# Patient Record
Sex: Male | Born: 1950 | Race: Black or African American | Hispanic: No | Smoking: Current every day smoker
Health system: Southern US, Community
[De-identification: ages and names within clinical notes are randomized; demographics above are authoritative.]

## PROBLEM LIST (undated history)

## (undated) DIAGNOSIS — F0390 Unspecified dementia without behavioral disturbance: Secondary | ICD-10-CM

## (undated) DIAGNOSIS — I10 Essential (primary) hypertension: Secondary | ICD-10-CM

## (undated) DIAGNOSIS — G40909 Epilepsy, unspecified, not intractable, without status epilepticus: Secondary | ICD-10-CM

## (undated) DIAGNOSIS — E119 Type 2 diabetes mellitus without complications: Secondary | ICD-10-CM

---

## 2009-02-11 ENCOUNTER — Emergency Department (HOSPITAL_COMMUNITY): Admission: EM | Admit: 2009-02-11 | Discharge: 2009-02-11 | Payer: Self-pay | Admitting: Family Medicine

## 2009-03-24 ENCOUNTER — Emergency Department (HOSPITAL_COMMUNITY): Admission: EM | Admit: 2009-03-24 | Discharge: 2009-03-24 | Payer: Self-pay | Admitting: Family Medicine

## 2009-08-23 ENCOUNTER — Ambulatory Visit: Payer: Self-pay | Admitting: Gastroenterology

## 2009-09-03 ENCOUNTER — Ambulatory Visit: Payer: Self-pay | Admitting: Gastroenterology

## 2013-01-13 ENCOUNTER — Ambulatory Visit: Payer: Medicare Other | Attending: Internal Medicine | Admitting: Rehabilitation

## 2014-07-23 ENCOUNTER — Encounter: Payer: Self-pay | Admitting: Gastroenterology

## 2014-08-11 ENCOUNTER — Encounter: Payer: Self-pay | Admitting: Gastroenterology

## 2014-10-22 DIAGNOSIS — M160 Bilateral primary osteoarthritis of hip: Secondary | ICD-10-CM | POA: Insufficient documentation

## 2015-05-25 ENCOUNTER — Encounter (HOSPITAL_COMMUNITY): Payer: Self-pay | Admitting: Emergency Medicine

## 2015-05-25 ENCOUNTER — Inpatient Hospital Stay (HOSPITAL_COMMUNITY)
Admission: EM | Admit: 2015-05-25 | Discharge: 2015-05-27 | DRG: 101 | Attending: Internal Medicine | Admitting: Internal Medicine

## 2015-05-25 ENCOUNTER — Observation Stay (HOSPITAL_COMMUNITY)

## 2015-05-25 ENCOUNTER — Inpatient Hospital Stay (HOSPITAL_COMMUNITY)
Admit: 2015-05-25 | Discharge: 2015-05-25 | Disposition: A | Discharge status: Home / Self Care | Attending: Internal Medicine | Admitting: Internal Medicine

## 2015-05-25 ENCOUNTER — Emergency Department (HOSPITAL_COMMUNITY)

## 2015-05-25 DIAGNOSIS — R410 Disorientation, unspecified: Secondary | ICD-10-CM | POA: Insufficient documentation

## 2015-05-25 DIAGNOSIS — I10 Essential (primary) hypertension: Secondary | ICD-10-CM | POA: Diagnosis present

## 2015-05-25 DIAGNOSIS — Z8673 Personal history of transient ischemic attack (TIA), and cerebral infarction without residual deficits: Secondary | ICD-10-CM

## 2015-05-25 DIAGNOSIS — E876 Hypokalemia: Secondary | ICD-10-CM | POA: Diagnosis present

## 2015-05-25 DIAGNOSIS — F17210 Nicotine dependence, cigarettes, uncomplicated: Secondary | ICD-10-CM | POA: Diagnosis present

## 2015-05-25 DIAGNOSIS — R569 Unspecified convulsions: Secondary | ICD-10-CM | POA: Diagnosis not present

## 2015-05-25 DIAGNOSIS — F4323 Adjustment disorder with mixed anxiety and depressed mood: Secondary | ICD-10-CM | POA: Diagnosis present

## 2015-05-25 DIAGNOSIS — K118 Other diseases of salivary glands: Secondary | ICD-10-CM | POA: Diagnosis present

## 2015-05-25 DIAGNOSIS — E119 Type 2 diabetes mellitus without complications: Secondary | ICD-10-CM | POA: Diagnosis present

## 2015-05-25 DIAGNOSIS — E538 Deficiency of other specified B group vitamins: Secondary | ICD-10-CM | POA: Diagnosis present

## 2015-05-25 DIAGNOSIS — G9340 Encephalopathy, unspecified: Secondary | ICD-10-CM | POA: Diagnosis present

## 2015-05-25 HISTORY — DX: Essential (primary) hypertension: I10

## 2015-05-25 HISTORY — DX: Type 2 diabetes mellitus without complications: E11.9

## 2015-05-25 LAB — COMPREHENSIVE METABOLIC PANEL
ALT: 11 U/L — ABNORMAL LOW (ref 17–63)
ANION GAP: 8 (ref 5–15)
AST: 16 U/L (ref 15–41)
Albumin: 4.1 g/dL (ref 3.5–5.0)
Alkaline Phosphatase: 65 U/L (ref 38–126)
BUN: 16 mg/dL (ref 6–20)
CHLORIDE: 101 mmol/L (ref 101–111)
CO2: 26 mmol/L (ref 22–32)
CREATININE: 0.95 mg/dL (ref 0.61–1.24)
Calcium: 9 mg/dL (ref 8.9–10.3)
GFR calc non Af Amer: >60 mL/min (ref >60)
Glucose, Bld: 177 mg/dL — ABNORMAL HIGH (ref 65–99)
Potassium: 4.3 mmol/L (ref 3.5–5.1)
Sodium: 135 mmol/L (ref 135–145)
Total Bilirubin: 0.6 mg/dL (ref 0.3–1.2)
Total Protein: 7.8 g/dL (ref 6.5–8.1)

## 2015-05-25 LAB — CBG MONITORING, ED: GLUCOSE-CAPILLARY: 167 mg/dL — AB (ref 65–99)

## 2015-05-25 LAB — CBC
HCT: 41.5 % (ref 39.0–52.0)
Hemoglobin: 14.1 g/dL (ref 13.0–17.0)
MCH: 32.3 pg (ref 26.0–34.0)
MCHC: 34 g/dL (ref 30.0–36.0)
MCV: 95 fL (ref 78.0–100.0)
PLATELETS: 190 10*3/uL (ref 150–400)
RBC: 4.37 MIL/uL (ref 4.22–5.81)
RDW: 11.9 % (ref 11.5–15.5)
WBC: 4.2 10*3/uL (ref 4.0–10.5)

## 2015-05-25 LAB — PHOSPHORUS: Phosphorus: 2.8 mg/dL (ref 2.5–4.6)

## 2015-05-25 LAB — GLUCOSE, CAPILLARY
GLUCOSE-CAPILLARY: 103 mg/dL — AB (ref 65–99)
Glucose-Capillary: 123 mg/dL — ABNORMAL HIGH (ref 65–99)
Glucose-Capillary: 116 mg/dL — ABNORMAL HIGH (ref 65–99)

## 2015-05-25 LAB — RAPID URINE DRUG SCREEN, HOSP PERFORMED
Amphetamines: NOT DETECTED
Barbiturates: NOT DETECTED
Benzodiazepines: NOT DETECTED
Cocaine: NOT DETECTED
Opiates: NOT DETECTED
Tetrahydrocannabinol: NOT DETECTED

## 2015-05-25 LAB — MAGNESIUM: Magnesium: 1.8 mg/dL (ref 1.7–2.4)

## 2015-05-25 LAB — MRSA PCR SCREENING: MRSA by PCR: NEGATIVE

## 2015-05-25 MED ORDER — GUAIFENESIN-DM 100-10 MG/5ML PO SYRP: 5.0000 mL | ORAL_SOLUTION | ORAL | Status: DC | PRN

## 2015-05-25 MED ORDER — ONDANSETRON HCL 4 MG/2ML IJ SOLN
4.0000 mg | Freq: Four times a day (QID) | INTRAMUSCULAR | Status: DC | PRN
Start: 2015-05-25 — End: 2015-05-27

## 2015-05-25 MED ORDER — CETYLPYRIDINIUM CHLORIDE 0.05 % MT LIQD
7.0000 mL | Freq: Two times a day (BID) | OROMUCOSAL | Status: DC
  Administered 2015-05-25 – 2015-05-27 (×5): 7 mL via OROMUCOSAL

## 2015-05-25 MED ORDER — ALBUTEROL SULFATE (2.5 MG/3ML) 0.083% IN NEBU: 2.5000 mg | INHALATION_SOLUTION | RESPIRATORY_TRACT | Status: DC | PRN

## 2015-05-25 MED ORDER — HYDRALAZINE HCL 20 MG/ML IJ SOLN
10.0000 mg | Freq: Four times a day (QID) | INTRAMUSCULAR | Status: DC | PRN
  Administered 2015-05-25 – 2015-05-26 (×2): 10 mg via INTRAVENOUS
  Filled 2015-05-25 (×2): qty 1

## 2015-05-25 MED ORDER — HEPARIN SODIUM (PORCINE) 5000 UNIT/ML IJ SOLN
5000.0000 [IU] | Freq: Three times a day (TID) | INTRAMUSCULAR | Status: DC
  Administered 2015-05-25 – 2015-05-27 (×7): 5000 [IU] via SUBCUTANEOUS
  Filled 2015-05-25 (×6): qty 1

## 2015-05-25 MED ORDER — ONDANSETRON HCL 4 MG PO TABS: 4.0000 mg | ORAL_TABLET | Freq: Four times a day (QID) | ORAL | Status: DC | PRN

## 2015-05-25 MED ORDER — SODIUM CHLORIDE 0.9 % IV SOLN
INTRAVENOUS | Status: DC
  Administered 2015-05-25: 12:00:00 via INTRAVENOUS

## 2015-05-25 MED ORDER — GADOBENATE DIMEGLUMINE 529 MG/ML IV SOLN
20.0000 mL | Freq: Once | INTRAVENOUS | Status: AC | PRN
Start: 2015-05-25 — End: 2015-05-25
  Administered 2015-05-25: 18 mL via INTRAVENOUS

## 2015-05-25 MED ORDER — INSULIN ASPART 100 UNIT/ML ~~LOC~~ SOLN
0.0000 [IU] | Freq: Three times a day (TID) | SUBCUTANEOUS | Status: DC
  Administered 2015-05-25 – 2015-05-26 (×2): 1 [IU] via SUBCUTANEOUS

## 2015-05-25 MED ORDER — AMLODIPINE BESYLATE 5 MG PO TABS
5.0000 mg | ORAL_TABLET | Freq: Every day | ORAL | Status: DC
  Administered 2015-05-25: 5 mg via ORAL
  Filled 2015-05-25: qty 1

## 2015-05-25 MED ORDER — ACETAMINOPHEN 325 MG PO TABS: 650.0000 mg | ORAL_TABLET | Freq: Four times a day (QID) | ORAL | Status: DC | PRN

## 2015-05-25 MED ORDER — ACETAMINOPHEN 650 MG RE SUPP: 650.0000 mg | Freq: Four times a day (QID) | RECTAL | Status: DC | PRN

## 2015-05-25 MED ORDER — LORAZEPAM 2 MG/ML IJ SOLN: 1.0000 mg | INTRAMUSCULAR | Status: DC | PRN

## 2015-05-25 NOTE — Care Management Note (Signed)
Case Management Note  Patient Details  Name: Sosuke Cortright MRN: [id number] Date of Birth: February 03, 1951  Subjective/Objective:                 Seizures unknown cause   Action/Plan:  [facility]   Expected Discharge Date:   (unknown)         [id number]      Expected Discharge Plan:  [facility]  In-House Referral:     Discharge planning Services  CM Consult  Post Acute Care Choice:  NA Choice offered to:  NA  DME Arranged:  N/A DME Agency:  NA  HH Arranged:  NA HH Agency:  NA  Status of Service:  In process, will continue to follow  Medicare Important Message Given:    Date Medicare IM Given:    Medicare IM give by:    Date Additional Medicare IM Given:    Additional Medicare Important Message give by:     If discussed at Long Length of Stay Meetings, dates discussed:    Additional Comments:  Golda Acre, RN 05/25/2015, 11:53 AM

## 2015-05-25 NOTE — Progress Notes (Signed)
EEG completed, results pending. 

## 2015-05-25 NOTE — Procedures (Signed)
ELECTROENCEPHALOGRAM REPORT   Patient: Philip Robinson       Room #: KZ6010 EEG No. ID: [id number] Age: 64 y.o.        Sex: male Referring Physician: Ghimire Report Date:  05/25/2015        Interpreting Physician: Thana Farr  History: [name] is an 64 y.o. male with new onset seizure and continued confusion  Medications:  Scheduled: . amLODipine  5 mg Oral Daily  . antiseptic oral rinse  7 mL Mouth Rinse BID  . heparin  5,000 Units Subcutaneous 3 times per day  . insulin aspart  0-9 Units Subcutaneous TID WC    Conditions of Recording:  This is a 16 channel EEG carried out with the patient in the awake and drowsy states.  Description:  The waking background activity consists of a low voltage, symmetrical, fairly well organized, 10 Hz alpha activity, seen from the parieto-occipital and posterior temporal regions.  Low voltage fast activity, poorly organized, is seen anteriorly and is at times superimposed on more posterior regions.  A mixture of theta and alpha rhythms are seen from the central and temporal regions. The patient drowses with slowing to irregular, low voltage theta and beta activity.   Stage II sleep is not obtained. Hyperventilation was not performed.  Intermittent photic stimulation was performed but failed to illicit any change in the tracing.    IMPRESSION: Normal electroencephalogram, awake, drowsy and with activation procedures. There are no focal lateralizing or epileptiform features.   Thana Farr, MD [facility] [phone] 05/25/2015, 3:20 PM

## 2015-05-25 NOTE — Progress Notes (Signed)
Date:  May 25, 2015 U.R. performed for needs and level of care. Will continue to follow for Case Management needs.  Vanna Shavers, RN, BSN, CCM   [phone] 

## 2015-05-25 NOTE — H&P (Signed)
PATIENT DETAILS Name: Philip Robinson Age: 64 y.o. Sex: male Date of Birth: 1951-10-14 Admit Date: 05/25/2015 PCP:No primary care provider on file. Referring Physician:Dr Molpus   CHIEF COMPLAINT:  Seizure   HPI: Philip Robinson is a 64 y.o. male with a Past Medical History of DM and HTN with no medications who presents today after being found seizing by another inmate. Guards found him in a post ictal state and foaming at the mouth. He has been in jail since 05/08/15 for no showing his court date. He stated he did not feel well this morning but was unable to describe the feeling. He denied pain. He is still confused and had difficulty remembering his own age. He was unable to state his address or his children's phone numbers. He denies having a personal or family history of seizures. He denies head trauma or any changes in medications. He is on no current medications and denies using recreational drugs.    ALLERGIES:  No Known Allergies  PAST MEDICAL HISTORY: Past Medical History  Diagnosis Date  . Hypertension   . Diabetes mellitus without complication   On no current medications  PAST SURGICAL HISTORY: History reviewed. No pertinent past surgical history.  MEDICATIONS AT HOME: Prior to Admission medications   Not on File    FAMILY HISTORY:  Denies FH of seizures. Denies family hx of CAD  SOCIAL HISTORY:  reports that he has been smoking.  He has never used smokeless tobacco. He reports that he does not drink alcohol or use illicit drugs. Lives at: [facility] since 05/08/15 Mobility: [place]  REVIEW OF SYSTEMS: limited due to confusion  HEENT:    No headaches; + dizziness  GI:  Nausea and vomiting while in ED. Last BM 05/23/15.  GU:  Urinated on self in ED  Psych: Thinks he drove himself to the hospital in his own car. Memory distortion.    PHYSICAL EXAM: Blood pressure 225/154, pulse 89, temperature 98.6 F (37 C), temperature source Oral, resp. rate  20, height 5\' 11"  (1.803 m), weight 93.5 kg (206 lb 2.1 oz), SpO2 100 %.  General appearance :Awake, alert, not in any distress. Speech Clear. Not toxic Looking HEENT: Atraumatic. Encephalopathy. Tongue laceration. Neck: supple, no JVD. No cervical lymphadenopathy.  Chest:Good air entry bilaterally, no added sounds  CVS: S1 S2 regular, no murmurs.  Abdomen: Bowel sounds present, Non tender and not distended with no gaurding, rigidity or rebound. Extremities: B/L Lower Ext shows no edema, both legs are warm to touch Neurology:  Non focal; confused Skin:No Rash Wounds:N/A  LABS ON ADMISSION:   Recent Labs  05/25/15 0248  NA 135  K 4.3  CL 101  CO2 26  GLUCOSE 177*  BUN 16  CREATININE 0.95  CALCIUM 9.0    Recent Labs  05/25/15 0248  AST 16  ALT 11*  ALKPHOS 65  BILITOT 0.6  PROT 7.8  ALBUMIN 4.1   No results for input(s): LIPASE, AMYLASE in the last 72 hours.  Recent Labs  05/25/15 0248  WBC 4.2  HGB 14.1  HCT 41.5  MCV 95.0  PLT 190    RADIOLOGIC STUDIES ON ADMISSION: Dg Chest 1 View  05/25/2015   CLINICAL DATA:  Seizure this morning.  Pre MRI clearance.  Cough.  EXAM: CHEST  1 VIEW  COMPARISON:  10/19/2010  FINDINGS: The cardiomediastinal silhouette is unremarkable.  There is no evidence of focal airspace disease, pulmonary edema, suspicious pulmonary nodule/mass, pleural effusion,  or pneumothorax. No acute bony abnormalities are identified.  No pacemaker or metallic foreign bodies are identified.  IMPRESSION: No active disease.  No pacemaker or unexpected metallic foreign bodies identified.   Electronically Signed   By: Harmon Pier M.D.   On: 05/25/2015 08:43   Dg Abd 1 View  05/25/2015   CLINICAL DATA:  Seizure, nausea, vomiting, coughing, history hypertension, diabetes mellitus  EXAM: ABDOMEN - 1 VIEW  COMPARISON:  None  FINDINGS: Normal bowel gas pattern.  No bowel dilatation or bowel wall thickening.  Bones appear demineralized with degenerative disc disease  changes lumbar spine.  No urinary tract calcification.  IMPRESSION: Normal bowel gas pattern.   Electronically Signed   By: Ulyses Southward M.D.   On: 05/25/2015 08:43   Ct Head Wo Contrast   (if New Onset Seizure And/or Head Trauma)  05/25/2015   CLINICAL DATA:  Acute onset of seizure-like activity. Patient postictal and foaming at the mouth. Initial encounter.  EXAM: CT HEAD WITHOUT CONTRAST  TECHNIQUE: Contiguous axial images were obtained from the base of the skull through the vertex without intravenous contrast.  COMPARISON:  None.  FINDINGS: There is no evidence of acute infarction, mass lesion, or intra- or extra-axial hemorrhage on CT.  Mild cerebellar atrophy is noted. Scattered periventricular and subcortical white matter change likely reflects small vessel ischemic microangiopathy. Small chronic lacunar infarcts are seen at the basal ganglia bilaterally.  The brainstem and fourth ventricle are within normal limits. The cerebral hemispheres demonstrate grossly normal gray-white differentiation. No mass effect or midline shift is seen.  There is no evidence of fracture; visualized osseous structures are unremarkable in appearance. The orbits are within normal limits. The paranasal sinuses and mastoid air cells are well-aerated. No significant soft tissue abnormalities are seen.  IMPRESSION: 1. No acute intracranial pathology seen on CT. 2. Scattered small vessel ischemic microangiopathy. 3. Small chronic lacunar infarcts at the basal ganglia bilaterally.   Electronically Signed   By: Roanna Raider M.D.   On: 05/25/2015 03:19   Mr Laqueta Jean ZO Contrast  05/25/2015   CLINICAL DATA:  64 year old diabetic hypertensive male with witnessed seizure-like activity while in jail. Subsequent encounter.  EXAM: MRI HEAD WITHOUT AND WITH CONTRAST  TECHNIQUE: Multiplanar, multiecho pulse sequences of the brain and surrounding structures were obtained without and with intravenous contrast.  CONTRAST:  18mL MULTIHANCE  GADOBENATE DIMEGLUMINE 529 MG/ML IV SOLN  COMPARISON:  05/25/2015 CT.  No comparison MR.  FINDINGS: Exam is motion degraded.  No acute infarct.  Remote small bilateral centrum semiovale, corona radiata, thalamic and basal ganglia infarcts. Remote right cerebellar infarct.  Prominent small vessel disease type changes.  No intracranial hemorrhage.  No evidence of mesial temporal sclerosis.  Mild global atrophy without hydrocephalus.  No MR evidence of herpes encephalitis.  No intracranial mass or abnormal enhancement.  5 x 4 x 10 mm nonspecific right parotid lesion.  Cervical medullary junction, pituitary region, pineal region and orbital structures unremarkable.  IMPRESSION: Exam is motion degraded.  No acute infarct.  Remote small bilateral centrum semiovale, corona radiata, thalamic and basal ganglia infarcts. Remote right cerebellar infarct.  Prominent small vessel disease type changes.  No intracranial hemorrhage.  No evidence of mesial temporal sclerosis.  Mild global atrophy without hydrocephalus.  No intracranial mass or abnormal enhancement.  5 x 4 x 10 mm nonspecific right parotid lesion.   Electronically Signed   By: Lacy Duverney M.D.   On: 05/25/2015 10:28    I  have personally reviewed images of chest xray and the CT head.   EKG: Personally reviewed. Sinus tachycardia at 125bpm, prolonged QT interval at 85ms, lateral leads suggestive of ischemia.  ASSESSMENT AND PLAN: Present on Admission:  Seizure: First episode-MRI neg. Monitor off anti-epileptics. Await EEG and Neuro eval.  Acute encephalopathy Remains confused many hours later inspite of having seizure earlier this am. MRI brain neg-await EEG. Not sure if this is a prolonged post ictal state.   HTN Blood pressure today is 225/154. Patient is on no home medications. Start hydralazine  injection q 6 hrs prn, SBP>180 and amlodipine  PO q day.  Diabetes Continue to monitor CBGs. No insulin currently required.  Further plan  will depend as patient's clinical course evolves and further radiologic and laboratory data become available. Patient will be monitored closely.  Please note above plan was formulated after personal review and summarization of most recent inpatient/outpatient records.  Plan of care will be discussed with neurology consults.  Above noted plan was discussed with patient face to face at bedside, they were in agreement. There was no family at bedside.  CONSULTS: Neuro  DVT Prophylaxis: Heparin  Code Status: Full Code  Disposition Plan:  Discharge back to [facility] jail within 48 hours  Total time: spent 45 minutes.Greater than 50% of this time was spent in counseling, explanation of diagnosis, planning of further management, and coordination of care.  Elenore Paddy PA-S [facility] Pager [phone]  If 7PM-7AM, please contact night-coverage [email or web address] Password [facility] 05/25/2015, 11:20 AM  Attending MD note  Patient was seen, examined,treatment plan was discussed with PA-S.  I have personally reviewed the clinical findings, lab,EKG, imaging studies and management of this patient in detail.I have also reviewed the orders written for this patient which were under my direction. I agree with the documentation, as recorded   Navjot Loera is a 64 y.o. male who presented with first episode of seizure. Remains confused-not sure if this is a prolonged post ictal state. Work up in progress, MRI neg-EEG pending. Neuro consulted.   [facility]

## 2015-05-25 NOTE — ED Notes (Signed)
Patient transported to X-ray 

## 2015-05-25 NOTE — ED Provider Notes (Addendum)
CSN: [id number]     Arrival date & time 05/25/15  3159 History   First MD Initiated Contact with Patient 05/25/15 0341     Chief Complaint  Patient presents with  . Seizure      (Consider location/radiation/quality/duration/timing/severity/associated sxs/prior Treatment) HPI This is a 64 year old male without a known history of seizures. He has been an inmate in the jail since the fourth of this month. He had generalized tonic-clonic activity just prior to arrival witnessed by his cellmate. It was reported foaming at the mouth. He was noted to be postictal by EMS and continued to be confused on arrival. There is no apparent injury.  Past Medical History  Diagnosis Date  . Hypertension   . Diabetes mellitus without complication    No past surgical history on file. No family history on file. History  Substance Use Topics  . Smoking status: Current Every Day Smoker  . Smokeless tobacco: Not on file  . Alcohol Use: No    Review of Systems  Unable to perform ROS   Allergies  Review of patient's allergies indicates no known allergies.  Home Medications   Prior to Admission medications   Not on File   BP 182/114 mmHg  Pulse 86  Temp(Src) 97.8 F (36.6 C) (Oral)  Resp 18  SpO2 100%   Physical Exam  General: Well-developed, well-nourished male in no acute distress; appearance consistent with age of record HENT: normocephalic; atraumatic; left TM normal, right TM obscured by cerumen Eyes: pupils equal, round and reactive to light; extraocular muscles intact Neck: supple Heart: regular rate and rhythm Lungs: clear to auscultation bilaterally Abdomen: soft; nondistended; nontender; no masses or hepatosplenomegaly; bowel sounds present Extremities: No deformity; full range of motion; pulses normal Neurologic: Awake, alert, confused; motor function intact in all extremities and symmetric; no facial droop Skin: Warm and dry Psychiatric: Flat affect    ED Course   Procedures (including critical care time)   MDM   Nursing notes and vitals signs, including pulse oximetry, reviewed.  Summary of this visit's results, reviewed by myself:   EKG Interpretation  Date/Time:  Tuesday May 25 2015 02:37:36 EDT Ventricular Rate:  125 PR Interval:  145 QRS Duration: 85 QT Interval:  353 QTC Calculation: 509 R Axis:   46 Text Interpretation:  Sinus tachycardia Repol abnrm suggests ischemia, lateral leads Prolonged QT interval Baseline wander in lead(s) V6 No previous ECGs available Confirmed by Jye Fariss  MD, Jonny Ruiz ([id number]) on 05/25/2015 4:02:11 AM       Labs:  Results for orders placed or performed during the hospital encounter of 05/25/15 (from the past 24 hour(s))  CBG monitoring, ED     Status: Abnormal   Collection Time: 05/25/15  2:39 AM  Result Value Ref Range   Glucose-Capillary 167 (H) 65 - 99 mg/dL  CBC (if new onset seizures)     Status: None   Collection Time: 05/25/15  2:48 AM  Result Value Ref Range   WBC 4.2 4.0 - 10.5 K/uL   RBC 4.37 4.22 - 5.81 MIL/uL   Hemoglobin 14.1 13.0 - 17.0 g/dL   HCT 29.2 44.6 - 28.6 %   MCV 95.0 78.0 - 100.0 fL   MCH 32.3 26.0 - 34.0 pg   MCHC 34.0 30.0 - 36.0 g/dL   RDW 38.1 77.1 - 16.5 %   Platelets 190 150 - 400 K/uL  Comprehensive metabolic panel     Status: Abnormal   Collection Time: 05/25/15  2:48 AM  Result  Value Ref Range   Sodium 135 135 - 145 mmol/L   Potassium 4.3 3.5 - 5.1 mmol/L   Chloride 101 101 - 111 mmol/L   CO2 26 22 - 32 mmol/L   Glucose, Bld 177 (H) 65 - 99 mg/dL   BUN 16 6 - 20 mg/dL   Creatinine, Ser 0.45 0.61 - 1.24 mg/dL   Calcium 9.0 8.9 - 40.9 mg/dL   Total Protein 7.8 6.5 - 8.1 g/dL   Albumin 4.1 3.5 - 5.0 g/dL   AST 16 15 - 41 U/L   ALT 11 (L) 17 - 63 U/L   Alkaline Phosphatase 65 38 - 126 U/L   Total Bilirubin 0.6 0.3 - 1.2 mg/dL   GFR calc non Af Amer >60 >60 mL/min   GFR calc Af Amer >60 >60 mL/min   Anion gap 8 5 - 15    Imaging Studies: Ct Head Wo  Contrast   (if New Onset Seizure And/or Head Trauma)  05/25/2015   CLINICAL DATA:  Acute onset of seizure-like activity. Patient postictal and foaming at the mouth. Initial encounter.  EXAM: CT HEAD WITHOUT CONTRAST  TECHNIQUE: Contiguous axial images were obtained from the base of the skull through the vertex without intravenous contrast.  COMPARISON:  None.  FINDINGS: There is no evidence of acute infarction, mass lesion, or intra- or extra-axial hemorrhage on CT.  Mild cerebellar atrophy is noted. Scattered periventricular and subcortical white matter change likely reflects small vessel ischemic microangiopathy. Small chronic lacunar infarcts are seen at the basal ganglia bilaterally.  The brainstem and fourth ventricle are within normal limits. The cerebral hemispheres demonstrate grossly normal gray-white differentiation. No mass effect or midline shift is seen.  There is no evidence of fracture; visualized osseous structures are unremarkable in appearance. The orbits are within normal limits. The paranasal sinuses and mastoid air cells are well-aerated. No significant soft tissue abnormalities are seen.  IMPRESSION: 1. No acute intracranial pathology seen on CT. 2. Scattered small vessel ischemic microangiopathy. 3. Small chronic lacunar infarcts at the basal ganglia bilaterally.   Electronically Signed   By: Roanna Raider M.D.   On: 05/25/2015 03:19    3:41 AM Patient more alert, able to answer questions. Answers "I can't really remember offhand" to just about anything asked. He is oriented to person and place. He is unable to recall the names of any medications he has been on in the past. He cannot name the day, month, year or POTUS.   5:47 AM Patient sleeping but readily aroused. Still unable to answer questions coherently. Will have him admitted for further evaluation.   6:48 AM Patient still unable to questions coherently. No focal findings on exam. MRI ordered. Hospitalist admit.  Paula Libra, MD 05/25/15 8119  Paula Libra, MD 05/25/15 [phone]

## 2015-05-25 NOTE — ED Notes (Signed)
EDP notified that MRI unable to do scan due to patient not able to answer questions regarding past medical history/surgeries. [place] Sheriff stated that they were not allowed to call family for history due to inability to notify family that the patient is in the ED.

## 2015-05-25 NOTE — ED Notes (Signed)
Per EMS pt is coming from the jail after having seizure like activity that was witnessed by another inmate  Guards found pt postictal and foaming at the mouth  Pt has no hx of seizures  Pt has hx of hypertension and diabetes   Saline lock was placed by EMS and pt was placed on oxygen

## 2015-05-25 NOTE — ED Notes (Signed)
Patient transported to CT 

## 2015-05-25 NOTE — Consult Note (Signed)
Reason for Consult:Altered mental status, seizure Referring Physician: Ghimire  CC: Altered mental status, seizure  HPI: Philip Robinson is an 64 y.o. male admitted overnight who is unable to provide any reliable history.  All history obtained from the chart.  It appears the patient was brought into the ED after being found seizing by another inmate. Guards found him in a post ictal state and foaming at the mouth. He has been in jail since 05/08/15 for no showing his court date. Despite no further clinical seizure activity he has remained confused.  Does not recall having had a seizure and does not report any history of seizure.    Past Medical History  Diagnosis Date  . Hypertension   . Diabetes mellitus without complication     History reviewed. No pertinent past surgical history.  Family history: Patient unable to provide.    Social History:  reports that he has been smoking.  He has never used smokeless tobacco. He reports that he does not drink alcohol or use illicit drugs.  No Known Allergies  Medications:  I have reviewed the patient's current medications. Prior to Admission:  No prescriptions prior to admission   Scheduled: . amLODipine  5 mg Oral Daily  . antiseptic oral rinse  7 mL Mouth Rinse BID  . heparin  5,000 Units Subcutaneous 3 times per day  . insulin aspart  0-9 Units Subcutaneous TID WC    ROS: History obtained from the patient  General ROS: negative for - chills, fatigue, fever, night sweats, weight gain or weight loss Psychological ROS: negative for - behavioral disorder, hallucinations, memory difficulties, mood swings or suicidal ideation Ophthalmic ROS: negative for - blurry vision, double vision, eye pain or loss of vision ENT ROS: negative for - epistaxis, nasal discharge, oral lesions, sore throat, tinnitus or vertigo Allergy and Immunology ROS: negative for - hives or itchy/watery eyes Hematological and Lymphatic ROS: negative for - bleeding  problems, bruising or swollen lymph nodes Endocrine ROS: negative for - galactorrhea, hair pattern changes, polydipsia/polyuria or temperature intolerance Respiratory ROS: negative for - cough, hemoptysis, shortness of breath or wheezing Cardiovascular ROS: negative for - chest pain, dyspnea on exertion, edema or irregular heartbeat Gastrointestinal ROS: nausea/vomiting earlier today Genito-Urinary ROS: negative for - dysuria, hematuria, incontinence or urinary frequency/urgency Musculoskeletal ROS: negative for - joint swelling or muscular weakness Neurological ROS: as noted in HPI Dermatological ROS: negative for rash and skin lesion changes  Physical Examination: Blood pressure 225/154, pulse 89, temperature 98.7 F (37.1 C), temperature source Oral, resp. rate 20, height  (1.803 m), weight 93.5 kg (206 lb 2.1 oz), SpO2 100 %.  HEENT-  Normocephalic, no lesions, without obvious abnormality.  Normal external eye and conjunctiva.  Normal TM's bilaterally.  Normal auditory canals and external ears. Normal external nose, mucus membranes and septum.  Normal pharynx. Cardiovascular- S1, S2 normal, pulses palpable throughout   Lungs- chest clear, no wheezing, rales, normal symmetric air entry Abdomen- soft, non-tender; bowel sounds normal; no masses,  no organomegaly Extremities- no edema. Amputated small right toe Lymph-no adenopathy palpable Musculoskeletal-no joint tenderness, deformity or swelling Skin-warm and dry, no hyperpigmentation, vitiligo, or suspicious lesions  Neurological Examination Mental Status: Alert.  Knows he is in the hospital but can not tell me which one despite reporting that he lives right down the street.  Can not tell me his address or the year.  Speech fluent without evidence of aphasia.  Able to follow 3 step commands without difficulty. Cranial  Nerves: II: Discs flat bilaterally; Visual fields grossly normal, pupils equal, round, reactive to light and  accommodation III,IV, VI: ptosis not present, extra-ocular motions intact bilaterally V,VII: smile symmetric, facial light touch sensation normal bilaterally VIII: hearing normal bilaterally IX,X: gag reflex present XI: bilateral shoulder shrug XII: midline tongue extension Motor: Right : Upper extremity   5/5    Left:     Upper extremity   5/5  Lower extremity   5/5     Lower extremity   5/5 Tone and bulk:normal tone throughout; no atrophy noted Sensory: Pinprick and light touch intact throughout, bilaterally Deep Tendon Reflexes: 2+ and symmetric throughout Plantars: Right: downgoing   Left: downgoing Cerebellar: normal finger-to-nose and normal heel-to-shin testing bilaterally Gait: gait normal in shackles   Laboratory Studies:   Basic Metabolic Panel:  Recent Labs Lab 05/25/15 0248  NA 135  K 4.3  CL 101  CO2 26  GLUCOSE 177*  BUN 16  CREATININE 0.95  CALCIUM 9.0    Liver Function Tests:  Recent Labs Lab 05/25/15 0248  AST 16  ALT 11*  ALKPHOS 65  BILITOT 0.6  PROT 7.8  ALBUMIN 4.1   No results for input(s): LIPASE, AMYLASE in the last 168 hours. No results for input(s): AMMONIA in the last 168 hours.  CBC:  Recent Labs Lab 05/25/15 0248  WBC 4.2  HGB 14.1  HCT 41.5  MCV 95.0  PLT 190    Cardiac Enzymes: No results for input(s): CKTOTAL, CKMB, CKMBINDEX, TROPONINI in the last 168 hours.  BNP: Invalid input(s): POCBNP  CBG:  Recent Labs Lab 05/25/15 0239 05/25/15 1147  GLUCAP 167* 123*    Microbiology: Results for orders placed or performed during the hospital encounter of 05/25/15  MRSA PCR Screening     Status: None   Collection Time: 05/25/15 10:52 AM  Result Value Ref Range Status   MRSA by PCR NEGATIVE NEGATIVE Final    Comment:        The GeneXpert MRSA Assay (FDA approved for NASAL specimens only), is one component of a comprehensive MRSA colonization surveillance program. It is not intended to diagnose  MRSA infection nor to guide or monitor treatment for MRSA infections.     Coagulation Studies: No results for input(s): LABPROT, INR in the last 72 hours.  Urinalysis: No results for input(s): COLORURINE, LABSPEC, PHURINE, GLUCOSEU, HGBUR, BILIRUBINUR, KETONESUR, PROTEINUR, UROBILINOGEN, NITRITE, LEUKOCYTESUR in the last 168 hours.  Invalid input(s): APPERANCEUR  Lipid Panel:  No results found for: CHOL, TRIG, HDL, CHOLHDL, VLDL, LDLCALC  HgbA1C: No results found for: HGBA1C  Urine Drug Screen:     Component Value Date/Time   LABOPIA NONE DETECTED 05/25/2015 0710   COCAINSCRNUR NONE DETECTED 05/25/2015 0710   LABBENZ NONE DETECTED 05/25/2015 0710   AMPHETMU NONE DETECTED 05/25/2015 0710   THCU NONE DETECTED 05/25/2015 0710   LABBARB NONE DETECTED 05/25/2015 0710    Alcohol Level: No results for input(s): ETH in the last 168 hours.  Other results: EKG: sinus tachycardia at 125 bpm.  Imaging: Dg Chest 1 View  05/25/2015   CLINICAL DATA:  Seizure this morning.  Pre MRI clearance.  Cough.  EXAM: CHEST  1 VIEW  COMPARISON:  10/19/2010  FINDINGS: The cardiomediastinal silhouette is unremarkable.  There is no evidence of focal airspace disease, pulmonary edema, suspicious pulmonary nodule/mass, pleural effusion, or pneumothorax. No acute bony abnormalities are identified.  No pacemaker or metallic foreign bodies are identified.  IMPRESSION: No active disease.  No pacemaker  or unexpected metallic foreign bodies identified.   Electronically Signed   By: Harmon Pier M.D.   On: 05/25/2015 08:43   Dg Abd 1 View  05/25/2015   CLINICAL DATA:  Seizure, nausea, vomiting, coughing, history hypertension, diabetes mellitus  EXAM: ABDOMEN - 1 VIEW  COMPARISON:  None  FINDINGS: Normal bowel gas pattern.  No bowel dilatation or bowel wall thickening.  Bones appear demineralized with degenerative disc disease changes lumbar spine.  No urinary tract calcification.  IMPRESSION: Normal bowel gas pattern.    Electronically Signed   By: Ulyses Southward M.D.   On: 05/25/2015 08:43   Ct Head Wo Contrast   (if New Onset Seizure And/or Head Trauma)  05/25/2015   CLINICAL DATA:  Acute onset of seizure-like activity. Patient postictal and foaming at the mouth. Initial encounter.  EXAM: CT HEAD WITHOUT CONTRAST  TECHNIQUE: Contiguous axial images were obtained from the base of the skull through the vertex without intravenous contrast.  COMPARISON:  None.  FINDINGS: There is no evidence of acute infarction, mass lesion, or intra- or extra-axial hemorrhage on CT.  Mild cerebellar atrophy is noted. Scattered periventricular and subcortical white matter change likely reflects small vessel ischemic microangiopathy. Small chronic lacunar infarcts are seen at the basal ganglia bilaterally.  The brainstem and fourth ventricle are within normal limits. The cerebral hemispheres demonstrate grossly normal gray-white differentiation. No mass effect or midline shift is seen.  There is no evidence of fracture; visualized osseous structures are unremarkable in appearance. The orbits are within normal limits. The paranasal sinuses and mastoid air cells are well-aerated. No significant soft tissue abnormalities are seen.  IMPRESSION: 1. No acute intracranial pathology seen on CT. 2. Scattered small vessel ischemic microangiopathy. 3. Small chronic lacunar infarcts at the basal ganglia bilaterally.   Electronically Signed   By: Roanna Raider M.D.   On: 05/25/2015 03:19   Mr Laqueta Jean ZO Contrast  05/25/2015   CLINICAL DATA:  64 year old diabetic hypertensive male with witnessed seizure-like activity while in jail. Subsequent encounter.  EXAM: MRI HEAD WITHOUT AND WITH CONTRAST  TECHNIQUE: Multiplanar, multiecho pulse sequences of the brain and surrounding structures were obtained without and with intravenous contrast.  CONTRAST:  18mL MULTIHANCE GADOBENATE DIMEGLUMINE 529 MG/ML IV SOLN  COMPARISON:  05/25/2015 CT.  No comparison MR.   FINDINGS: Exam is motion degraded.  No acute infarct.  Remote small bilateral centrum semiovale, corona radiata, thalamic and basal ganglia infarcts. Remote right cerebellar infarct.  Prominent small vessel disease type changes.  No intracranial hemorrhage.  No evidence of mesial temporal sclerosis.  Mild global atrophy without hydrocephalus.  No MR evidence of herpes encephalitis.  No intracranial mass or abnormal enhancement.  5 x 4 x 10 mm nonspecific right parotid lesion.  Cervical medullary junction, pituitary region, pineal region and orbital structures unremarkable.  IMPRESSION: Exam is motion degraded.  No acute infarct.  Remote small bilateral centrum semiovale, corona radiata, thalamic and basal ganglia infarcts. Remote right cerebellar infarct.  Prominent small vessel disease type changes.  No intracranial hemorrhage.  No evidence of mesial temporal sclerosis.  Mild global atrophy without hydrocephalus.  No intracranial mass or abnormal enhancement.  5 x 4 x 10 mm nonspecific right parotid lesion.   Electronically Signed   By: Lacy Duverney M.D.   On: 05/25/2015 10:28     Assessment/Plan: 64 year old male presenting after his first seizure per his report.  Unable to obtain any significant history from the patient or any  reliable contact information.  Work up to date has included a MRI of the brain.  This has been personally reviewed and shows no acute changes.  Lab work is unremarkable and includes a negative drug screen.  Etiology remains unclear and patient remains altered.  Although this may represent a prolonged post-ictal state further work up recommended.    Recommendations: 1.  EEG 2.  Serum magnesium and phosphorus 3.  Continue seizure precautions 4.  Ativan prn  Thana Farr, MD [facility] [phone] 05/25/2015, 1:37 PM

## 2015-05-25 NOTE — ED Notes (Signed)
Returned from CT.

## 2015-05-25 NOTE — ED Notes (Addendum)
Patient transported to MRI Patient accompanied by [facility] department. Patient remains in bilateral feet handcuffs.

## 2015-05-25 NOTE — ED Notes (Signed)
[facility] deputies x 2 at the bedside. Patient has bilateral leg handcuffs on.

## 2015-05-26 DIAGNOSIS — E876 Hypokalemia: Secondary | ICD-10-CM

## 2015-05-26 DIAGNOSIS — I10 Essential (primary) hypertension: Secondary | ICD-10-CM

## 2015-05-26 DIAGNOSIS — G9340 Encephalopathy, unspecified: Secondary | ICD-10-CM

## 2015-05-26 DIAGNOSIS — R569 Unspecified convulsions: Principal | ICD-10-CM

## 2015-05-26 LAB — BASIC METABOLIC PANEL
Anion gap: 7 (ref 5–15)
BUN: 14 mg/dL (ref 6–20)
CO2: 27 mmol/L (ref 22–32)
CREATININE: 0.89 mg/dL (ref 0.61–1.24)
Calcium: 9.2 mg/dL (ref 8.9–10.3)
Chloride: 105 mmol/L (ref 101–111)
GFR calc Af Amer: >60 mL/min (ref >60)
Glucose, Bld: 113 mg/dL — ABNORMAL HIGH (ref 65–99)
Potassium: 3.4 mmol/L — ABNORMAL LOW (ref 3.5–5.1)
Sodium: 139 mmol/L (ref 135–145)

## 2015-05-26 LAB — SEDIMENTATION RATE: SED RATE: 9 mm/h (ref 0–16)

## 2015-05-26 LAB — TSH: TSH: 1.449 u[IU]/mL (ref 0.350–4.500)

## 2015-05-26 LAB — CBC
HCT: 41.2 % (ref 39.0–52.0)
Hemoglobin: 13.9 g/dL (ref 13.0–17.0)
MCH: 32.3 pg (ref 26.0–34.0)
MCHC: 33.7 g/dL (ref 30.0–36.0)
MCV: 95.6 fL (ref 78.0–100.0)
Platelets: 204 10*3/uL (ref 150–400)
RBC: 4.31 MIL/uL (ref 4.22–5.81)
RDW: 12.1 % (ref 11.5–15.5)
WBC: 6.8 10*3/uL (ref 4.0–10.5)

## 2015-05-26 LAB — GLUCOSE, CAPILLARY
GLUCOSE-CAPILLARY: 117 mg/dL — AB (ref 65–99)
Glucose-Capillary: 129 mg/dL — ABNORMAL HIGH (ref 65–99)
Glucose-Capillary: 98 mg/dL (ref 65–99)
Glucose-Capillary: 134 mg/dL — ABNORMAL HIGH (ref 65–99)

## 2015-05-26 LAB — HEMOGLOBIN A1C
Hgb A1c MFr Bld: 5.9 % — ABNORMAL HIGH (ref 4.8–5.6)
MEAN PLASMA GLUCOSE: 123 mg/dL

## 2015-05-26 LAB — VITAMIN B12: VITAMIN B 12: 192 pg/mL (ref 180–914)

## 2015-05-26 MED ORDER — POTASSIUM CHLORIDE CRYS ER 20 MEQ PO TBCR
40.0000 meq | EXTENDED_RELEASE_TABLET | Freq: Once | ORAL | Status: AC
Start: 2015-05-26 — End: 2015-05-26
  Administered 2015-05-26: 40 meq via ORAL
  Filled 2015-05-26: qty 2

## 2015-05-26 MED ORDER — HYDRALAZINE HCL 20 MG/ML IJ SOLN
10.0000 mg | INTRAMUSCULAR | Status: DC | PRN
  Administered 2015-05-27: 10 mg via INTRAVENOUS
  Filled 2015-05-26: qty 1

## 2015-05-26 MED ORDER — HYDROCHLOROTHIAZIDE 25 MG PO TABS
25.0000 mg | ORAL_TABLET | Freq: Every day | ORAL | Status: DC
  Administered 2015-05-26 – 2015-05-27 (×2): 25 mg via ORAL
  Filled 2015-05-26 (×2): qty 1

## 2015-05-26 MED ORDER — AMLODIPINE BESYLATE 10 MG PO TABS
10.0000 mg | ORAL_TABLET | Freq: Every day | ORAL | Status: DC
  Administered 2015-05-26 – 2015-05-27 (×2): 10 mg via ORAL
  Filled 2015-05-26 (×2): qty 1

## 2015-05-26 MED ORDER — AMLODIPINE BESYLATE 10 MG PO TABS: 10.0000 mg | ORAL_TABLET | Freq: Every day | ORAL | Status: DC

## 2015-05-26 MED ORDER — METOPROLOL TARTRATE 25 MG PO TABS
25.0000 mg | ORAL_TABLET | Freq: Two times a day (BID) | ORAL | Status: DC
  Administered 2015-05-26 – 2015-05-27 (×3): 25 mg via ORAL
  Filled 2015-05-26 (×3): qty 1

## 2015-05-26 NOTE — Progress Notes (Signed)
Subjective: Patient without further seizure activity.  Continues to have short term memory issues but appears better than yesterday.  Unclear if this is his baseline but there is no one to contact for further information.    Objective: Current vital signs: BP 179/123 mmHg  Pulse 90  Temp(Src) 98.5 F (36.9 C) (Oral)  Resp 12  Ht 5' 11"  (1.803 m)  Wt 93.5 kg (206 lb 2.1 oz)  BMI 28.76 kg/m2  SpO2 100% Vital signs in last 24 hours: Temp:  [98.1 F (36.7 C)-98.7 F (37.1 C)] 98.5 F (36.9 C) (06/22 0800) Pulse Rate:  [69-90] 90 (06/22 1000) Resp:  [11-24] 12 (06/22 0700) BP: (151-209)/(76-125) 179/123 mmHg (06/22 1000) SpO2:  [93 %-100 %] 100 % (06/22 1000)  Intake/Output from previous day: 06/21 0701 - 06/22 0700 In: 705 [P.O.:240; I.V.:465] Out: 1 [Urine:1900] Intake/Output this shift: Total I/O In: -  Out: 550 [Urine:550] Nutritional status: Diet heart healthy/carb modified Room service appropriate?: Yes; Fluid consistency:: Thin  Neurologic Exam: Mental Status: Alert and awake.  Does not know the year or the month but can tell me that he is in a hospital in [place].  Does not know which hospital.  Repots that he has 6 children and tells me where they live.  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  Also reports that he has multiple grandchildren and runs a [employer].  Unclear if he may be confabulatory.  Still can not tell me his address but reports it is because he has 4 addresses and 4 ex-wives.  Speech fluent without evidence of aphasia. Able to follow 3 step commands without difficulty. Cranial Nerves: II: Discs flat bilaterally; Visual fields grossly normal, pupils equal, round, reactive to light and accommodation III,IV, VI: ptosis not present, extra-ocular motions intact bilaterally V,VII: smile symmetric, facial light touch sensation normal bilaterally VIII: hearing normal  bilaterally IX,X: gag reflex present XI: bilateral shoulder shrug XII: midline tongue extension Motor: Right :Upper extremity 5/5Left: Upper extremity 5/5 Lower extremity 5/5Lower extremity 5/5 Tone and bulk:normal tone throughout; no atrophy noted Sensory: Pinprick and light touch intact throughout, bilaterally Deep Tendon Reflexes: 2+ and symmetric throughout   Lab Results: Basic Metabolic Panel:  Recent Labs Lab 05/25/15 0248 05/25/15 1450 05/26/15 0332  NA 135  --  139  K 4.3  --  3.4*  CL 101  --  105  CO2 26  --  27  GLUCOSE 177*  --  113*  BUN 16  --  14  CREATININE 0.95  --  0.89  CALCIUM 9.0  --  9.2  MG  --  1.8  --   PHOS  --  2.8  --     Liver Function Tests:  Recent Labs Lab 05/25/15 0248  AST 16  ALT 11*  ALKPHOS 65  BILITOT 0.6  PROT 7.8  ALBUMIN 4.1   No results for input(s): LIPASE, AMYLASE in the last 168 hours. No results for input(s): AMMONIA in the last 168 hours.  CBC:  Recent Labs Lab 05/25/15 0248 05/26/15 0332  WBC 4.2 6.8  HGB 14.1 13.9  HCT 41.5 41.2  MCV 95.0 95.6  PLT 190 204    Cardiac Enzymes: No results for input(s): CKTOTAL, CKMB, CKMBINDEX, TROPONINI in the last 168 hours.  Lipid Panel: No results for input(s): CHOL, TRIG, HDL, CHOLHDL, VLDL, LDLCALC in the last 168 hours.  CBG:  Recent  Labs Lab 05/25/15 0239 05/25/15 1147 05/25/15 1600 05/25/15 2139 05/26/15 0811  GLUCAP 167* 123* 103* 116* 117*    Microbiology: Results for orders placed or performed during the hospital encounter of 05/25/15  MRSA PCR Screening     Status: None   Collection Time: 05/25/15 10:52 AM  Result Value Ref Range Status   MRSA by PCR NEGATIVE NEGATIVE Final    Comment:        The GeneXpert MRSA Assay (FDA approved for NASAL specimens only), is one component of a comprehensive MRSA colonization surveillance  program. It is not intended to diagnose MRSA infection nor to guide or monitor treatment for MRSA infections.     Coagulation Studies: No results for input(s): LABPROT, INR in the last 72 hours.  Imaging: Dg Chest 1 View  05/25/2015   CLINICAL DATA:  Seizure this morning.  Pre MRI clearance.  Cough.  EXAM: CHEST  1 VIEW  COMPARISON:  10/19/2010  FINDINGS: The cardiomediastinal silhouette is unremarkable.  There is no evidence of focal airspace disease, pulmonary edema, suspicious pulmonary nodule/mass, pleural effusion, or pneumothorax. No acute bony abnormalities are identified.  No pacemaker or metallic foreign bodies are identified.  IMPRESSION: No active disease.  No pacemaker or unexpected metallic foreign bodies identified.   Electronically Signed   By: Margarette Canada M.D.   On: 05/25/2015 08:43   Dg Abd 1 View  05/25/2015   CLINICAL DATA:  Seizure, nausea, vomiting, coughing, history hypertension, diabetes mellitus  EXAM: ABDOMEN - 1 VIEW  COMPARISON:  None  FINDINGS: Normal bowel gas pattern.  No bowel dilatation or bowel wall thickening.  Bones appear demineralized with degenerative disc disease changes lumbar spine.  No urinary tract calcification.  IMPRESSION: Normal bowel gas pattern.   Electronically Signed   By: Lavonia Dana M.D.   On: 05/25/2015 08:43   Ct Head Wo Contrast   (if New Onset Seizure And/or Head Trauma)  05/25/2015   CLINICAL DATA:  Acute onset of seizure-like activity. Patient postictal and foaming at the mouth. Initial encounter.  EXAM: CT HEAD WITHOUT CONTRAST  TECHNIQUE: Contiguous axial images were obtained from the base of the skull through the vertex without intravenous contrast.  COMPARISON:  None.  FINDINGS: There is no evidence of acute infarction, mass lesion, or intra- or extra-axial hemorrhage on CT.  Mild cerebellar atrophy is noted. Scattered periventricular and subcortical white matter change likely reflects small vessel ischemic microangiopathy. Small  chronic lacunar infarcts are seen at the basal ganglia bilaterally.  The brainstem and fourth ventricle are within normal limits. The cerebral hemispheres demonstrate grossly normal gray-white differentiation. No mass effect or midline shift is seen.  There is no evidence of fracture; visualized osseous structures are unremarkable in appearance. The orbits are within normal limits. The paranasal sinuses and mastoid air cells are well-aerated. No significant soft tissue abnormalities are seen.  IMPRESSION: 1. No acute intracranial pathology seen on CT. 2. Scattered small vessel ischemic microangiopathy. 3. Small chronic lacunar infarcts at the basal ganglia bilaterally.   Electronically Signed   By: Garald Balding M.D.   On: 05/25/2015 03:19   Mr Jeri Cos NL Contrast  05/25/2015   CLINICAL DATA:  64 year old diabetic hypertensive male with witnessed seizure-like activity while in jail. Subsequent encounter.  EXAM: MRI HEAD WITHOUT AND WITH CONTRAST  TECHNIQUE: Multiplanar, multiecho pulse sequences of the brain and surrounding structures were obtained without and with intravenous contrast.  CONTRAST:  103mL MULTIHANCE GADOBENATE DIMEGLUMINE 529 MG/ML IV SOLN  COMPARISON:  05/25/2015 CT.  No comparison MR.  FINDINGS: Exam is motion degraded.  No acute infarct.  Remote small bilateral centrum semiovale, corona radiata, thalamic and basal ganglia infarcts. Remote right cerebellar infarct.  Prominent small vessel disease type changes.  No intracranial hemorrhage.  No evidence of mesial temporal sclerosis.  Mild global atrophy without hydrocephalus.  No MR evidence of herpes encephalitis.  No intracranial mass or abnormal enhancement.  5 x 4 x 10 mm nonspecific right parotid lesion.  Cervical medullary junction, pituitary region, pineal region and orbital structures unremarkable.  IMPRESSION: Exam is motion degraded.  No acute infarct.  Remote small bilateral centrum semiovale, corona radiata, thalamic and basal ganglia  infarcts. Remote right cerebellar infarct.  Prominent small vessel disease type changes.  No intracranial hemorrhage.  No evidence of mesial temporal sclerosis.  Mild global atrophy without hydrocephalus.  No intracranial mass or abnormal enhancement.  5 x 4 x 10 mm nonspecific right parotid lesion.   Electronically Signed   By: Genia Del M.D.   On: 05/25/2015 10:28    Medications:  I have reviewed the patient's current medications. Scheduled: . amLODipine  10 mg Oral Daily  . antiseptic oral rinse  7 mL Mouth Rinse BID  . heparin  5,000 Units Subcutaneous 3 times per day  . insulin aspart  0-9 Units Subcutaneous TID WC    Assessment/Plan: Patient without further seizure activity.  MRI of the brain personally reviewed and shows chronic small vessel disease but no acute changes.  No evidence of PRES.  EEG shows no epileptiform activity.  Patient's mental status appears confabulatory.  Unclear if this is his baseline.  At this time I have no one to contact about his baseline.  BP elevated.    Recommendations: 1.  BP control 2.  Consider follow up on an outpatient basis for dementia testing.   3.  TSH, B12, ESR, RPR 4.  Anticonvulsant therapy not indicated at this time.   5.  Continue seizure precautions   LOS: 1 day   Alexis Goodell, MD [facility] [phone] 05/26/2015  11:38 AM

## 2015-05-26 NOTE — Progress Notes (Signed)
RN continues to assess patients orientation status, as patient told night nurse that he is pretending to be confused so that he doesn't have to go back to jail. RN asked patient what the month was and he said he didn't know. She asked if he knew the season and he said summer. She asked if he knew who the president was. He said no. I asked does he know if it is a man or a woman. He said that he didn't have a clue. And then he stated "I have no idea what Obama is doing."

## 2015-05-26 NOTE — Progress Notes (Signed)
PROGRESS NOTE    Mousa Prout TMH:[id number] DOB: 28-Oct-1951 DOA: 05/25/2015 PCP: No primary care provider on file.  HPI/Brief narrative 64 y.o. male with h/o HTN on no medications (since being incarcerated 05/08/15) presented after being found seizing by another inmate. Guards found him in a post ictal state and foaming at the mouth. He has been in jail since 05/08/15 for no showing his court date. CT and MRI Brain: no acute findings. EEG normal. Prolonged post ictal state. Neurology consulted.  Assessment/Plan:  Present on Admission:  Seizure: - Neurology consulted. - CT and MRI brain: No acute findings. EEG: No seizures - No further seizures since hospitalization. Patient denies family history or personal prior history of seizures - As per neurology, no AEDs indicated at this time - Patient counseled extensively regarding no driving, operating heavy machinery, being at heights or in standing water- until cleared by outpatient physician. He verbalized understanding - Post ictal confusion has significantly improved. Baseline mental status not known. - UDS negative  Acute encephalopathy - Likely secondary to postictal state. Improved but may have some confusion or forgetfulness. Baseline mental status not known. - As per police personnel regarding patient in the hospital, we are not supposed to call his family for security concerns.  - TSH normal, UDS negative, B12: 192, ESR 9 - ? Related to uncontrolled hypertension-felt less likely.  Uncontrolled HTN, malignant - Patient presented with BP 225/154.  - Patient states that he has not been getting any medications since incarceration on 05/08/15. He states that he was on several medications but cannot recollect any names or the pharmacy that he filled these prescriptions form. - As stated above, police personnel advise against calling patient's spouse (separated) regarding patient hospitalization or details. - Started on amlodipine 10 MG  daily. Added metoprolol 25 MG twice a day and HCTZ 25 MG daily. Monitor and may have to make adjustments - Continue when necessary IV hydralazine  Hypokalemia - Replace and follow  ?Diabetes Patient denies history of diabetes. A1c 5.9   Right Parotid lesion - Seen on MRI brain. Outpatient follow-up as deemed necessary  Old strokes - Seen on CT and MRI brain   Code Status: Full  Family Communication: None at bedside  Disposition Plan: DC to jail when medically stable   Consultants:  Neurology  Procedures:  EEG 05/25/2015: IMPRESSION: Normal electroencephalogram, awake, drowsy and with activation procedures. There are no focal lateralizing or epileptiform features.  Antibiotics:  None   Subjective: Patient denies complaints. Denies prior personal history or family history of seizures. Has no recollection of event that brought him to the hospital. No chest pain, dyspnea, headache.   Objective: Filed Vitals:   05/26/15 1000 05/26/15 1100 05/26/15 1200 05/26/15 1242  BP: 179/123 167/96 164/118 187/103  Pulse: 90 79 99 86  Temp:   98.5 F (36.9 C)   TempSrc:   Oral   Resp:  20 22   Height:      Weight:      SpO2: 100% 100% 100%     Intake/Output Summary (Last 24 hours) at 05/26/15 1536 Last data filed at 05/26/15 1054  Gross per 24 hour  Intake    225 ml  Output   2450 ml  Net  -2225 ml   Filed Weights   05/25/15 1035  Weight: 93.5 kg (206 lb 2.1 oz)     Exam:  General exam: Moderately built and nourished pleasant male lying comfortably supine in bed. Respiratory system: Clear. No increased  work of breathing. Cardiovascular system: S1 & S2 heard, RRR. No JVD, murmurs, gallops, clicks or pedal edema. telemetry: Sinus rhythm  Gastrointestinal system: Abdomen is nondistended, soft and nontender. Normal bowel sounds heard. Central nervous system: Alert and oriented3 . No focal neurological deficits. Extremities: Symmetric 5 x 5 power.Bilateral legs  shackled to the bed.   Data Reviewed: Basic Metabolic Panel:  Recent Labs Lab 05/25/15 0248 05/25/15 1450 05/26/15 0332  NA 135  --  139  K 4.3  --  3.4*  CL 101  --  105  CO2 26  --  27  GLUCOSE 177*  --  113*  BUN 16  --  14  CREATININE 0.95  --  0.89  CALCIUM 9.0  --  9.2  MG  --  1.8  --   PHOS  --  2.8  --    Liver Function Tests:  Recent Labs Lab 05/25/15 0248  AST 16  ALT 11*  ALKPHOS 65  BILITOT 0.6  PROT 7.8  ALBUMIN 4.1   No results for input(s): LIPASE, AMYLASE in the last 168 hours. No results for input(s): AMMONIA in the last 168 hours. CBC:  Recent Labs Lab 05/25/15 0248 05/26/15 0332  WBC 4.2 6.8  HGB 14.1 13.9  HCT 41.5 41.2  MCV 95.0 95.6  PLT 190 204   Cardiac Enzymes: No results for input(s): CKTOTAL, CKMB, CKMBINDEX, TROPONINI in the last 168 hours. BNP (last 3 results) No results for input(s): PROBNP in the last 8760 hours. CBG:  Recent Labs Lab 05/25/15 1147 05/25/15 1600 05/25/15 2139 05/26/15 0811 05/26/15 1207  GLUCAP 123* 103* 116* 117* 129*    Recent Results (from the past 240 hour(s))  MRSA PCR Screening     Status: None   Collection Time: 05/25/15 10:52 AM  Result Value Ref Range Status   MRSA by PCR NEGATIVE NEGATIVE Final    Comment:        The GeneXpert MRSA Assay (FDA approved for NASAL specimens only), is one component of a comprehensive MRSA colonization surveillance program. It is not intended to diagnose MRSA infection nor to guide or monitor treatment for MRSA infections.            Studies: Dg Chest 1 View  05/25/2015   CLINICAL DATA:  Seizure this morning.  Pre MRI clearance.  Cough.  EXAM: CHEST  1 VIEW  COMPARISON:  10/19/2010  FINDINGS: The cardiomediastinal silhouette is unremarkable.  There is no evidence of focal airspace disease, pulmonary edema, suspicious pulmonary nodule/mass, pleural effusion, or pneumothorax. No acute bony abnormalities are identified.  No pacemaker or  metallic foreign bodies are identified.  IMPRESSION: No active disease.  No pacemaker or unexpected metallic foreign bodies identified.   Electronically Signed   By: Margarette Canada M.D.   On: 05/25/2015 08:43   Dg Abd 1 View  05/25/2015   CLINICAL DATA:  Seizure, nausea, vomiting, coughing, history hypertension, diabetes mellitus  EXAM: ABDOMEN - 1 VIEW  COMPARISON:  None  FINDINGS: Normal bowel gas pattern.  No bowel dilatation or bowel wall thickening.  Bones appear demineralized with degenerative disc disease changes lumbar spine.  No urinary tract calcification.  IMPRESSION: Normal bowel gas pattern.   Electronically Signed   By: Lavonia Dana M.D.   On: 05/25/2015 08:43   Ct Head Wo Contrast   (if New Onset Seizure And/or Head Trauma)  05/25/2015   CLINICAL DATA:  Acute onset of seizure-like activity. Patient postictal and foaming at the  mouth. Initial encounter.  EXAM: CT HEAD WITHOUT CONTRAST  TECHNIQUE: Contiguous axial images were obtained from the base of the skull through the vertex without intravenous contrast.  COMPARISON:  None.  FINDINGS: There is no evidence of acute infarction, mass lesion, or intra- or extra-axial hemorrhage on CT.  Mild cerebellar atrophy is noted. Scattered periventricular and subcortical white matter change likely reflects small vessel ischemic microangiopathy. Small chronic lacunar infarcts are seen at the basal ganglia bilaterally.  The brainstem and fourth ventricle are within normal limits. The cerebral hemispheres demonstrate grossly normal gray-white differentiation. No mass effect or midline shift is seen.  There is no evidence of fracture; visualized osseous structures are unremarkable in appearance. The orbits are within normal limits. The paranasal sinuses and mastoid air cells are well-aerated. No significant soft tissue abnormalities are seen.  IMPRESSION: 1. No acute intracranial pathology seen on CT. 2. Scattered small vessel ischemic microangiopathy. 3. Small  chronic lacunar infarcts at the basal ganglia bilaterally.   Electronically Signed   By: Garald Balding M.D.   On: 05/25/2015 03:19   Mr Jeri Cos DV Contrast  05/25/2015   CLINICAL DATA:  65 year old diabetic hypertensive male with witnessed seizure-like activity while in jail. Subsequent encounter.  EXAM: MRI HEAD WITHOUT AND WITH CONTRAST  TECHNIQUE: Multiplanar, multiecho pulse sequences of the brain and surrounding structures were obtained without and with intravenous contrast.  CONTRAST:  26m MULTIHANCE GADOBENATE DIMEGLUMINE 529 MG/ML IV SOLN  COMPARISON:  05/25/2015 CT.  No comparison MR.  FINDINGS: Exam is motion degraded.  No acute infarct.  Remote small bilateral centrum semiovale, corona radiata, thalamic and basal ganglia infarcts. Remote right cerebellar infarct.  Prominent small vessel disease type changes.  No intracranial hemorrhage.  No evidence of mesial temporal sclerosis.  Mild global atrophy without hydrocephalus.  No MR evidence of herpes encephalitis.  No intracranial mass or abnormal enhancement.  5 x 4 x 10 mm nonspecific right parotid lesion.  Cervical medullary junction, pituitary region, pineal region and orbital structures unremarkable.  IMPRESSION: Exam is motion degraded.  No acute infarct.  Remote small bilateral centrum semiovale, corona radiata, thalamic and basal ganglia infarcts. Remote right cerebellar infarct.  Prominent small vessel disease type changes.  No intracranial hemorrhage.  No evidence of mesial temporal sclerosis.  Mild global atrophy without hydrocephalus.  No intracranial mass or abnormal enhancement.  5 x 4 x 10 mm nonspecific right parotid lesion.   Electronically Signed   By: SGenia DelM.D.   On: 05/25/2015 10:28        Scheduled Meds: . amLODipine  10 mg Oral Daily  . antiseptic oral rinse  7 mL Mouth Rinse BID  . heparin  5,000 Units Subcutaneous 3 times per day  . hydrochlorothiazide  25 mg Oral Daily  . insulin aspart  0-9 Units  Subcutaneous TID WC  . metoprolol tartrate  25 mg Oral BID   Continuous Infusions:    Active Problems:   Seizure   Acute encephalopathy   Delirium    Time spent: 45 minutes.    HVernell Leep MD, FACP, FHM. [facility] Pager [phone] If 7PM-7AM, please contact night-coverage [email or web address] Password [email or web address]6/22/2016, 3:36 PM    LOS: 1 day

## 2015-05-27 ENCOUNTER — Other Ambulatory Visit: Payer: Self-pay

## 2015-05-27 DIAGNOSIS — F1910 Other psychoactive substance abuse, uncomplicated: Secondary | ICD-10-CM

## 2015-05-27 DIAGNOSIS — R45851 Suicidal ideations: Secondary | ICD-10-CM

## 2015-05-27 DIAGNOSIS — F329 Major depressive disorder, single episode, unspecified: Secondary | ICD-10-CM

## 2015-05-27 DIAGNOSIS — F4323 Adjustment disorder with mixed anxiety and depressed mood: Secondary | ICD-10-CM | POA: Clinically undetermined

## 2015-05-27 LAB — GLUCOSE, CAPILLARY
GLUCOSE-CAPILLARY: 103 mg/dL — AB (ref 65–99)
GLUCOSE-CAPILLARY: 112 mg/dL — AB (ref 65–99)
GLUCOSE-CAPILLARY: 113 mg/dL — AB (ref 65–99)

## 2015-05-27 LAB — BASIC METABOLIC PANEL
ANION GAP: 8 (ref 5–15)
BUN: 17 mg/dL (ref 6–20)
CALCIUM: 9.4 mg/dL (ref 8.9–10.3)
CHLORIDE: 101 mmol/L (ref 101–111)
CO2: 27 mmol/L (ref 22–32)
CREATININE: 0.9 mg/dL (ref 0.61–1.24)
GFR calc non Af Amer: >60 mL/min (ref >60)
Glucose, Bld: 149 mg/dL — ABNORMAL HIGH (ref 65–99)
Potassium: 3.5 mmol/L (ref 3.5–5.1)
SODIUM: 136 mmol/L (ref 135–145)

## 2015-05-27 LAB — HIV ANTIBODY (ROUTINE TESTING W REFLEX): HIV Screen 4th Generation wRfx: NONREACTIVE

## 2015-05-27 LAB — RPR: RPR: NONREACTIVE

## 2015-05-27 MED ORDER — VITAMIN B-12 1000 MCG PO TABS: 1000.0000 ug | ORAL_TABLET | Freq: Every day | ORAL | Status: DC

## 2015-05-27 MED ORDER — AMLODIPINE BESYLATE 10 MG PO TABS: 10.0000 mg | ORAL_TABLET | Freq: Every day | ORAL | Status: DC

## 2015-05-27 MED ORDER — HYDROCHLOROTHIAZIDE 25 MG PO TABS: 25.0000 mg | ORAL_TABLET | Freq: Every day | ORAL | Status: DC

## 2015-05-27 MED ORDER — METOPROLOL TARTRATE 25 MG PO TABS: 25.0000 mg | ORAL_TABLET | Freq: Two times a day (BID) | ORAL | Status: DC

## 2015-05-27 NOTE — Discharge Summary (Signed)
Physician Discharge Summary  Philip Robinson BMS:[id number] DOB: 12/09/1950 DOA: 05/25/2015  PCP: No primary care provider on file.  Admit date: 05/25/2015 Discharge date: 05/27/2015  Time spent: Greater than 30 minutes  Recommendations for Outpatient Follow-up:  1. M.D. at [facility] in 3 days with repeat labs (BMP). Adjust antihypertensive medications as needed. 2. PCP upon discharge from jail (patient unable to provide name but states that he see somebody in [facility]) 3. Recommend outpatient follow-up and evaluation of right parotid lesion seen on MRI brain, as deemed necessary. This was explained to patient and he verbalized understanding.  Discharge Diagnoses:  Principal Problem:   Adjustment disorder with mixed anxiety and depressed mood Active Problems:   Seizure   Acute encephalopathy   Delirium   Essential hypertension, malignant   Hypokalemia   Discharge Condition: Improved & Stable  Diet recommendation: Heart healthy diet.  Filed Weights   05/25/15 1035  Weight: 93.5 kg (206 lb 2.1 oz)    History of present illness:  64 y.o. male with h/o HTN on no medications (since being incarcerated 05/08/15) presented after being found seizing by another inmate. Guards found him in a post ictal state and foaming at the mouth. He has been in jail since 05/08/15 for no showing his court date. CT and MRI Brain: no acute findings. EEG normal. Prolonged post ictal state. Neurology consulted.  Hospital Course:   Present on Admission:  Seizure: - Neurology consulted. - CT and MRI brain: No acute findings. EEG: No seizures - No further seizures since hospitalization. Patient denies family history or personal prior history of seizures - As per neurology, no AEDs indicated at this time - Patient counseled extensively regarding no driving, operating heavy machinery, being at heights or in standing water- until cleared by outpatient physician. He verbalized understanding - Post  ictal confusion has significantly improved/resolved. Baseline mental status not known. - UDS negative - Neurology has seen today and indicates no further neurological intervention. - Of note, patient has expressed to some staff that he is acting like he cannot remember things so that he does not have to go back to jail  Acute encephalopathy - Likely secondary to postictal state. Improved but may have some confusion or forgetfulness. Baseline mental status not known. - As per police personnel regarding patient in the hospital, we are not supposed to call his family for security concerns.  - TSH normal, UDS negative, B12: 192, ESR 9 - ? Related to uncontrolled hypertension-felt less likely. - Seems to have resolved. - Patient probably not cooperating fully for adequate mental status evaluation. - Low B12. We'll start B12 supplements.  Uncontrolled HTN, malignant - Patient presented with BP 225/154.  - Patient states that he has not been getting any medications since incarceration on 05/08/15. He states that he was on several medications but cannot recollect any names or the pharmacy that he filled these prescriptions form. - As stated above, police personnel advise against calling patient's spouse (separated) regarding patient hospitalization or details. - Started on amlodipine 10 MG daily. Added metoprolol 25 MG twice a day and HCTZ 25 MG daily. Monitor and may have to make adjustments - Blood pressure control is much improved. Further follow-up as outpatient.  Hypokalemia - Replaced  ?Diabetes Patient denies history of diabetes. A1c 5.9   Right Parotid lesion - Seen on MRI brain. Outpatient follow-up as deemed necessary  Old strokes - Seen on CT and MRI brain  B 12 deficiency - Started B12 supplements.  Recommend repeating B12 levels in 1-2 months.  Psychiatry evaluation - Patient's guarding police officer stated this morning that patient had told him that he would rather die than  return to jail. Patient denied suicidal or homicidal ideations. Psychiatry was consulted. Discussed with psychiatry. No suicidal ideations or intent. Psychiatry has cleared patient for discharge.   Consultants:  Neurology  Psychiatry  Procedures:  EEG 05/25/2015: IMPRESSION: Normal electroencephalogram, awake, drowsy and with activation procedures. There are no focal lateralizing or epileptiform features.    Discharge Exam:  Complaints:  Patient seen this morning. Stated that he was feeling much better. As per nursing report, patient apparently has not been upfront during mental status exams and that seems intentional. Patient's guarding police officer informed her that patient had told him that he would rather die than return to jail. Patient however denied suicidal or homicidal ideations. He denied delusions or hallucinations.  Filed Vitals:   05/27/15 0900 05/27/15 1000 05/27/15 1200 05/27/15 1600  BP: 135/74 149/84    Pulse:      Temp:   99 F (37.2 C) 98.6 F (37 C)  TempSrc:   Oral Oral  Resp: 18 19    Height:      Weight:      SpO2:      Pulse: 58/m. Oxygen saturation 99%.   General exam: Moderately built and nourished pleasant male lying comfortably supine in bed. Respiratory system: Clear. No increased work of breathing. Cardiovascular system: S1 & S2 heard, RRR. No JVD, murmurs, gallops, clicks or pedal edema. Telemetry: Sinus rhythm. Some sinus bradycardia in the 50s at night-most likely when asleep and asymptomatic. Gastrointestinal system: Abdomen is nondistended, soft and nontender. Normal bowel sounds heard. Central nervous system: Alert and oriented3 . No focal neurological deficits. Extremities: Symmetric 5 x 5 power.Bilateral legs shackled to the bed..  Discharge Instructions      Discharge Instructions    Activity as tolerated - No restrictions    Complete by:  As directed      Call MD for:    Complete by:  As directed   Seizure like activity.      Diet - low sodium heart healthy    Complete by:  As directed      Discharge instructions    Complete by:  As directed   Patient has been repeatedly counseled that he should not drive, not operate heavy machinery, avoid heights & standing water, until cleared by outpatient physician. He verbalizes understanding.            Medication List    TAKE these medications        amLODipine 10 MG tablet  Commonly known as:  NORVASC  Take 1 tablet (10 mg total) by mouth daily.     hydrochlorothiazide 25 MG tablet  Commonly known as:  HYDRODIURIL  Take 1 tablet (25 mg total) by mouth daily.     metoprolol tartrate 25 MG tablet  Commonly known as:  LOPRESSOR  Take 1 tablet (25 mg total) by mouth 2 (two) times daily.       Follow-up Information    Follow up with M[facility]D. at [facility]. Schedule an appointment as soon as possible for a visit in 3 days.   Why:  To be seen with repeat labs (BMP). Adjust BP meds as needed.       The results of significant diagnostics from this hospitalization (including imaging, microbiology, ancillary and laboratory) are listed below for reference.    Significant Diagnostic  Studies: Dg Chest 1 View  05/25/2015   CLINICAL DATA:  Seizure this morning.  Pre MRI clearance.  Cough.  EXAM: CHEST  1 VIEW  COMPARISON:  10/19/2010  FINDINGS: The cardiomediastinal silhouette is unremarkable.  There is no evidence of focal airspace disease, pulmonary edema, suspicious pulmonary nodule/mass, pleural effusion, or pneumothorax. No acute bony abnormalities are identified.  No pacemaker or metallic foreign bodies are identified.  IMPRESSION: No active disease.  No pacemaker or unexpected metallic foreign bodies identified.   Electronically Signed   By: Margarette Canada M.D.   On: 05/25/2015 08:43   Dg Abd 1 View  05/25/2015   CLINICAL DATA:  Seizure, nausea, vomiting, coughing, history hypertension, diabetes mellitus  EXAM: ABDOMEN - 1 VIEW  COMPARISON:  None  FINDINGS: Normal bowel gas  pattern.  No bowel dilatation or bowel wall thickening.  Bones appear demineralized with degenerative disc disease changes lumbar spine.  No urinary tract calcification.  IMPRESSION: Normal bowel gas pattern.   Electronically Signed   By: Lavonia Dana M.D.   On: 05/25/2015 08:43   Ct Head Wo Contrast   (if New Onset Seizure And/or Head Trauma)  05/25/2015   CLINICAL DATA:  Acute onset of seizure-like activity. Patient postictal and foaming at the mouth. Initial encounter.  EXAM: CT HEAD WITHOUT CONTRAST  TECHNIQUE: Contiguous axial images were obtained from the base of the skull through the vertex without intravenous contrast.  COMPARISON:  None.  FINDINGS: There is no evidence of acute infarction, mass lesion, or intra- or extra-axial hemorrhage on CT.  Mild cerebellar atrophy is noted. Scattered periventricular and subcortical white matter change likely reflects small vessel ischemic microangiopathy. Small chronic lacunar infarcts are seen at the basal ganglia bilaterally.  The brainstem and fourth ventricle are within normal limits. The cerebral hemispheres demonstrate grossly normal gray-white differentiation. No mass effect or midline shift is seen.  There is no evidence of fracture; visualized osseous structures are unremarkable in appearance. The orbits are within normal limits. The paranasal sinuses and mastoid air cells are well-aerated. No significant soft tissue abnormalities are seen.  IMPRESSION: 1. No acute intracranial pathology seen on CT. 2. Scattered small vessel ischemic microangiopathy. 3. Small chronic lacunar infarcts at the basal ganglia bilaterally.   Electronically Signed   By: Garald Balding M.D.   On: 05/25/2015 03:19   Mr Jeri Cos QB Contrast  05/25/2015   CLINICAL DATA:  64 year old diabetic hypertensive male with witnessed seizure-like activity while in jail. Subsequent encounter.  EXAM: MRI HEAD WITHOUT AND WITH CONTRAST  TECHNIQUE: Multiplanar, multiecho pulse sequences of the  brain and surrounding structures were obtained without and with intravenous contrast.  CONTRAST:  40m MULTIHANCE GADOBENATE DIMEGLUMINE 529 MG/ML IV SOLN  COMPARISON:  05/25/2015 CT.  No comparison MR.  FINDINGS: Exam is motion degraded.  No acute infarct.  Remote small bilateral centrum semiovale, corona radiata, thalamic and basal ganglia infarcts. Remote right cerebellar infarct.  Prominent small vessel disease type changes.  No intracranial hemorrhage.  No evidence of mesial temporal sclerosis.  Mild global atrophy without hydrocephalus.  No MR evidence of herpes encephalitis.  No intracranial mass or abnormal enhancement.  5 x 4 x 10 mm nonspecific right parotid lesion.  Cervical medullary junction, pituitary region, pineal region and orbital structures unremarkable.  IMPRESSION: Exam is motion degraded.  No acute infarct.  Remote small bilateral centrum semiovale, corona radiata, thalamic and basal ganglia infarcts. Remote right cerebellar infarct.  Prominent small vessel disease type changes.  No intracranial hemorrhage.  No evidence of mesial temporal sclerosis.  Mild global atrophy without hydrocephalus.  No intracranial mass or abnormal enhancement.  5 x 4 x 10 mm nonspecific right parotid lesion.   Electronically Signed   By: Genia Del M.D.   On: 05/25/2015 10:28    Microbiology: Recent Results (from the past 240 hour(s))  MRSA PCR Screening     Status: None   Collection Time: 05/25/15 10:52 AM  Result Value Ref Range Status   MRSA by PCR NEGATIVE NEGATIVE Final    Comment:        The GeneXpert MRSA Assay (FDA approved for NASAL specimens only), is one component of a comprehensive MRSA colonization surveillance program. It is not intended to diagnose MRSA infection nor to guide or monitor treatment for MRSA infections.      Labs: Basic Metabolic Panel:  Recent Labs Lab 05/25/15 0248 05/25/15 1450 05/26/15 0332 05/27/15 0337  NA 135  --  139 136  K 4.3  --  3.4* 3.5   CL 101  --  105 101  CO2 26  --  27 27  GLUCOSE 177*  --  113* 149*  BUN 16  --  14 17  CREATININE 0.95  --  0.89 0.90  CALCIUM 9.0  --  9.2 9.4  MG  --  1.8  --   --   PHOS  --  2.8  --   --    Liver Function Tests:  Recent Labs Lab 05/25/15 0248  AST 16  ALT 11*  ALKPHOS 65  BILITOT 0.6  PROT 7.8  ALBUMIN 4.1   No results for input(s): LIPASE, AMYLASE in the last 168 hours. No results for input(s): AMMONIA in the last 168 hours. CBC:  Recent Labs Lab 05/25/15 0248 05/26/15 0332  WBC 4.2 6.8  HGB 14.1 13.9  HCT 41.5 41.2  MCV 95.0 95.6  PLT 190 204   Cardiac Enzymes: No results for input(s): CKTOTAL, CKMB, CKMBINDEX, TROPONINI in the last 168 hours. BNP: BNP (last 3 results) No results for input(s): BNP in the last 8760 hours.  ProBNP (last 3 results) No results for input(s): PROBNP in the last 8760 hours.  CBG:  Recent Labs Lab 05/26/15 1604 05/26/15 2203 05/27/15 0751 05/27/15 1201 05/27/15 1618  GLUCAP 98 134* 103* 112* 113*     Additional labs:  B12: 192  ESR: 9  TSH: 1.449  HbA1C: 5.9  RPR: Nonreactive  HIV antibody screen: Nonreactive  UDS: Negative  EKG: QTC 509 on admission. Repeated today and it is 422 ms.  Signed:  Vernell Leep, MD, FACP, FHM. [facility] Pager [phone]  If 7PM-7AM, please contact night-coverage [email or web address] Password [facility] 05/27/2015, 4:47 PM

## 2015-05-27 NOTE — Progress Notes (Signed)
While rounding with attending physician, [facility] made aware that patient has stated "I would rather die, than return back to jail". Patient states that he did not mean anything by it and does not have any thoughts of self harm or harm to others. Physician consulted with psych who will assess patient.

## 2015-05-27 NOTE — Progress Notes (Signed)
Philip Robinson to be D/C'd Home per MD order.  Discussed prescriptions and follow up appointments with the patient. Prescriptions given to patient, medication list explained in detail. Pt verbalized understanding.    Medication List    TAKE these medications        amLODipine 10 MG tablet  Commonly known as:  NORVASC  Take 1 tablet (10 mg total) by mouth daily.     hydrochlorothiazide 25 MG tablet  Commonly known as:  HYDRODIURIL  Take 1 tablet (25 mg total) by mouth daily.     metoprolol tartrate 25 MG tablet  Commonly known as:  LOPRESSOR  Take 1 tablet (25 mg total) by mouth 2 (two) times daily.        Filed Vitals:   05/27/15 1700  BP: 141/102  Pulse:   Temp:   Resp:     Skin clean, dry and intact without evidence of skin break down, no evidence of skin tears noted. IV catheter discontinued intact. Site without signs and symptoms of complications. Dressing and pressure applied. Pt denies pain at this time. No complaints noted.  An After Visit Summary was printed and given to the patient. Patient escorted via WC, and D/C home via private auto.  Philip Robinson 05/27/2015 5:39 PM
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
## 2015-05-27 NOTE — Progress Notes (Signed)
Subjective: No further seizures noted.  Patient has expressed to some staff that he is acting like he can not remember things so that he does not have to go back to jail.  Today reported that he would rather die than return.    Objective: Current vital signs: BP 149/84 mmHg  Pulse 58  Temp(Src) 98.7 F (37.1 C) (Oral)  Resp 19  Ht _0  (1.803 m)  Wt 93.5 kg (206 lb 2.1 oz)  BMI 28.76 kg/m2  SpO2 99% Vital signs in last 24 hours: Temp:  [98.3 F (36.8 C)-98.8 F (37.1 C)] 98.7 F (37.1 C) (06/23 0800) Pulse Rate:  [57-99] 58 (06/22 1800) Resp:  [11-25] 19 (06/23 1000) BP: (106-187)/(53-138) 149/84 mmHg (06/23 1000) SpO2:  [98 %-100 %] 99 % (06/23 0400)  Intake/Output from previous day: 06/22 0701 - 06/23 0700 In: 940 [P.O.:940] Out: 3325 [Urine:3325] Intake/Output this shift:   Nutritional status: Diet heart healthy/carb modified Room service appropriate?: Yes; Fluid consistency:: Thin  Neurologic Exam: Mental Status: Alert and awake. Follows commands.  Speech fluent. Cranial Nerves: II: Discs flat bilaterally; Visual fields grossly normal, pupils equal, round, reactive to light and accommodation III,IV, VI: ptosis not present, extra-ocular motions intact bilaterally V,VII: smile symmetric, facial light touch sensation normal bilaterally VIII: hearing normal bilaterally IX,X: gag reflex present XI: bilateral shoulder shrug XII: midline tongue extension Motor: Right :Upper extremity 5/5Left: Upper extremity 5/5 Lower extremity 5/5Lower extremity 5/5 Tone and bulk:normal tone throughout; no atrophy noted Sensory: Pinprick and light touch intact throughout, bilaterally Deep Tendon Reflexes: 2+ and symmetric throughout   Lab Results: Basic Metabolic Panel:  Recent Labs Lab 05/25/15 0248 05/25/15 1450 05/26/15 0332 05/27/15 0337  NA 135  --  139  136  K 4.3  --  3.4* 3.5  CL 101  --  105 101  CO2 26  --  27 27  GLUCOSE 177*  --  113* 149*  BUN 16  --  14 17  CREATININE 0.95  --  0.89 0.90  CALCIUM 9.0  --  9.2 9.4  MG  --  1.8  --   --   PHOS  --  2.8  --   --     Liver Function Tests:  Recent Labs Lab 05/25/15 0248  AST 16  ALT 11*  ALKPHOS 65  BILITOT 0.6  PROT 7.8  ALBUMIN 4.1   No results for input(s): LIPASE, AMYLASE in the last 168 hours. No results for input(s): AMMONIA in the last 168 hours.  CBC:  Recent Labs Lab 05/25/15 0248 05/26/15 0332  WBC 4.2 6.8  HGB 14.1 13.9  HCT 41.5 41.2  MCV 95.0 95.6  PLT 190 204    Cardiac Enzymes: No results for input(s): CKTOTAL, CKMB, CKMBINDEX, TROPONINI in the last 168 hours.  Lipid Panel: No results for input(s): CHOL, TRIG, HDL, CHOLHDL, VLDL, LDLCALC in the last 168 hours.  CBG:  Recent Labs Lab 05/26/15 0811 05/26/15 1207 05/26/15 1604 05/26/15 2203 05/27/15 0751  GLUCAP 117* 129* 98 134* 103*    Microbiology: Results for orders placed or performed during the hospital encounter of 05/25/15  MRSA PCR Screening     Status: None   Collection Time: 05/25/15 10:52 AM  Result Value Ref Range Status   MRSA by PCR NEGATIVE NEGATIVE Final    Comment:        The GeneXpert MRSA Assay (FDA approved for NASAL specimens only), is one component of a comprehensive MRSA colonization surveillance program. It  is not intended to diagnose MRSA infection nor to guide or monitor treatment for MRSA infections.     Coagulation Studies: No results for input(s): LABPROT, INR in the last 72 hours.  Imaging: No results found.  Medications:  I have reviewed the patient's current medications. Scheduled: . amLODipine  10 mg Oral Daily  . antiseptic oral rinse  7 mL Mouth Rinse BID  . heparin  5,000 Units Subcutaneous 3 times per day  . hydrochlorothiazide  25 mg Oral Daily  . insulin aspart  0-9 Units Subcutaneous TID WC  . metoprolol tartrate  25 mg  Oral BID    Assessment/Plan: No further seizures noted.  MRI and EEG unremarkable.  BP controlled.  TSH, ESR, B12 and RPR are normal.    Recommendations: 1.  No further neurologic intervention is recommended at this time.  If further questions arise, please call or page at that time.  Thank you for allowing neurology to participate in the care of this patient.  Patient may follow up with PCP after release from custody.    Alexis Goodell, MD [facility] [phone] 05/27/2015  11:15 AM    LOS: 2 days   Alexis Goodell, MD [facility] [phone] 05/27/2015  11:11 AM

## 2015-05-27 NOTE — Clinical Social Work Psych Assess (Signed)
Clinical Social Work Librarian, academic  Clinical Social Worker:  [place], [place] Date/Time:  05/27/2015, 3:04 PM Referred By:  Physician Date Referred:  05/27/15 Reason for Referral:  Behavioral Health Issues   Presenting Symptoms/Problems  Presenting Symptoms/Problems(in person's/family's own words):  Psych consulted due to patient making statements about wanting to die rather than return to jail.   Abuse/Neglect/Trauma History  Abuse/Neglect/Trauma History:  Denies History Abuse/Neglect/Trauma History Comments (indicate dates):  N/A   Psychiatric History  Psychiatric History:  Denies History Psychiatric Medication:   N/A   Current Mental Health Hospitalizations/Previous Mental Health History:  Patient denies any previous MH concerns.   Current Provider:  None Place and Date:   N/A  Current Medications:   Scheduled Meds: . amLODipine  10 mg Oral Daily  . antiseptic oral rinse  7 mL Mouth Rinse BID  . heparin  5,000 Units Subcutaneous 3 times per day  . hydrochlorothiazide  25 mg Oral Daily  . insulin aspart  0-9 Units Subcutaneous TID WC  . metoprolol tartrate  25 mg Oral BID   Continuous Infusions:  PRN Meds:.acetaminophen **OR** acetaminophen, albuterol, guaiFENesin-dextromethorphan, hydrALAZINE, LORazepam, ondansetron **OR** ondansetron (ZOFRAN) IV     Previous Inpatient Admission/Date/Reason:  None reported   Emotional Health/Current Symptoms  Suicide/Self Harm: None Reported Suicide Attempt in Past (date/description):  Patient reports he has made comments in the past about wanting to die when he was upset but would never harm himself because of his 7 children. Patient denies any previous suicide attempts.  Other Harmful Behavior (ex. homicidal ideation) (describe):  None reported   Psychotic/Dissociative Symptoms  Psychotic/Dissociative Symptoms: None Reported Other Psychotic/Dissociative Symptoms:  N/A   Attention/Behavioral  Symptoms  Attention/Behavioral Symptoms: Inattentive Other Attention/Behavioral Symptoms:  Patient has to be redirected throughout   Cognitive Impairment  Cognitive Impairment:  Within Normal Limits Other Cognitive Impairment:  N/A   Mood and Adjustment  Mood and Adjustment:  Guarded   Stress, Anxiety, Trauma, Any Recent Loss/Stressor  Stress, Anxiety, Trauma, Any Recent Loss/Stressor: None Reported Anxiety (frequency):  N/A  Phobia (specify):  N/A  Compulsive Behavior (specify):  N/A  Obsessive Behavior (specify):  N/A  Other Stress, Anxiety, Trauma, Any Recent Loss/Stressor:  N/A   Substance Abuse/Use  Substance Abuse/Use: None SBIRT Completed (please refer for detailed history): No Self-reported Substance Use (last use and frequency):  Patient denies any alcohol or drug use.  Urinary Drug Screen Completed: Yes Alcohol Level:  N/A   Environment/Housing/Living Arrangement  Environmental/Housing/Living Arrangement:  (From [place]) Who is in the Home:  N/A  Emergency Contact:  None reported   Financial  Financial: Medicare   Patient's Strengths and Goals  Patient's Strengths and Goals (patient's own words):  Patient reports good relationship with children.   Clinical Social Worker's Interpretive Summary  Clinical Social Workers Interpretive Summary:    CSW and psych MD rounded together to evaluate patient. Patient laying in bed with officers in room. Patient was in jail and had a seizure so was brought to the hospital. Patient denies any previous seizures but does not remember specific details of seizure prior to hospitalization. Patient lives alone in [place] but has 7 children ranging from ages 53-8 years old. Patient reports he had a parole violation but does not remember why he was on parole.  Patient denies and previous MH or SA history. Patient denies any depression. Psych MD questioned patient about making statements about no longer wanting to  live. Patient reports that he sometimes  makes statements that he does not mean. Patient contracts for safety and denies any SI or HI. Patient reports that he would never harm himself because he wants to live to be with his children.  Psych MD and CSW spoke with officer in hallway who reports patient was not on suicide precautions at jail but is unaware of any of patient's history.  Patient is not a danger to himself and denies any SI or HI.  CSW is signing off but available if further needs arise.   Disposition  Disposition: Psych Clinical Social Worker [address]    [place], [place] [phone]

## 2016-03-27 DIAGNOSIS — Z8673 Personal history of transient ischemic attack (TIA), and cerebral infarction without residual deficits: Secondary | ICD-10-CM | POA: Insufficient documentation

## 2016-03-27 DIAGNOSIS — E785 Hyperlipidemia, unspecified: Secondary | ICD-10-CM | POA: Insufficient documentation

## 2016-03-27 DIAGNOSIS — Z720 Tobacco use: Secondary | ICD-10-CM | POA: Insufficient documentation

## 2016-03-27 DIAGNOSIS — F419 Anxiety disorder, unspecified: Secondary | ICD-10-CM | POA: Insufficient documentation

## 2017-12-07 DIAGNOSIS — I7103 Dissection of thoracoabdominal aorta: Secondary | ICD-10-CM | POA: Insufficient documentation

## 2018-02-12 DIAGNOSIS — R350 Frequency of micturition: Secondary | ICD-10-CM | POA: Insufficient documentation

## 2018-02-12 DIAGNOSIS — N3941 Urge incontinence: Secondary | ICD-10-CM | POA: Insufficient documentation

## 2019-03-07 ENCOUNTER — Other Ambulatory Visit: Payer: Self-pay

## 2019-03-07 ENCOUNTER — Inpatient Hospital Stay (HOSPITAL_COMMUNITY)
Admission: EM | Admit: 2019-03-07 | Discharge: 2019-03-09 | DRG: 070 | Disposition: A | Discharge status: Home / Self Care | Payer: Medicare Other | Attending: Internal Medicine | Admitting: Internal Medicine

## 2019-03-07 ENCOUNTER — Emergency Department (HOSPITAL_COMMUNITY): Payer: Medicare Other

## 2019-03-07 DIAGNOSIS — I10 Essential (primary) hypertension: Secondary | ICD-10-CM | POA: Diagnosis present

## 2019-03-07 DIAGNOSIS — F0150 Vascular dementia without behavioral disturbance: Secondary | ICD-10-CM | POA: Diagnosis present

## 2019-03-07 DIAGNOSIS — Z7982 Long term (current) use of aspirin: Secondary | ICD-10-CM

## 2019-03-07 DIAGNOSIS — J181 Lobar pneumonia, unspecified organism: Secondary | ICD-10-CM

## 2019-03-07 DIAGNOSIS — Z720 Tobacco use: Secondary | ICD-10-CM

## 2019-03-07 DIAGNOSIS — G9341 Metabolic encephalopathy: Secondary | ICD-10-CM | POA: Diagnosis present

## 2019-03-07 DIAGNOSIS — G9340 Encephalopathy, unspecified: Secondary | ICD-10-CM | POA: Diagnosis present

## 2019-03-07 DIAGNOSIS — N179 Acute kidney failure, unspecified: Secondary | ICD-10-CM | POA: Diagnosis present

## 2019-03-07 DIAGNOSIS — I712 Thoracic aortic aneurysm, without rupture: Secondary | ICD-10-CM | POA: Diagnosis present

## 2019-03-07 DIAGNOSIS — I160 Hypertensive urgency: Secondary | ICD-10-CM

## 2019-03-07 DIAGNOSIS — R32 Unspecified urinary incontinence: Secondary | ICD-10-CM | POA: Diagnosis present

## 2019-03-07 DIAGNOSIS — I7121 Aneurysm of the ascending aorta, without rupture: Secondary | ICD-10-CM

## 2019-03-07 DIAGNOSIS — Z79899 Other long term (current) drug therapy: Secondary | ICD-10-CM

## 2019-03-07 DIAGNOSIS — J9811 Atelectasis: Secondary | ICD-10-CM | POA: Diagnosis present

## 2019-03-07 DIAGNOSIS — R9401 Abnormal electroencephalogram [EEG]: Secondary | ICD-10-CM | POA: Diagnosis present

## 2019-03-07 DIAGNOSIS — Z8673 Personal history of transient ischemic attack (TIA), and cerebral infarction without residual deficits: Secondary | ICD-10-CM

## 2019-03-07 DIAGNOSIS — R4781 Slurred speech: Secondary | ICD-10-CM | POA: Diagnosis not present

## 2019-03-07 DIAGNOSIS — F17210 Nicotine dependence, cigarettes, uncomplicated: Secondary | ICD-10-CM | POA: Diagnosis not present

## 2019-03-07 DIAGNOSIS — J189 Pneumonia, unspecified organism: Secondary | ICD-10-CM

## 2019-03-07 DIAGNOSIS — I7100 Dissection of unspecified site of aorta: Secondary | ICD-10-CM

## 2019-03-07 DIAGNOSIS — E119 Type 2 diabetes mellitus without complications: Secondary | ICD-10-CM | POA: Diagnosis present

## 2019-03-07 DIAGNOSIS — I723 Aneurysm of iliac artery: Secondary | ICD-10-CM | POA: Diagnosis present

## 2019-03-07 DIAGNOSIS — R569 Unspecified convulsions: Secondary | ICD-10-CM

## 2019-03-07 DIAGNOSIS — I639 Cerebral infarction, unspecified: Secondary | ICD-10-CM

## 2019-03-07 DIAGNOSIS — R262 Difficulty in walking, not elsewhere classified: Secondary | ICD-10-CM

## 2019-03-07 DIAGNOSIS — G40909 Epilepsy, unspecified, not intractable, without status epilepticus: Secondary | ICD-10-CM | POA: Diagnosis present

## 2019-03-07 LAB — URINALYSIS, COMPLETE (UACMP) WITH MICROSCOPIC
Bacteria, UA: NONE SEEN
Bilirubin Urine: NEGATIVE
Glucose, UA: NEGATIVE mg/dL
Ketones, ur: 5 mg/dL — AB
Leukocytes,Ua: NEGATIVE
Nitrite: NEGATIVE
Protein, ur: 100 mg/dL — AB
Specific Gravity, Urine: 1.021 (ref 1.005–1.030)
pH: 5 (ref 5.0–8.0)

## 2019-03-07 LAB — CBC WITH DIFFERENTIAL/PLATELET
Abs Immature Granulocytes: 0.04 10*3/uL (ref 0.00–0.07)
Basophils Absolute: 0 10*3/uL (ref 0.0–0.1)
Basophils Relative: 0 %
Eosinophils Absolute: 0 10*3/uL (ref 0.0–0.5)
Eosinophils Relative: 0 %
HCT: 50.3 % (ref 39.0–52.0)
Hemoglobin: 16.4 g/dL (ref 13.0–17.0)
Immature Granulocytes: 0 %
Lymphocytes Relative: 13 %
Lymphs Abs: 1.3 10*3/uL (ref 0.7–4.0)
MCH: 31.5 pg (ref 26.0–34.0)
MCHC: 32.6 g/dL (ref 30.0–36.0)
MCV: 96.5 fL (ref 80.0–100.0)
Monocytes Absolute: 1.1 10*3/uL — ABNORMAL HIGH (ref 0.1–1.0)
Monocytes Relative: 11 %
Neutro Abs: 7.5 10*3/uL (ref 1.7–7.7)
Neutrophils Relative %: 76 %
Platelets: 232 10*3/uL (ref 150–400)
RBC: 5.21 MIL/uL (ref 4.22–5.81)
RDW: 12.8 % (ref 11.5–15.5)
WBC: 9.9 10*3/uL (ref 4.0–10.5)
nRBC: 0 % (ref 0.0–0.2)

## 2019-03-07 LAB — COMPREHENSIVE METABOLIC PANEL
ALT: 21 U/L (ref 0–44)
AST: 50 U/L — ABNORMAL HIGH (ref 15–41)
Albumin: 4.1 g/dL (ref 3.5–5.0)
Alkaline Phosphatase: 81 U/L (ref 38–126)
Anion gap: 9 (ref 5–15)
BUN: 23 mg/dL (ref 8–23)
CO2: 25 mmol/L (ref 22–32)
Calcium: 9.2 mg/dL (ref 8.9–10.3)
Chloride: 102 mmol/L (ref 98–111)
Creatinine, Ser: 1.31 mg/dL — ABNORMAL HIGH (ref 0.61–1.24)
GFR calc Af Amer: >60 mL/min (ref >60)
GFR calc non Af Amer: 55 mL/min — ABNORMAL LOW (ref >60)
Glucose, Bld: 118 mg/dL — ABNORMAL HIGH (ref 70–99)
Potassium: 4.2 mmol/L (ref 3.5–5.1)
Sodium: 136 mmol/L (ref 135–145)
Total Bilirubin: 1.1 mg/dL (ref 0.3–1.2)
Total Protein: 8.3 g/dL — ABNORMAL HIGH (ref 6.5–8.1)

## 2019-03-07 LAB — BLOOD GAS, VENOUS
Acid-Base Excess: 0.8 mmol/L (ref 0.0–2.0)
Bicarbonate: 26.7 mmol/L (ref 20.0–28.0)
O2 Saturation: 40.7 %
Patient temperature: 98.6
pCO2, Ven: 48.3 mmHg (ref 44.0–60.0)
pH, Ven: 7.362 (ref 7.250–7.430)

## 2019-03-07 LAB — INFLUENZA PANEL BY PCR (TYPE A & B)
Influenza A By PCR: NEGATIVE
Influenza B By PCR: NEGATIVE

## 2019-03-07 LAB — AMMONIA: Ammonia: 13 umol/L (ref 9–35)

## 2019-03-07 LAB — ETHANOL: Alcohol, Ethyl (B): <10 mg/dL (ref <10)

## 2019-03-07 LAB — CBG MONITORING, ED: Glucose-Capillary: 117 mg/dL — ABNORMAL HIGH (ref 70–99)

## 2019-03-07 LAB — PHENYTOIN LEVEL, TOTAL
Phenytoin Lvl: <2.5 ug/mL — ABNORMAL LOW (ref 10.0–20.0)
Phenytoin Lvl: <2.5 ug/mL — ABNORMAL LOW (ref 10.0–20.0)

## 2019-03-07 MED ORDER — SODIUM CHLORIDE 0.9 % IV SOLN
INTRAVENOUS | Status: DC | PRN
  Administered 2019-03-07: 500 mL via INTRAVENOUS

## 2019-03-07 MED ORDER — SODIUM CHLORIDE 0.9 % IV BOLUS
1000.0000 mL | Freq: Once | INTRAVENOUS | Status: AC
  Administered 2019-03-07: 1000 mL via INTRAVENOUS

## 2019-03-07 MED ORDER — ASPIRIN 81 MG PO CHEW
81.0000 mg | CHEWABLE_TABLET | Freq: Every day | ORAL | Status: DC
  Administered 2019-03-08 – 2019-03-09 (×2): 81 mg via ORAL
  Filled 2019-03-07 (×2): qty 1

## 2019-03-07 MED ORDER — SODIUM CHLORIDE 0.9 % IV SOLN
500.0000 mg | Freq: Once | INTRAVENOUS | Status: AC
  Administered 2019-03-07: 500 mg via INTRAVENOUS
  Filled 2019-03-07: qty 500

## 2019-03-07 MED ORDER — HYDRALAZINE HCL 25 MG PO TABS
25.0000 mg | ORAL_TABLET | Freq: Three times a day (TID) | ORAL | Status: DC
  Administered 2019-03-08 (×2): 25 mg via ORAL
  Filled 2019-03-07 (×2): qty 1

## 2019-03-07 MED ORDER — AMLODIPINE BESYLATE 10 MG PO TABS
10.0000 mg | ORAL_TABLET | Freq: Every day | ORAL | Status: DC
  Administered 2019-03-08 – 2019-03-09 (×2): 10 mg via ORAL
  Filled 2019-03-07 (×2): qty 1

## 2019-03-07 MED ORDER — SODIUM CHLORIDE 0.9 % IV SOLN
10.0000 mg/kg | Freq: Once | INTRAVENOUS | Status: AC
  Administered 2019-03-07: 21:00:00 940 mg via INTRAVENOUS
  Filled 2019-03-07: qty 18.8

## 2019-03-07 MED ORDER — PHENYTOIN SODIUM EXTENDED 100 MG PO CAPS
300.0000 mg | ORAL_CAPSULE | Freq: Every day | ORAL | Status: DC
  Administered 2019-03-08: 300 mg via ORAL
  Filled 2019-03-07 (×2): qty 3

## 2019-03-07 MED ORDER — ENOXAPARIN SODIUM 40 MG/0.4ML ~~LOC~~ SOLN
40.0000 mg | SUBCUTANEOUS | Status: DC
  Administered 2019-03-08: 40 mg via SUBCUTANEOUS
  Filled 2019-03-07: qty 0.4

## 2019-03-07 MED ORDER — LABETALOL HCL 5 MG/ML IV SOLN
20.0000 mg | Freq: Once | INTRAVENOUS | Status: AC
  Administered 2019-03-07: 20 mg via INTRAVENOUS
  Filled 2019-03-07: qty 4

## 2019-03-07 MED ORDER — LABETALOL HCL 100 MG PO TABS
50.0000 mg | ORAL_TABLET | Freq: Two times a day (BID) | ORAL | Status: DC
  Administered 2019-03-08 – 2019-03-09 (×4): 50 mg via ORAL
  Filled 2019-03-07 (×5): qty 1

## 2019-03-07 MED ORDER — SODIUM CHLORIDE 0.9 % IV SOLN
1.0000 g | Freq: Once | INTRAVENOUS | Status: AC
  Administered 2019-03-07: 1 g via INTRAVENOUS
  Filled 2019-03-07: qty 10

## 2019-03-07 NOTE — ED Notes (Signed)
ED TO INPATIENT HANDOFF REPORT  ED Nurse Name and Phone #: [facility]   S Name/Age/Gender Philip Robinson 68 y.o. male Room/Bed: WA16/WA16  Code Status   Code Status: Prior  Home/SNF/Other Home Patient oriented to: self Is this baseline? No   Triage Complete: Triage complete  Chief Complaint AMS; possible UTI  Triage Note Pt BIBA from home c/o generalized weakness, family reports confusion.  Pt c/o polyuria.  Family denies recent travel, fever, cough.     EMS found him in floor- family reports he tried to get up from bed and slid to floor. Family denies LOC, denies he hit his head.   Family reports pt has dementia at baseline.     Allergies No Known Allergies  Level of Care/Admitting Diagnosis ED Disposition    ED Disposition Condition Comment   Admit  Hospital Area: [facility] [[id number]]  Level of Care: Telemetry Medical [104]  Diagnosis: Acute encephalopathy [161096]  Admitting Physician: Erenest Blank  Attending Physician: Kathrynn Running [[id number]]  Estimated length of stay: past midnight tomorrow  Certification:: I certify this patient will need inpatient services for at least 2 midnights  Bed request comments: covid r/o  low risk  PT Class (Do Not Modify): Inpatient [101]  PT Acc Code (Do Not Modify): Private [1]       B Medical/Surgery History Past Medical History:  Diagnosis Date  . Diabetes mellitus without complication   . Hypertension    No past surgical history on file.   A IV Location/Drains/Wounds Patient Lines/Drains/Airways Status   Active Line/Drains/Airways    Name:   Placement date:   Placement time:   Site:   Days:   Peripheral IV 03/07/19 Left Forearm   03/07/19    -    Forearm   less than 1          Intake/Output Last 24 hours  Intake/Output Summary (Last 24 hours) at 03/07/2019 2122 Last data filed at 03/07/2019 2122 Gross per 24 hour  Intake 1672.24 ml  Output 500 ml  Net 1172.24 ml     Labs/Imaging Results for orders placed or performed during the hospital encounter of 03/07/19 (from the past 48 hour(s))  CBG monitoring, ED     Status: Abnormal   Collection Time: 03/07/19  2:54 PM  Result Value Ref Range   Glucose-Capillary 117 (H) 70 - 99 mg/dL  Blood gas, venous     Status: None   Collection Time: 03/07/19  2:58 PM  Result Value Ref Range   pH, Ven 7.362 7.250 - 7.430   pCO2, Ven 48.3 44.0 - 60.0 mmHg   pO2, Ven BELOW REPORTABLE RANGE 32.0 - 45.0 mmHg    Comment: CRITICAL RESULT CALLED TO, READ BACK BY AND VERIFIED WITH: DR.GOLDSTON AT 1514 BY T.BURGESS,RRT,RCP ON 03/07/2019    Bicarbonate 26.7 20.0 - 28.0 mmol/L   Acid-Base Excess 0.8 0.0 - 2.0 mmol/L   O2 Saturation 40.7 %   Patient temperature 98.6    Collection site VEIN    Drawn by COLLECTED BY NURSE    Sample type VENOUS     Comment: Performed at [facility], [address], [place], [place] [address]  Ammonia     Status: None   Collection Time: 03/07/19  4:12 PM  Result Value Ref Range   Ammonia 13 9 - 35 umol/L    Comment: Performed at [facility], [address], [place], [place] [address]  Comprehensive  metabolic panel     Status: Abnormal   Collection Time: 03/07/19  4:12 PM  Result Value Ref Range   Sodium 136 135 - 145 mmol/L   Potassium 4.2 3.5 - 5.1 mmol/L   Chloride 102 98 - 111 mmol/L   CO2 25 22 - 32 mmol/L   Glucose, Bld 118 (H) 70 - 99 mg/dL   BUN 23 8 - 23 mg/dL   Creatinine, Ser 8.88 (H) 0.61 - 1.24 mg/dL   Calcium 9.2 8.9 - 91.6 mg/dL   Total Protein 8.3 (H) 6.5 - 8.1 g/dL   Albumin 4.1 3.5 - 5.0 g/dL   AST 50 (H) 15 - 41 U/L   ALT 21 0 - 44 U/L   Alkaline Phosphatase 81 38 - 126 U/L   Total Bilirubin 1.1 0.3 - 1.2 mg/dL   GFR calc non Af Amer 55 (L) >60 mL/min   GFR calc Af Amer >60 >60 mL/min   Anion gap 9 5 - 15    Comment: Performed at [facility], [address] [place], [place] [address]  CBC with  Differential/Platelet     Status: Abnormal   Collection Time: 03/07/19  4:12 PM  Result Value Ref Range   WBC 9.9 4.0 - 10.5 K/uL   RBC 5.21 4.22 - 5.81 MIL/uL   Hemoglobin 16.4 13.0 - 17.0 g/dL   HCT 88.8 28.0 - 03.4 %   MCV 96.5 80.0 - 100.0 fL   MCH 31.5 26.0 - 34.0 pg   MCHC 32.6 30.0 - 36.0 g/dL   RDW 91.7 91.5 - 05.6 %   Platelets 232 150 - 400 K/uL   nRBC 0.0 0.0 - 0.2 %   Neutrophils Relative % 76 %   Neutro Abs 7.5 1.7 - 7.7 K/uL   Lymphocytes Relative 13 %   Lymphs Abs 1.3 0.7 - 4.0 K/uL   Monocytes Relative 11 %   Monocytes Absolute 1.1 (H) 0.1 - 1.0 K/uL   Eosinophils Relative 0 %   Eosinophils Absolute 0.0 0.0 - 0.5 K/uL   Basophils Relative 0 %   Basophils Absolute 0.0 0.0 - 0.1 K/uL   Immature Granulocytes 0 %   Abs Immature Granulocytes 0.04 0.00 - 0.07 K/uL    Comment: Performed at [facility], [address], [place], [place] [address]  Phenytoin level, total     Status: Abnormal   Collection Time: 03/07/19  4:12 PM  Result Value Ref Range   Phenytoin Lvl <2.5 (L) 10.0 - 20.0 ug/mL    Comment: Performed at [facility], [address] [place], [place] [address]  Urinalysis, Complete w Microscopic     Status: Abnormal   Collection Time: 03/07/19  5:06 PM  Result Value Ref Range   Color, Urine YELLOW YELLOW   APPearance HAZY (A) CLEAR   Specific Gravity, Urine 1.021 1.005 - 1.030   pH 5.0 5.0 - 8.0   Glucose, UA NEGATIVE NEGATIVE mg/dL   Hgb urine dipstick SMALL (A) NEGATIVE   Bilirubin Urine NEGATIVE NEGATIVE   Ketones, ur 5 (A) NEGATIVE mg/dL   Protein, ur 374 (A) NEGATIVE mg/dL   Nitrite NEGATIVE NEGATIVE   Leukocytes,Ua NEGATIVE NEGATIVE   RBC / HPF 0-5 0 - 5 RBC/hpf   WBC, UA 0-5 0 - 5 WBC/hpf   Bacteria, UA NONE SEEN NONE SEEN   Squamous Epithelial / LPF 0-5 0 - 5   Mucus PRESENT     Comment: Performed at [facility], [address] [place], [place] [address]  Influenza panel by PCR  (type A & B)     Status: None   Collection Time: 03/07/19  7:24 PM  Result Value Ref Range   Influenza A By PCR NEGATIVE NEGATIVE   Influenza B By PCR NEGATIVE NEGATIVE    Comment: (NOTE) The Xpert Xpress Flu assay is intended as an aid in the diagnosis of  influenza and should not be used as a sole basis for treatment.  This  assay is FDA approved for nasopharyngeal swab specimens only. Nasal  washings and aspirates are unacceptable for Xpert Xpress Flu testing. Performed at [facility], [address] [place], [place] [address]   Phenytoin level, total     Status: Abnormal   Collection Time: 03/07/19  8:00 PM  Result Value Ref Range   Phenytoin Lvl <2.5 (L) 10.0 - 20.0 ug/mL    Comment: Performed at [facility], [address] [place], [place] [address]  Ethanol     Status: None   Collection Time: 03/07/19  8:20 PM  Result Value Ref Range   Alcohol, Ethyl (B) <10 <10 mg/dL    Comment: (NOTE) Lowest detectable limit for serum alcohol is 10 mg/dL. For medical purposes only. Performed at [facility], [address] [place], [place] [address]    Dg Chest 2 View  Result Date: 03/07/2019 CLINICAL DATA:  Altered mental status EXAM: CHEST - 2 VIEW COMPARISON:  12/05/2017 FINDINGS: Cardiomegaly. Patchy density at the right lung base could reflect atelectasis or early infiltrate. Left lung clear. No effusions or acute bony abnormality. IMPRESSION: Cardiomegaly. Right base atelectasis or early infiltrate. Electronically Signed   By: Charlett NoseKevin  Dover M.D.   On: 03/07/2019 14:47   Ct Head Wo Contrast  Result Date: 03/07/2019 CLINICAL DATA:  68 year old male with altered mental status. Generalized weakness. EXAM: CT HEAD WITHOUT CONTRAST TECHNIQUE: Contiguous axial images were obtained from the base of the skull through the vertex without intravenous contrast. COMPARISON:  Brain MRI dated 05/25/2015 and head CT dated 05/25/2015 FINDINGS:  Evaluation of this exam is limited due to motion artifact. Brain: There is mild age-related atrophy and moderate chronic microvascular ischemic changes. There is no acute intracranial hemorrhage. No mass effect or midline shift. No extra-axial fluid collection. Vascular: No hyperdense vessel or unexpected calcification. Skull: Normal. Negative for fracture or focal lesion. Sinuses/Orbits: No acute finding. Other: None IMPRESSION: 1. No acute intracranial hemorrhage. 2. Age-related atrophy and chronic microvascular ischemic changes. Electronically Signed   By: Elgie CollardArash  Radparvar M.D.   On: 03/07/2019 16:53    Pending Labs Unresulted Labs (From admission, onward)    Start     Ordered   03/07/19 1421  CBC WITH DIFFERENTIAL  ONCE - STAT,   STAT     03/07/19 1424   Signed and Held  Hemoglobin A1c  Once,   R    Question:  Specimen collection method  Answer:  Unit=Unit collect   Signed and Held   Signed and Held  Comprehensive metabolic panel  Tomorrow morning,   STAT    Question:  Specimen collection method  Answer:  Unit=Unit collect   Signed and Held   Signed and Held  Culture, Urine  Add-on,   R     Signed and Held   Signed and Held  Rapid urine drug screen (hospital performed)  Add-on,   R     Signed and Held   Signed  and Held  HCV Ab Reflex to Quant PCR  Once,   R    Question:  Specimen collection method  Answer:  Unit=Unit collect   Signed and Held   Signed and Held  Hepatitis B surface antigen  Once,   R    Question:  Specimen collection method  Answer:  Unit=Unit collect   Signed and Held   Signed and Held  Troponin I - Add-On to previous collection  Add-on,   R    Question:  Specimen collection method  Answer:  Unit=Unit collect   Signed and Held   Signed and Held  HIV antibody (Routine Testing)  Once,   R    Question:  Specimen collection method  Answer:  Unit=Unit collect   Signed and Held   Signed and Held  CBC  (enoxaparin (LOVENOX)    CrCl >/= 30 ml/min)  Once,   R    Comments:   Baseline for enoxaparin therapy IF NOT ALREADY DRAWN.  Notify MD if PLT < 100 K.   Question:  Specimen collection method  Answer:  Unit=Unit collect   Signed and Held   Signed and Held  Creatinine, serum  (enoxaparin (LOVENOX)    CrCl >/= 30 ml/min)  Once,   R    Comments:  Baseline for enoxaparin therapy IF NOT ALREADY DRAWN.   Question:  Specimen collection method  Answer:  Unit=Unit collect   Signed and Held   Signed and Held  Creatinine, serum  (enoxaparin (LOVENOX)    CrCl >/= 30 ml/min)  Weekly,   R    Comments:  while on enoxaparin therapy   Question:  Specimen collection method  Answer:  Unit=Unit collect   Signed and Held          Vitals/Pain Today's Vitals   03/07/19 2049 03/07/19 2054 03/07/19 2058 03/07/19 2106  BP: (!) 153/120     Pulse: 74 74 73 (!) 102  Resp: 19 16 19 10   Temp: 98.6 F (37 C)     TempSrc: Oral     SpO2: 100% 100% 100% 99%  Weight:      Height:        Isolation Precautions No active isolations  Medications Medications  0.9 %  sodium chloride infusion (500 mLs Intravenous New Bag/Given 03/07/19 1910)  phenytoin (DILANTIN) ER capsule 300 mg (has no administration in time range)  labetalol (NORMODYNE) tablet 50 mg (has no administration in time range)  sodium chloride 0.9 % bolus 1,000 mL (0 mLs Intravenous Stopped 03/07/19 1615)  cefTRIAXone (ROCEPHIN) 1 g in sodium chloride 0.9 % 100 mL IVPB (0 g Intravenous Stopped 03/07/19 2000)  azithromycin (ZITHROMAX) 500 mg in sodium chloride 0.9 % 250 mL IVPB (0 mg Intravenous Stopped 03/07/19 2121)  labetalol (NORMODYNE,TRANDATE) injection 20 mg (20 mg Intravenous Given 03/07/19 1906)  fosPHENYtoin (CEREBYX) 940 mg PE in sodium chloride 0.9 % 50 mL IVPB (0 mg PE/kg  94 kg Intravenous Stopped 03/07/19 2122)    Mobility walks High fall risk   Focused Assessments Neuro Assessment Handoff:  Swallow screen pass? Yes  Cardiac Rhythm: Normal sinus rhythm   Last date known well: 03/06/19   Neuro  Assessment: Exceptions to WDL Neuro Checks:      Last Documented NIHSS Modified Score:   Has TPA been given? No If patient is a Neuro Trauma and patient is going to OR before floor call report to 4N Charge nurse: [phone] or [phone]     R Recommendations: See  Admitting Provider Note  Report given to:   Additional Notes: Confusion

## 2019-03-07 NOTE — ED Provider Notes (Signed)
[facility]-EMERGENCY DEPT Provider Note   CSN: [id number] Arrival date & time: 03/07/19  1342  LEVEL 5 CAVEAT - DEMENTIA   History   Chief Complaint Chief Complaint  Patient presents with   Altered Mental Status   Weakness    HPI Philip Robinson is a 68 y.o. male.     HPI  68 year old male presents from home with altered mental state.  History is mostly taken from the daughter over the phone.  The patient is disoriented and does not know why he is here.  According to the daughter since yesterday he has been sleepier/more lethargic than normal.  Also did not seem to recognize his wife's voice.  Seems to have less energy overall.  No known fevers, cough, shortness of breath or contact with COVID-19 patient.  The patient's bed was covered with urine this morning.  At some point he got off his bed which is low to the ground and sat on the floor.  The daughter was unable to get him up and he could not get up by himself so EMS was called.  No new medicines.  Past Medical History:  Diagnosis Date   Diabetes mellitus without complication    Hypertension     Patient Active Problem List   Diagnosis Date Noted   Adjustment disorder with mixed anxiety and depressed mood 05/27/2015   Essential hypertension, malignant    Hypokalemia    Seizure (HCC) 05/25/2015   Acute encephalopathy 05/25/2015   Delirium     No past surgical history on file.      Home Medications    Prior to Admission medications   Medication Sig Start Date End Date Taking? Authorizing Provider  amLODipine (NORVASC) 10 MG tablet Take 1 tablet (10 mg total) by mouth daily. 05/27/15   Hongalgi, Maximino Greenland, MD  hydrochlorothiazide (HYDRODIURIL) 25 MG tablet Take 1 tablet (25 mg total) by mouth daily. 05/27/15   Hongalgi, Maximino Greenland, MD  metoprolol tartrate (LOPRESSOR) 25 MG tablet Take 1 tablet (25 mg total) by mouth 2 (two) times daily. 05/27/15   Hongalgi, Maximino Greenland, MD    Family History No  family history on file.  Social History Social History   Tobacco Use   Smoking status: Current Every Day Smoker   Smokeless tobacco: Never Used  Substance Use Topics   Alcohol use: No   Drug use: No     Allergies   Patient has no known allergies.   Review of Systems Review of Systems  Unable to perform ROS: Mental status change     Physical Exam Updated Vital Signs BP (!) 154/130    Pulse 79    Temp 98.9 F (37.2 C) (Rectal)    Resp (!) 21    SpO2 100%   Physical Exam Vitals signs and nursing note reviewed.  Constitutional:      Appearance: He is well-developed. He is obese.  HENT:     Head: Normocephalic and atraumatic.     Right Ear: External ear normal.     Left Ear: External ear normal.     Nose: Nose normal.     Mouth/Throat:     Mouth: Mucous membranes are dry.  Eyes:     General:        Right eye: No discharge.        Left eye: No discharge.  Neck:     Musculoskeletal: Neck supple.  Cardiovascular:     Rate and Rhythm: Normal rate and regular  rhythm.     Heart sounds: Normal heart sounds.  Pulmonary:     Effort: Pulmonary effort is normal.     Breath sounds: Normal breath sounds.  Abdominal:     Palpations: Abdomen is soft.     Tenderness: There is no abdominal tenderness.  Skin:    General: Skin is warm and dry.  Neurological:     Mental Status: He is alert. He is disoriented.     Comments: Awake, alert, disoriented to time and place. No slurred speech. No facial droop. 5/5 strength in all 4 extremities.  Psychiatric:        Mood and Affect: Mood is not anxious.      ED Treatments / Results  Labs (all labs ordered are listed, but only abnormal results are displayed) Labs Reviewed  CBG MONITORING, ED - Abnormal; Notable for the following components:      Result Value   Glucose-Capillary 117 (*)    All other components within normal limits  BLOOD GAS, VENOUS  CBC WITH DIFFERENTIAL/PLATELET  URINALYSIS, COMPLETE (UACMP) WITH  MICROSCOPIC  AMMONIA  COMPREHENSIVE METABOLIC PANEL  CBC WITH DIFFERENTIAL/PLATELET  URINALYSIS, ROUTINE W REFLEX MICROSCOPIC    EKG EKG Interpretation  Date/Time:  Friday March 07 2019 14:47:33 EDT Ventricular Rate:  76 PR Interval:    QRS Duration: 92 QT Interval:  418 QTC Calculation: 470 R Axis:   58 Text Interpretation:  Sinus rhythm Atrial premature complex Probable left atrial enlargement Left ventricular hypertrophy Abnormal T, consider ischemia, lateral leads ST/T changes similar to June 2016 Confirmed by Pricilla Loveless [phone]) on 03/07/2019 2:56:35 PM   Radiology Dg Chest 2 View  Result Date: 03/07/2019 CLINICAL DATA:  Altered mental status EXAM: CHEST - 2 VIEW COMPARISON:  12/05/2017 FINDINGS: Cardiomegaly. Patchy density at the right lung base could reflect atelectasis or early infiltrate. Left lung clear. No effusions or acute bony abnormality. IMPRESSION: Cardiomegaly. Right base atelectasis or early infiltrate. Electronically Signed   By: Charlett Nose M.D.   On: 03/07/2019 14:47    Procedures Procedures (including critical care time)  Medications Ordered in ED Medications  sodium chloride 0.9 % bolus 1,000 mL (0 mLs Intravenous Stopped 03/07/19 1615)     Initial Impression / Assessment and Plan / ED Course  I have reviewed the triage vital signs and the nursing notes.  Pertinent labs & imaging results that were available during my care of the patient were reviewed by me and considered in my medical decision making (see chart for details).        Patient overall appears stable. No focal findings on exam. CXR equivocal, probably atelectasis. Will correlate with labs given no report of cough, fever, dyspnea at home by daughter. Labs, CT, urine pending. Care transferred to Dr. Particia Nearing.  Final Clinical Impressions(s) / ED Diagnoses   Final diagnoses:  None    ED Discharge Orders    None       Pricilla Loveless, MD 03/07/19 [phone]

## 2019-03-07 NOTE — ED Notes (Signed)
Labs recollected after speaking with Harrold Donath from lab, stating initial lab draws had hemolyzed.

## 2019-03-07 NOTE — ED Notes (Signed)
Patient transported to X-ray 

## 2019-03-07 NOTE — ED Notes (Signed)
Pt unable to ambulate. Unsteady when standing.

## 2019-03-07 NOTE — ED Triage Notes (Signed)
Pt BIBA from home c/o generalized weakness, family reports confusion.  Pt c/o polyuria.  Family denies recent travel, fever, cough.     EMS found him in floor- family reports he tried to get up from bed and slid to floor. Family denies LOC, denies he hit his head.   Family reports pt has dementia at baseline.

## 2019-03-07 NOTE — ED Notes (Signed)
Bed: WA16 Expected date:  Expected time:  Means of arrival:  Comments: EMS/AMS 

## 2019-03-07 NOTE — ED Notes (Signed)
Pt is alert, cooperative at this time.  Smells of foul smelling urine. Pt able to follow commands, unable to state what year or season it is, unable to say why he is at the hospital.

## 2019-03-07 NOTE — Progress Notes (Signed)
Spoke with Wouk,MD and he said this patient would not be tested for COVID-19. No symptoms of COVID-19 at this time. Pt accepted.

## 2019-03-07 NOTE — H&P (Addendum)
History and Physical    Philip Robinson SLH:[id number] DOB: [birth year] DOA: 03/07/2019  PCP: Hermelinda Dellen, NP  Patient coming from: home   Chief Complaint: incontinence, altered mental status  HPI: Philip Robinson is a 68 y.o. male with medical history significant for vascular dementia, hypertension, t2dm, seizure disorder, aortic dissection (type b), ascending aortic aneurysm, right iliac artery aneurysm, CVA, and tobacco abuse, who presents with above.  Patient lives with daughter Philip Robinson, bulk of history is taken from her.  She describes the patient as demented at baseline but conversant, able to recognize loved-ones, relatively active.  She says that beginning yesterday the patient was more lethargic than usual, spending more time than usual in bed, and had occasional slurred speech. This morning he awoke having urinated in the bed; that is not a common occurrence.   She and the patient deny fevers, deny complaints of pain, deny cough or difficulty breathing, deny recent travel or known covid exposure. Denies urinary frequency or hematuria or dysuria. Occasional marijuana use but daughter says no other drugs, no alcohol. Does smoke cigarettes. No witnessed seizure like activity or falls. No complaints of chest or abdominal pain. No history of SI or daughter concern for intentional or accidental ingestion. No focal weakness or numbness.   Daughter says patient has been out of most medications. For the past at least several weeks, has only been taking lisinopril and aspirin daily, she is unsure of dose.   ED Course: labs, imaging, 1 L NS, ceftriaxone/azithromycin  Review of Systems: As per HPI otherwise 10 point review of systems negative.    Past Medical History:  Diagnosis Date   Diabetes mellitus without complication    Hypertension   see above  No past surgical history on file.  Tobacco abuse and MJ use  No Known Allergies  No family history on file.  Prior to  Admission medications   Medication Sig Start Date End Date Taking? Authorizing Provider  aspirin 81 MG chewable tablet Chew 81 mg by mouth daily.   Yes [provider]  lisinopril (PRINIVIL,ZESTRIL) 20 MG tablet Take 20 mg by mouth daily.   Yes [provider]    Physical Exam: Vitals:   03/07/19 1800 03/07/19 1908 03/07/19 1909 03/07/19 1921  BP: (!) 183/114 (!) 158/104  (!) 172/80  Pulse: 93 78 78 63  Resp:  20  (!) 22  Temp:      TempSrc:      SpO2: 95% 96% 96% 97%    Constitutional: No acute distress Head: Atraumatic Eyes: Conjunctiva clear ENM: Moist mucous membranes. poordentition.  Neck: Supple Respiratory: Clear to auscultation bilaterally, no wheezing/rales/rhonchi. Normal respiratory effort. No accessory muscle use. . Cardiovascular: Regular rate and rhythm. Moderate systolic murmur Abdomen: Non-tender, non-distended. No masses. No rebound or guarding. Positive bowel sounds. Musculoskeletal: No joint deformity upper and lower extremities. Normal ROM, no contractures. Normal muscle tone.  Skin: No rashes, lesions, or ulcers.  Extremities: No peripheral edema. Palpable peripheral pulses. Neurologic: Alert, moving all 4 extremities. 5/5 upper and lower strength. Cn 2-12 grossly intact. Distal sensation to light touch intact Psychiatric: Normal insight and judgement.   Labs on Admission: I have personally reviewed following labs and imaging studies  CBC: Recent Labs  Lab 03/07/19 1612  WBC 9.9  NEUTROABS 7.5  HGB 16.4  HCT 50.3  MCV 96.5  PLT 232   Basic Metabolic Panel: Recent Labs  Lab 03/07/19 1612  NA 136  K 4.2  CL 102  CO2 25  GLUCOSE 118*  BUN 23  CREATININE 1.31*  CALCIUM 9.2   GFR: CrCl cannot be calculated (Unknown ideal weight.). Liver Function Tests: Recent Labs  Lab 03/07/19 1612  AST 50*  ALT 21  ALKPHOS 81  BILITOT 1.1  PROT 8.3*  ALBUMIN 4.1   No results for input(s): LIPASE, AMYLASE in the last 168  hours. Recent Labs  Lab 03/07/19 1612  AMMONIA 13   Coagulation Profile: No results for input(s): INR, PROTIME in the last 168 hours. Cardiac Enzymes: No results for input(s): CKTOTAL, CKMB, CKMBINDEX, TROPONINI in the last 168 hours. BNP (last 3 results) No results for input(s): PROBNP in the last 8760 hours. HbA1C: No results for input(s): HGBA1C in the last 72 hours. CBG: Recent Labs  Lab 03/07/19 1454  GLUCAP 117*   Lipid Profile: No results for input(s): CHOL, HDL, LDLCALC, TRIG, CHOLHDL, LDLDIRECT in the last 72 hours. Thyroid Function Tests: No results for input(s): TSH, T4TOTAL, FREET4, T3FREE, THYROIDAB in the last 72 hours. Anemia Panel: No results for input(s): VITAMINB12, FOLATE, FERRITIN, TIBC, IRON, RETICCTPCT in the last 72 hours. Urine analysis:    Component Value Date/Time   COLORURINE YELLOW 03/07/2019 1706   APPEARANCEUR HAZY (A) 03/07/2019 1706   LABSPEC 1.021 03/07/2019 1706   PHURINE 5.0 03/07/2019 1706   GLUCOSEU NEGATIVE 03/07/2019 1706   HGBUR SMALL (A) 03/07/2019 1706   BILIRUBINUR NEGATIVE 03/07/2019 1706   KETONESUR 5 (A) 03/07/2019 1706   PROTEINUR 100 (A) 03/07/2019 1706   NITRITE NEGATIVE 03/07/2019 1706   LEUKOCYTESUR NEGATIVE 03/07/2019 1706    Radiological Exams on Admission: Dg Chest 2 View  Result Date: 03/07/2019 CLINICAL DATA:  Altered mental status EXAM: CHEST - 2 VIEW COMPARISON:  12/05/2017 FINDINGS: Cardiomegaly. Patchy density at the right lung base could reflect atelectasis or early infiltrate. Left lung clear. No effusions or acute bony abnormality. IMPRESSION: Cardiomegaly. Right base atelectasis or early infiltrate. Electronically Signed   By: Charlett Nose M.D.   On: 03/07/2019 14:47   Ct Head Wo Contrast  Result Date: 03/07/2019 CLINICAL DATA:  68 year old male with altered mental status. Generalized weakness. EXAM: CT HEAD WITHOUT CONTRAST TECHNIQUE: Contiguous axial images were obtained from the base of the skull through  the vertex without intravenous contrast. COMPARISON:  Brain MRI dated 05/25/2015 and head CT dated 05/25/2015 FINDINGS: Evaluation of this exam is limited due to motion artifact. Brain: There is mild age-related atrophy and moderate chronic microvascular ischemic changes. There is no acute intracranial hemorrhage. No mass effect or midline shift. No extra-axial fluid collection. Vascular: No hyperdense vessel or unexpected calcification. Skull: Normal. Negative for fracture or focal lesion. Sinuses/Orbits: No acute finding. Other: None IMPRESSION: 1. No acute intracranial hemorrhage. 2. Age-related atrophy and chronic microvascular ischemic changes. Electronically Signed   By: Elgie Collard M.D.   On: 03/07/2019 16:53    EKG: Independently reviewed. Lvh, lateral lead twis stable from prior  Assessment/Plan Principal Problem:   Acute encephalopathy Active Problems:   Seizure (HCC)   Essential hypertension, malignant   Aortic dissection (HCC)   Ascending aortic aneurysm (HCC)   Iliac artery aneurysm (HCC)   Vascular dementia (HCC)   Nicotine abuse   CVA (cerebral vascular accident) (HCC)   # Acute encephalopathy - in setting of not taking anti-epileptics, think likely post-ictal. Discussed w/ Dr. Amada Jupiter of neurology, who agrees. Plan to phenytoin load and resume home antiepileptic, admit to [facility] for AM EEG. Labs significant only for mild AKI at this point.  CT of head showing no acute findings and no focal findings on neuro exam. BP is significantly elevated in the setting of not taking most of his home medications. Low concern for infection - afebrile, no respiratory symptoms, urinalysis not suggestive of uti, not in pain. Daughter says not back to baseline but pt is alert and not in distress. - fosphenytoin load 10 mg/kg, resume home dose (phenytoin 300 qhs) - AM ECG - admit to [facility] - seizure precautions - f/u UDS, ethanol level - f/u urine culture - f/u troponin - telemetry  overnight  # Right lower lobe infiltrate - vs atelectasis on CXR. Asymptomatic, afebrile, lungs clear to auscultation. In setting of community spread of covid. No known covid contacts. Received one dose ceftriaxone and azithromycin in ED. - checking flu swab out of abundance of caution - given absence of symptoms does not meet criteria for covid testing at this time, but would monitor closely for development - similarly deferring continuation of abx given absence of symptoms  # HTN - here BP severely elevated. Daughter says at home just taking lisinopril daily; previously prescribed hydralazine, amlodipine, labetalol as well. - resume home amlodipine and hydralazine and labetalol; hole lisinopril given aki  # Acute kidney injury - cr 1.31 from baseline of around one, favor prerenal 2/2 decreased po. S/p 1 l ns in the ED - am CMP, holding lisinopril as above  # history aortic dissection (type b), ascending aortic aneurysm, right iliac artery aneurysm: saw [facility] ct surgery earlier this year, CT showed some increase in size of ascending aneurysm, rec was surveillance scan in 1 year, smoking cessation, bp control. Today asymptomatic, BP elevated - antihypertensives as above  # AST elevation - mild, no hx of. Possibly mild ischemia given aki as above - check hcv, hbv. Repeat cmp in am  # T2DM - a1c 6.9 05/2018. Glucose random on admission wnl - f/u a1c, am fasting glucose with cmp    DVT prophylaxis: lovenox Code Status: full, confirmed w/ daughter pam  Family Communication: daughter Art Buff  Disposition Plan: tbd  Consults called: neurology kirkpatrick  Admission status: tele, covid r/o    Silvano Bilis MD [facility] Pager [phone]  If 7PM-7AM, please contact night-coverage [email or web address] Password [facility]  03/07/2019, 7:40 PM

## 2019-03-07 NOTE — ED Provider Notes (Signed)
Pt signed out by Dr. Criss Alvine.  Pt does not have a UTI.  He does have a possible RLL CAP.  He was given rocephin/zithromax.  Pt still unable to stand up by himself.  This is not his baseline.  Pt is also hypertensive, so I will give him some labetalol.  He has not had any of his meds today.  Pt d/w Dr. Ashok Pall for admission.   Jacalyn Lefevre, MD 03/07/19 1859

## 2019-03-07 NOTE — ED Notes (Signed)
Lab called at this time to run Add on labs.

## 2019-03-08 ENCOUNTER — Inpatient Hospital Stay (HOSPITAL_COMMUNITY): Payer: Medicare Other

## 2019-03-08 LAB — COMPREHENSIVE METABOLIC PANEL
ALT: 27 U/L (ref 0–44)
AST: 67 U/L — ABNORMAL HIGH (ref 15–41)
Albumin: 3.7 g/dL (ref 3.5–5.0)
Alkaline Phosphatase: 76 U/L (ref 38–126)
Anion gap: 13 (ref 5–15)
BUN: 25 mg/dL — ABNORMAL HIGH (ref 8–23)
CO2: 21 mmol/L — ABNORMAL LOW (ref 22–32)
Calcium: 9.4 mg/dL (ref 8.9–10.3)
Chloride: 104 mmol/L (ref 98–111)
Creatinine, Ser: 1.43 mg/dL — ABNORMAL HIGH (ref 0.61–1.24)
GFR calc Af Amer: 57 mL/min — ABNORMAL LOW (ref >60)
GFR calc non Af Amer: 49 mL/min — ABNORMAL LOW (ref >60)
Glucose, Bld: 123 mg/dL — ABNORMAL HIGH (ref 70–99)
Potassium: 4.3 mmol/L (ref 3.5–5.1)
Sodium: 138 mmol/L (ref 135–145)
Total Bilirubin: 1.1 mg/dL (ref 0.3–1.2)
Total Protein: 7.7 g/dL (ref 6.5–8.1)

## 2019-03-08 LAB — CBC
HCT: 46 % (ref 39.0–52.0)
Hemoglobin: 15.5 g/dL (ref 13.0–17.0)
MCH: 31.4 pg (ref 26.0–34.0)
MCHC: 33.7 g/dL (ref 30.0–36.0)
MCV: 93.1 fL (ref 80.0–100.0)
Platelets: 220 10*3/uL (ref 150–400)
RBC: 4.94 MIL/uL (ref 4.22–5.81)
RDW: 12.9 % (ref 11.5–15.5)
WBC: 9.1 10*3/uL (ref 4.0–10.5)
nRBC: 0 % (ref 0.0–0.2)

## 2019-03-08 LAB — CREATININE, SERUM
Creatinine, Ser: 1.6 mg/dL — ABNORMAL HIGH (ref 0.61–1.24)
GFR calc Af Amer: 50 mL/min — ABNORMAL LOW (ref >60)
GFR calc non Af Amer: 43 mL/min — ABNORMAL LOW (ref >60)

## 2019-03-08 LAB — GLUCOSE, CAPILLARY
Glucose-Capillary: 156 mg/dL — ABNORMAL HIGH (ref 70–99)
Glucose-Capillary: 113 mg/dL — ABNORMAL HIGH (ref 70–99)
Glucose-Capillary: 136 mg/dL — ABNORMAL HIGH (ref 70–99)

## 2019-03-08 LAB — HEMOGLOBIN A1C
Hgb A1c MFr Bld: 6.1 % — ABNORMAL HIGH (ref 4.8–5.6)
Mean Plasma Glucose: 128.37 mg/dL

## 2019-03-08 LAB — HIV ANTIBODY (ROUTINE TESTING W REFLEX): HIV Screen 4th Generation wRfx: NONREACTIVE

## 2019-03-08 LAB — TROPONIN I
Troponin I: 0.15 ng/mL (ref <0.03)
Troponin I: 0.13 ng/mL (ref <0.03)

## 2019-03-08 MED ORDER — HYDRALAZINE HCL 20 MG/ML IJ SOLN
10.0000 mg | Freq: Four times a day (QID) | INTRAMUSCULAR | Status: DC | PRN
  Administered 2019-03-08: 23:00:00 10 mg via INTRAVENOUS
  Filled 2019-03-08: qty 1

## 2019-03-08 MED ORDER — INSULIN ASPART 100 UNIT/ML ~~LOC~~ SOLN
0.0000 [IU] | Freq: Three times a day (TID) | SUBCUTANEOUS | Status: DC
  Administered 2019-03-08: 13:00:00 2 [IU] via SUBCUTANEOUS
  Administered 2019-03-09: 1 [IU] via SUBCUTANEOUS

## 2019-03-08 MED ORDER — ZOLPIDEM TARTRATE 5 MG PO TABS
5.0000 mg | ORAL_TABLET | Freq: Once | ORAL | Status: AC
  Administered 2019-03-08: 5 mg via ORAL
  Filled 2019-03-08: qty 1

## 2019-03-08 MED ORDER — HYDRALAZINE HCL 50 MG PO TABS
50.0000 mg | ORAL_TABLET | Freq: Three times a day (TID) | ORAL | Status: DC
  Administered 2019-03-08 – 2019-03-09 (×4): 50 mg via ORAL
  Filled 2019-03-08 (×5): qty 1

## 2019-03-08 NOTE — Progress Notes (Signed)
CRITICAL VALUE ALERT  Critical Value: Troponin 0.15  Date & Time Notied:  03/08/19 0123  Provider Notified: Yes  Orders Received/Actions taken: waiting for orders

## 2019-03-08 NOTE — Procedures (Signed)
EEG Report  Clinical History:  Altered mental status in patient with baseline dementia.    Technical Summary:  A 19 channel digital EEG recording was performed using the 10-20 international system of electrode placement.  Bipolar and Referential montages were used.  The total recording time was approx 20 minutes.  Findings:  There is no posterior dominant rhythm.  Posterior background frequencies are about 7 Hz, symmetrical.  He gets drowsy, but no sleep recorded.  There is no focal slowing.  On 3 occasions, there are epileptiform discharges with maximal negativity at F8.  No electrographic seizures are present.  Impression:  This is an abnormal EEG.  There is evidence of mild generalized slowing of brain activity, which is non-specific but may due to underlying dementia, or toxic, metabolic, infectious etiologies.  There is evidence of a seizure tendency emanating from the right anterior temporal lobe putting the patient at risk of focal seizures with or without secondary generalization.  This may be due to localized stroke, abscess, tumor, or infectious etiology as some examples.   Clinical correlation is recommended.    Weston Settle, MS, MD

## 2019-03-08 NOTE — Progress Notes (Signed)
Patient arrived to the unit around 2330, he was pleasantly confused. I oriented him to the room and let him know what was going on. He reorients easily. Call bell in reach, bed locked and in lowest position, bed alarm on. He did become more confused throughout the night and pulled his condom catheter off and pulled his IV out. After getting cleaned up he has been asleep. VS have been stable and the patient has remained afebrile throughout the night.   His troponin did come back elevated at 0.15, I notified the on call MD and he ordered a add on to a recent lab draw. The MD advised me to monitor for chest pain and notify of any changes. Monitoring closely.

## 2019-03-08 NOTE — Progress Notes (Addendum)
PROGRESS NOTE                                                                                                                                                                                                             Patient Demographics:    Philip Robinson, is a 68 y.o. male, DOB - 08-Dec-1950, ZOX:[id number]  Admit date - 03/07/2019   Admitting Physician Kathrynn Running, MD  Outpatient Primary MD for the patient is Dinges, Roanna Banning, NP  LOS - 1  Chief Complaint  Patient presents with  . Altered Mental Status  . Weakness       Brief Narrative  Philip Robinson is a 68 y.o. male with medical history significant for vascular dementia, hypertension, t2dm, seizure disorder, aortic dissection (type b), ascending aortic aneurysm, right iliac artery aneurysm, CVA, and tobacco abuse, who presents with above.   Subjective:    Philip Robinson today has, No headache, No chest pain, No abdominal pain - No Nausea, No new weakness tingling or numbness, No Cough - SOB.     Assessment  & Plan :     1.  Metabolic encephalopathy with urinary incontinence.  History of seizures, patient not on seizure medications, episode suspicious for a seizure-like activity.  Case was discussed with neurology upon admission.  EEG pending, head CT unremarkable, patient placed on phenytoin, currently mentation appears close to baseline, await neuro input.  PT.  2.  RLL atelectasis.  Clinically appears atelectasis.  Monitor.  3.  History of aortic dissection.  Follows with [facility] surgery group.  CT shows some increase in size of his ascending aneurysm as compared to 1 year ago.  Counseled to quit smoking and alcohol.  Continue labetalol adjust blood pressure medications for better control thereafter follow with PCP and [facility] surgery group.  4.  Hypertension.  In poor control.  On Norvasc labetalol, hydralazine dose increased for better control.  Added PRN IV hydralazine as well.  5.  ARF versus CKD 3.   Last creatinine is from 2016 which is around 1.  Due to poorly controlled blood pressure he could have certainly progressed to CKD 3, gentle hydration and monitor.  6.  A.m. type II.  Check A1c and place on sliding scale.  7.  Mildly elevated troponin.  Downtrending, non-ACS pattern.  Chest pain-free.  Continue beta-blocker, cardiology evaluated the patient and canceled echocardiogram.    Family Communication  :  None  Code Status :  Full  Disposition Plan  :  TBD  Consults  :  Cards  Procedures  :   EEG  TTE  CT -  Non acute  DVT Prophylaxis  :  Lovenox   Lab Results  Component Value Date   PLT 220 03/08/2019    Diet :  Diet Order            Diet regular Room service appropriate? Yes; Fluid consistency: Thin  Diet effective now               Inpatient Medications Scheduled Meds: . amLODipine  10 mg Oral Daily  . aspirin  81 mg Oral Daily  . enoxaparin (LOVENOX) injection  40 mg Subcutaneous Q24H  . hydrALAZINE  50 mg Oral Q8H  . labetalol  50 mg Oral BID  . phenytoin  300 mg Oral QHS   Continuous Infusions: . sodium chloride 500 mL (03/07/19 1910)   PRN Meds:.sodium chloride, hydrALAZINE  Antibiotics  :   Anti-infectives (From admission, onward)   Start     Dose/Rate Route Frequency Ordered Stop   03/07/19 1830  cefTRIAXone (ROCEPHIN) 1 g in sodium chloride 0.9 % 100 mL IVPB     1 g 200 mL/hr over 30 Minutes Intravenous  Once 03/07/19 1829 03/07/19 2000   03/07/19 1830  azithromycin (ZITHROMAX) 500 mg in sodium chloride 0.9 % 250 mL IVPB     500 mg 250 mL/hr over 60 Minutes Intravenous  Once 03/07/19 1829 03/07/19 2121          Objective:   Vitals:   03/07/19 2136 03/07/19 2154 03/08/19 0041 03/08/19 0627  BP: (!) 164/103  (!) 144/96 (!) 180/132  Pulse: 77 72  68  Resp: 15 17  16   Temp:    98.1 F (36.7 C)  TempSrc:    Axillary  SpO2: 99% 100%  100%  Weight:      Height:        Wt Readings from Last 3 Encounters:  03/07/19 94 kg   05/25/15 93.5 kg     Intake/Output Summary (Last 24 hours) at 03/08/2019 1049 Last data filed at 03/08/2019 0938 Gross per 24 hour  Intake 1792.24 ml  Output 700 ml  Net 1092.24 ml     Physical Exam  Awake Alert, Oriented X 3, No new F.N deficits, Normal affect [place].AT,PERRAL Supple Neck,No JVD, No cervical lymphadenopathy appriciated.  Symmetrical Chest wall movement, Good air movement bilaterally, CTAB RRR,No Gallops,Rubs or new Murmurs, No Parasternal Heave +ve B.Sounds, Abd Soft, No tenderness, No organomegaly appriciated, No rebound - guarding or rigidity. No Cyanosis, Clubbing or edema, No new Rash or bruise       Data Review:    CBC Recent Labs  Lab 03/07/19 1612 03/08/19 0018  WBC 9.9 9.1  HGB 16.4 15.5  HCT 50.3 46.0  PLT 232 220  MCV 96.5 93.1  MCH 31.5 31.4  MCHC 32.6 33.7  RDW 12.8 12.9  LYMPHSABS 1.3  --   MONOABS 1.1*  --   EOSABS 0.0  --   BASOSABS 0.0  --     Chemistries  Recent Labs  Lab 03/07/19 1612 03/08/19 0018 03/08/19 0313  NA 136  --  138  K 4.2  --  4.3  CL 102  --  104  CO2 25  --  21*  GLUCOSE 118*  --  123*  BUN 23  --  25*  CREATININE 1.31* 1.60* 1.43*  CALCIUM 9.2  --  9.4  AST 50*  --  67*  ALT 21  --  27  ALKPHOS 81  --  76  BILITOT 1.1  --  1.1   ------------------------------------------------------------------------------------------------------------------ No results for input(s): CHOL, HDL, LDLCALC, TRIG, CHOLHDL, LDLDIRECT in the last 72 hours.  Lab Results  Component Value Date   HGBA1C 6.1 (H) 03/08/2019   ------------------------------------------------------------------------------------------------------------------ No results for input(s): TSH, T4TOTAL, T3FREE, THYROIDAB in the last 72 hours.  Invalid input(s): FREET3 ------------------------------------------------------------------------------------------------------------------ No results for input(s): VITAMINB12, FOLATE, FERRITIN, TIBC, IRON,  RETICCTPCT in the last 72 hours.  Coagulation profile No results for input(s): INR, PROTIME in the last 168 hours.  No results for input(s): DDIMER in the last 72 hours.  Cardiac Enzymes Recent Labs  Lab 03/08/19 0018 03/08/19 0636  TROPONINI 0.15* 0.13*   ------------------------------------------------------------------------------------------------------------------ No results found for: BNP  Micro Results No results found for this or any previous visit (from the past 240 hour(s)).  Radiology Reports Dg Chest 2 View  Result Date: 03/07/2019 CLINICAL DATA:  Altered mental status EXAM: CHEST - 2 VIEW COMPARISON:  12/05/2017 FINDINGS: Cardiomegaly. Patchy density at the right lung base could reflect atelectasis or early infiltrate. Left lung clear. No effusions or acute bony abnormality. IMPRESSION: Cardiomegaly. Right base atelectasis or early infiltrate. Electronically Signed   By: Charlett Nose M.D.   On: 03/07/2019 14:47   Ct Head Wo Contrast  Result Date: 03/07/2019 CLINICAL DATA:  68 year old male with altered mental status. Generalized weakness. EXAM: CT HEAD WITHOUT CONTRAST TECHNIQUE: Contiguous axial images were obtained from the base of the skull through the vertex without intravenous contrast. COMPARISON:  Brain MRI dated 05/25/2015 and head CT dated 05/25/2015 FINDINGS: Evaluation of this exam is limited due to motion artifact. Brain: There is mild age-related atrophy and moderate chronic microvascular ischemic changes. There is no acute intracranial hemorrhage. No mass effect or midline shift. No extra-axial fluid collection. Vascular: No hyperdense vessel or unexpected calcification. Skull: Normal. Negative for fracture or focal lesion. Sinuses/Orbits: No acute finding. Other: None IMPRESSION: 1. No acute intracranial hemorrhage. 2. Age-related atrophy and chronic microvascular ischemic changes. Electronically Signed   By: Elgie Collard M.D.   On: 03/07/2019 16:53    Time  Spent in minutes  30   Susa Raring M.D on 03/08/2019 at 10:49 AM  To page go to [email or web address] - password [facility]

## 2019-03-08 NOTE — Progress Notes (Signed)
EEG Completed; Results Pending  

## 2019-03-08 NOTE — Evaluation (Signed)
Physical Therapy Evaluation Patient Details Name: Philip Robinson MRN: [id number] DOB: 01/30/1951 Today's Date: 03/08/2019   History of Present Illness  68 yo male admitted to ED on 4/3 with AMS, from home with daughter. Pt with RLL atelectasis. Elevated troponin now downtrending, echocardiogram not to be performed. PMH includes dementia, DM, HTN, adjustment disorder, seizures, AAA with aortic dissection type B, CVA.   Clinical Impression  Pt presents with poor safety awareness, poor standing balance, difficulty performing all mobility tasks, impaired cognition with personal history of dementia, gait deviations including scissoring of gait and posterior leaning with ambulation, and poor activity tolerance at this time. Pt to benefit from acute PT to address deficits. Pt ambulated 3 ft at EOB to recliner, limited by scissoring of gait, narrow base of support unable to be corrected with verbal cuing, and posterior leaning/unsafe use of RW. Pt completely lacks safety awareness with PT this session. PT recommending SNF placement to address mobility deficits. PT to progress mobility as tolerated, and will continue to follow acutely.      Follow Up Recommendations SNF;Supervision/Assistance - 24 hour    Equipment Recommendations  Other (comment)(defer to next venue)    Recommendations for Other Services       Precautions / Restrictions Precautions Precautions: Fall Restrictions Weight Bearing Restrictions: No      Mobility  Bed Mobility Overal bed mobility: Needs Assistance Bed Mobility: Supine to Sit     Supine to sit: Mod assist;HOB elevated     General bed mobility comments: Mod assist for LE management, trunk elevation. Pt able to scoot to EOB, but requires steadying assist as pt with tendency to lean posteriorly with movement.   Transfers Overall transfer level: Needs assistance Equipment used: Rolling walker (2 wheeled) Transfers: Sit to/from Stand Sit to Stand: Mod assist;From  elevated surface         General transfer comment: Mod assist for power up, steadying. Pt with heavy posterior leaning upon standing. PT providing mod assist to maintain pt's balance, pt unaware of posterior leaning when asked and only realized when PT lessened assist. After 1 minute standing, pt able to stand within BOS.   Ambulation/Gait Ambulation/Gait assistance: Mod assist Gait Distance (Feet): 3 Feet Assistive device: Rolling walker (2 wheeled) Gait Pattern/deviations: Step-through pattern;Leaning posteriorly;Drifts right/left;Scissoring;Narrow base of support Gait velocity: decr, very unsteady    General Gait Details: Mod assist for steadying. Max verbal and tactile cuing for safety. Verbal cuing for widening BOS and avoiding scissoring, pt states "my feet aren't crossed, yours are". Pt with intermittent posterior leaning corrected with PT tactile cuing.   Stairs            Wheelchair Mobility    Modified Rankin (Stroke Patients Only)       Balance Overall balance assessment: Needs assistance Sitting-balance support: Feet supported;Bilateral upper extremity supported Sitting balance-Leahy Scale: Fair Sitting balance - Comments: Able to maintain sitting balance statically, unable to tolerate LE movements and maintain sitting balance.  Postural control: Posterior lean Standing balance support: Bilateral upper extremity supported Standing balance-Leahy Scale: Poor Standing balance comment: relies heavily on RW and PT for balance both statically and dynamically                              Pertinent Vitals/Pain Pain Assessment: No/denies pain    Home Living Family/patient expects to be discharged to:: Private residence Living Arrangements: Children(Per chart review, pt lives with daughter. Pt states he  lives in [place] with brother, but then states he lives in [place] with daughter) Available Help at Discharge: Family;Available PRN/intermittently Type of  Home: House Home Access: Level entry     Home Layout: One level Home Equipment: Other (comment)(unsure, pt unable to state)      Prior Function Level of Independence: Needs assistance   Gait / Transfers Assistance Needed: Assuming pt requires assist from daughter, Elita Quick, with whom he lives with.      Comments: Pt with history of dementia, unsure of pt's baseline status. No family member present to determine.      Hand Dominance        Extremity/Trunk Assessment   Upper Extremity Assessment Upper Extremity Assessment: Generalized weakness    Lower Extremity Assessment Lower Extremity Assessment: Generalized weakness(able to perform LAQ, active hip flexion sitting EOB. )    Cervical / Trunk Assessment Cervical / Trunk Assessment: Normal  Communication   Communication: No difficulties  Cognition Arousal/Alertness: Awake/alert Behavior During Therapy: WFL for tasks assessed/performed   Area of Impairment: Memory;Orientation;Attention;Following commands;Safety/judgement;Problem solving                 Orientation Level: Disoriented to;Place;Time;Situation Current Attention Level: Sustained Memory: Decreased short-term memory Following Commands: Follows one step commands consistently;Follows one step commands with increased time Safety/Judgement: Decreased awareness of safety;Decreased awareness of deficits   Problem Solving: Difficulty sequencing;Requires verbal cues;Requires tactile cues General Comments: Pt with a history of dementia. Pt unsure of where he is, what year it is, and gives contradictory reports on home environment.       General Comments General comments (skin integrity, edema, etc.): Pt endorses 0 falls, but is very unsteady. Unsure of reliability of pt report. BP and HR in supine 192/103 and 76 bpm respectively, RN notified. SpO2 100% on RA.     Exercises     Assessment/Plan    PT Assessment Patient needs continued PT services  PT Problem  List Decreased strength;Decreased mobility;Decreased safety awareness;Decreased activity tolerance;Decreased cognition;Decreased balance;Cardiopulmonary status limiting activity;Decreased knowledge of use of DME       PT Treatment Interventions DME instruction;Functional mobility training;Balance training;Patient/family education;Therapeutic activities;Gait training;Therapeutic exercise    PT Goals (Current goals can be found in the Care Plan section)  Acute Rehab PT Goals Patient Stated Goal: none stated  PT Goal Formulation: With patient Time For Goal Achievement: 03/22/19 Potential to Achieve Goals: Good    Frequency Min 2X/week   Barriers to discharge        Co-evaluation               AM-PAC PT "6 Clicks" Mobility  Outcome Measure Help needed turning from your back to your side while in a flat bed without using bedrails?: A Lot Help needed moving from lying on your back to sitting on the side of a flat bed without using bedrails?: A Lot Help needed moving to and from a bed to a chair (including a wheelchair)?: A Lot Help needed standing up from a chair using your arms (e.g., wheelchair or bedside chair)?: A Lot Help needed to walk in hospital room?: A Lot Help needed climbing 3-5 steps with a railing? : Total 6 Click Score: 11    End of Session Equipment Utilized During Treatment: Gait belt Activity Tolerance: Patient tolerated treatment well;Patient limited by fatigue Patient left: in chair;with chair alarm set;with call bell/phone within reach Nurse Communication: Mobility status PT Visit Diagnosis: Unsteadiness on feet (R26.81);Other abnormalities of gait and mobility (R26.89);Muscle weakness (generalized) (  M62.81)    Time: 7517-0017 PT Time Calculation (min) (ACUTE ONLY): 25 min   Charges:   PT Evaluation $PT Eval Low Complexity: 1 Low PT Treatments $Gait Training: 8-22 mins       Nicola Police, PT Acute Rehabilitation Services Pager [phone]  Office  [phone]   Zanya Lindo D Despina Hidden 03/08/2019, 12:52 PM

## 2019-03-08 NOTE — Progress Notes (Signed)
Attending cardiology review of echo order:  Due to the spread of COVID-19, departmental policy is to review orders for procedures and determine appropriateness regarding testing at this time. The following criteria are being used to limit potential exposure and spread of infection.  Is the patient being evaluated for COVID-19 infection: no  Is the patient actively having infectious respiratory symptoms or fever: treated for RLL in ER based on chest x-ray, ABx not continued due to lack of symptoms Does the patient have a known history of prior cardiovascular disease: no MI/CAD, but TAA, iliac aneurysm, prior aortic dissection type B, CVA Would the test change management of the patient: no Can the test be performed at a later time: yes  Special circumstances: Is the patient undergoing evaluation for embolic CVA: no Is the patient planned for chemotherapy: no  Based on the review above, this study is not to be performed at this time.  Chart reviewed. No chest pain. Admitted for AMS, incidentally elevated troponin, trend flat to slightly decreasing. Given lack of symptoms, echo would not change management at this time. Would consider re-evaluating need for echo as an outpatient. If he develops chest pain or clinical situation changes, please contact the echo lab or cardiology on call and we will re-evaluate.  Jodelle Red, MD, PhD [facility]  [address], [address] [place], [place] [address] [phone]

## 2019-03-09 LAB — BASIC METABOLIC PANEL
Anion gap: 10 (ref 5–15)
BUN: 26 mg/dL — ABNORMAL HIGH (ref 8–23)
CO2: 22 mmol/L (ref 22–32)
Calcium: 9.2 mg/dL (ref 8.9–10.3)
Chloride: 103 mmol/L (ref 98–111)
Creatinine, Ser: 1.27 mg/dL — ABNORMAL HIGH (ref 0.61–1.24)
GFR calc Af Amer: >60 mL/min (ref >60)
GFR calc non Af Amer: 57 mL/min — ABNORMAL LOW (ref >60)
Glucose, Bld: 135 mg/dL — ABNORMAL HIGH (ref 70–99)
Potassium: 3.7 mmol/L (ref 3.5–5.1)
Sodium: 135 mmol/L (ref 135–145)

## 2019-03-09 LAB — CBC
HCT: 43.2 % (ref 39.0–52.0)
Hemoglobin: 14.6 g/dL (ref 13.0–17.0)
MCH: 32.2 pg (ref 26.0–34.0)
MCHC: 33.8 g/dL (ref 30.0–36.0)
MCV: 95.2 fL (ref 80.0–100.0)
Platelets: 202 10*3/uL (ref 150–400)
RBC: 4.54 MIL/uL (ref 4.22–5.81)
RDW: 13.3 % (ref 11.5–15.5)
WBC: 7.7 10*3/uL (ref 4.0–10.5)
nRBC: 0 % (ref 0.0–0.2)

## 2019-03-09 LAB — HEPATITIS B SURFACE ANTIGEN: Hepatitis B Surface Ag: NEGATIVE

## 2019-03-09 LAB — GLUCOSE, CAPILLARY: Glucose-Capillary: 137 mg/dL — ABNORMAL HIGH (ref 70–99)

## 2019-03-09 MED ORDER — HYDRALAZINE HCL 50 MG PO TABS: 50.0000 mg | ORAL_TABLET | Freq: Three times a day (TID) | ORAL | 0 refills | Status: DC

## 2019-03-09 MED ORDER — LABETALOL HCL 100 MG PO TABS: 50.0000 mg | ORAL_TABLET | Freq: Two times a day (BID) | ORAL | 0 refills | Status: AC

## 2019-03-09 MED ORDER — PHENYTOIN SODIUM EXTENDED 300 MG PO CAPS: 300.0000 mg | ORAL_CAPSULE | Freq: Every day | ORAL | 0 refills | Status: DC

## 2019-03-09 NOTE — Discharge Summary (Signed)
Philip Robinson SLH:[id number] DOB: November 20, 1951 DOA: 03/07/2019  PCP: Hermelinda Dellen, NP  Admit date: 03/07/2019  Discharge date: 03/09/2019  Admitted From:  Home   Disposition:  Home   Recommendations for Outpatient Follow-up:   Follow up with PCP in 1-2 weeks  PCP Please obtain BMP/CBC, 2 view CXR in 1week,  (see Discharge instructions)   PCP Please follow up on the following pending results: Follow CT results, needs outpatient follow-up with neurology and cardiothoracic surgeon which he follows with at [facility] within 1 to 2 weeks.   Home Health: PT, RN   Equipment/Devices: None  Consultations: Neuro over phone Discharge Condition: Stable   CODE STATUS: Full   Diet Recommendation: Heart Healthy    Chief Complaint  Patient presents with   Altered Mental Status   Weakness     Brief history of present illness from the day of admission and additional interim summary    Philip Charlesis a 68 y.o.malewith medical history significant forvascular dementia, hypertension, t2dm, seizure disorder, aortic dissection (type b), ascending aortic aneurysm, right iliac artery aneurysm, CVA, and tobacco abuse, who presents with above.                                                                 Hospital Course    1.  Metabolic encephalopathy with urinary incontinence suspicious for seizure-like activity.    Patient apparently has history of seizures but was not taking any seizure medications, he had an unremarkable head CT, EEG showing suspicious possible seizure focus in the right frontal lobe, case discussed with neurologist Dr. Otelia Limes in detail, patient to be discharged on oral Dilantin on which he was loaded IV with the driving restrictions, he did qualify for SNF but refuses to be placed.  Will be discharged with  home PT RN, does not want any DME's.  States he lives with his daughter and would like to go home, will be discharged back with outpatient PCP and neurology follow.  He was provided with return instructions not to drive for 6 months.  2.  RLL atelectasis.  Clinically appears atelectasis.  Monitor.  3.  History of aortic dissection.  Follows with [facility] surgery group.  CT shows some increase in size of his ascending aneurysm as compared to 1 year ago.  Counseled to quit smoking and alcohol.  Continue labetalol adjust blood pressure medications for better control thereafter follow with PCP and [facility] surgery group.  4.  Hypertension.  In poor control.  Pressure medications adjusted, have added labetalol and hydralazine to his home regimen with outpatient follow-up with PCP for further monitoring and adjustment as needed.  5.  ARF versus CKD 3.  Last creatinine is from 2016 which is around 1.    Renal function has improved creatinine of 1.3 this could  be his new baseline, PCP will monitor post discharge.  6.  Mildly elevated troponin.  Downtrending, non-ACS pattern.  Chest pain-free.    EKG stable, continue beta-blocker, cardiology evaluated the patient and canceled echocardiogram.   No Dm2.  Lab Results  Component Value Date   HGBA1C 6.1 (H) 03/08/2019      Discharge diagnosis     Principal Problem:   Acute encephalopathy Active Problems:   Seizure (HCC)   Essential hypertension, malignant   Aortic dissection (HCC)   Ascending aortic aneurysm (HCC)   Iliac artery aneurysm (HCC)   Vascular dementia (HCC)   Nicotine abuse   CVA (cerebral vascular accident) [facility])    Discharge instructions    Discharge Instructions    Diet - low sodium heart healthy   Complete by:  As directed    Discharge instructions   Complete by:  As directed    Do not drive for 6 months, resume driving after 6 months only once you are cleared by a Neurologist.  Follow with Primary MD Dinges, Roanna Banning, NP in 7 days   Get CBC, CMP, 2 view Chest X ray -  checked  by Primary MD  in 5-7 days   Activity: As tolerated with Full fall precautions use walker/cane & assistance as needed  Disposition Home    Diet: Heart Healthy    Special Instructions: If you have smoked or chewed Tobacco  in the last 2 yrs please stop smoking, stop any regular Alcohol  and or any Recreational drug use.  On your next visit with your primary care physician please Get Medicines reviewed and adjusted.  Please request your Prim.MD to go over all Hospital Tests and Procedure/Radiological results at the follow up, please get all Hospital records sent to your Prim MD by signing hospital release before you go home.  If you experience worsening of your admission symptoms, develop shortness of breath, life threatening emergency, suicidal or homicidal thoughts you must seek medical attention immediately by calling 911 or calling your MD immediately  if symptoms less severe.  You Must read complete instructions/literature along with all the possible adverse reactions/side effects for all the Medicines you take and that have been prescribed to you. Take any new Medicines after you have completely understood and accpet all the possible adverse reactions/side effects.   Increase activity slowly   Complete by:  As directed       Discharge Medications   Allergies as of 03/09/2019   No Known Allergies     Medication List    TAKE these medications   aspirin 81 MG chewable tablet Chew 81 mg by mouth daily.   hydrALAZINE 50 MG tablet Commonly known as:  APRESOLINE Take 1 tablet (50 mg total) by mouth 3 (three) times daily.   labetalol 100 MG tablet Commonly known as:  NORMODYNE Take 0.5 tablets (50 mg total) by mouth 2 (two) times daily.   lisinopril 20 MG tablet Commonly known as:  PRINIVIL,ZESTRIL Take 20 mg by mouth daily.   phenytoin 300 MG ER capsule Commonly known as:  DILANTIN Take 1 capsule (300 mg  total) by mouth at bedtime.       Follow-up Information    Dinges, Roanna Banning, NP. Schedule an appointment as soon as possible for a visit in 1 week(s).   Why:  and with your CardioThoracic surgery at [facility] in 1 week for Aneurysm  Contact information: [address] [place] [address] [phone]  [facility]. Schedule an appointment as soon as possible for a visit in 1 week(s).   Contact information: [address]     [address] [place] [address] [phone]          Major procedures and Radiology Reports - PLEASE review detailed and final reports thoroughly  -       Dg Chest 2 View  Result Date: 03/07/2019 CLINICAL DATA:  Altered mental status EXAM: CHEST - 2 VIEW COMPARISON:  12/05/2017 FINDINGS: Cardiomegaly. Patchy density at the right lung base could reflect atelectasis or early infiltrate. Left lung clear. No effusions or acute bony abnormality. IMPRESSION: Cardiomegaly. Right base atelectasis or early infiltrate. Electronically Signed   By: Charlett Nose M.D.   On: 03/07/2019 14:47   Ct Head Wo Contrast  Result Date: 03/07/2019 CLINICAL DATA:  68 year old male with altered mental status. Generalized weakness. EXAM: CT HEAD WITHOUT CONTRAST TECHNIQUE: Contiguous axial images were obtained from the base of the skull through the vertex without intravenous contrast. COMPARISON:  Brain MRI dated 05/25/2015 and head CT dated 05/25/2015 FINDINGS: Evaluation of this exam is limited due to motion artifact. Brain: There is mild age-related atrophy and moderate chronic microvascular ischemic changes. There is no acute intracranial hemorrhage. No mass effect or midline shift. No extra-axial fluid collection. Vascular: No hyperdense vessel or unexpected calcification. Skull: Normal. Negative for fracture or focal lesion. Sinuses/Orbits: No acute finding. Other: None IMPRESSION: 1. No acute intracranial hemorrhage. 2. Age-related  atrophy and chronic microvascular ischemic changes. Electronically Signed   By: Elgie Collard M.D.   On: 03/07/2019 16:53    Micro Results     No results found for this or any previous visit (from the past 240 hour(s)).  Today   Subjective    Philip Robinson today has no headache,no chest abdominal pain,no new weakness tingling or numbness, feels much better wants to go home today.     Objective   Blood pressure (!) 146/86, pulse 85, temperature 98.2 F (36.8 C), temperature source Oral, resp. rate 16, height 5' 9.5" (1.765 m), weight 94 kg, SpO2 96 %.   Intake/Output Summary (Last 24 hours) at 03/09/2019 0927 Last data filed at 03/09/2019 0300 Gross per 24 hour  Intake --  Output 600 ml  Net -600 ml    Exam  Awake Alert, Oriented x 3, No new F.N deficits, Normal affect [place].AT,PERRAL Supple Neck,No JVD, No cervical lymphadenopathy appriciated.  Symmetrical Chest wall movement, Good air movement bilaterally, CTAB RRR,No Gallops,Rubs or new Murmurs, No Parasternal Heave +ve B.Sounds, Abd Soft, Non tender, No organomegaly appriciated, No rebound -guarding or rigidity. No Cyanosis, Clubbing or edema, No new Rash or bruise   Data Review   CBC w Diff:  Lab Results  Component Value Date   WBC 7.7 03/09/2019   HGB 14.6 03/09/2019   HCT 43.2 03/09/2019   PLT 202 03/09/2019   LYMPHOPCT 13 03/07/2019   MONOPCT 11 03/07/2019   EOSPCT 0 03/07/2019   BASOPCT 0 03/07/2019    CMP:  Lab Results  Component Value Date   NA 135 03/09/2019   K 3.7 03/09/2019   CL 103 03/09/2019   CO2 22 03/09/2019   BUN 26 (H) 03/09/2019   CREATININE 1.27 (H) 03/09/2019   PROT 7.7 03/08/2019   ALBUMIN 3.7 03/08/2019   BILITOT 1.1 03/08/2019   ALKPHOS 76 03/08/2019   AST 67 (H) 03/08/2019   ALT 27 03/08/2019  .   Total Time in preparing paper  work, Patent examinerdata evaluation and todays exam - 35 minutes  Susa RaringPrashant Abbas Beyene M.D on 03/09/2019 at 9:27 AM  [facility]   Office  [phone]

## 2019-03-09 NOTE — TOC Transition Note (Signed)
Transition of Care [facility]) - CM/SW Discharge Note   Patient Details  Name: Philip Robinson MRN: [id number] Date of Birth: 27-Jul-1951  Transition of Care [facility]) CM/SW Contact:  Lawerance Sabal, RN Phone Number: 03/09/2019, 9:52 AM   Clinical Narrative:    Spoke w patient at bedside, he states he lives at home w his wife. Discussed HH recs, he refuses HH and DME.     Final next level of care: Home/Self Care Barriers to Discharge: No Barriers Identified   Patient Goals and CMS Choice Patient states their goals for this hospitalization and ongoing recovery are:: wants to go home CMS Medicare.gov Compare Post Acute Care list provided to:: Patient Choice offered to / list presented to : Patient  Discharge Placement                       Discharge Plan and Services                    HH Arranged: Patient Refused [facility]     Social Determinants of Health (SDOH) Interventions     Readmission Risk Interventions No flowsheet data found.

## 2019-03-09 NOTE — Progress Notes (Signed)
Nsg Discharge Note  Admit Date:  03/07/2019 Discharge date: 03/09/2019   Philip Robinson to be D/C'd Home per MD order.  AVS completed.  Copy for chart, and copy for patient signed, and dated. Patient/caregiver able to verbalize understanding.  Discharge Medication: Allergies as of 03/09/2019   No Known Allergies     Medication List    TAKE these medications   aspirin 81 MG chewable tablet Chew 81 mg by mouth daily.   hydrALAZINE 50 MG tablet Commonly known as:  APRESOLINE Take 1 tablet (50 mg total) by mouth 3 (three) times daily.   labetalol 100 MG tablet Commonly known as:  NORMODYNE Take 0.5 tablets (50 mg total) by mouth 2 (two) times daily.   lisinopril 20 MG tablet Commonly known as:  PRINIVIL,ZESTRIL Take 20 mg by mouth daily.   phenytoin 300 MG ER capsule Commonly known as:  DILANTIN Take 1 capsule (300 mg total) by mouth at bedtime.       Discharge Assessment: Vitals:   03/09/19 0530 03/09/19 0631  BP: (!) 135/101 (!) 146/86  Pulse: 85   Resp: 16   Temp: 98.2 F (36.8 C)   SpO2: 96%    Skin clean, dry and intact without evidence of skin break down, no evidence of skin tears noted. IV catheter discontinued intact. Site without signs and symptoms of complications - no redness or edema noted at insertion site, patient denies c/o pain - only slight tenderness at site.  Dressing with slight pressure applied.  D/c Instructions-Education: Discharge instructions given to patient/family with verbalized understanding. D/c education completed with patient/family including follow up instructions, medication list, d/c activities limitations if indicated, with other d/c instructions as indicated by MD - patient/family able to verbalize understanding, all questions fully answered. Patient instructed to return to ED, call 911, or call MD for any changes in condition.  Patient escorted via WC, and D/C home via private auto.  Theodore Demark, RN 03/09/2019 12:10 PM

## 2019-03-09 NOTE — Discharge Instructions (Signed)
Do not drive for 6 months, resume driving after 6 months only once you are cleared by a Neurologist.  Follow with Primary MD Dinges, Roanna Banning, NP in 7 days   Get CBC, CMP, 2 view Chest X ray -  checked  by Primary MD  in 5-7 days   Activity: As tolerated with Full fall precautions use walker/cane & assistance as needed  Disposition Home    Diet: Heart Healthy    Special Instructions: If you have smoked or chewed Tobacco  in the last 2 yrs please stop smoking, stop any regular Alcohol  and or any Recreational drug use.  On your next visit with your primary care physician please Get Medicines reviewed and adjusted.  Please request your Prim.MD to go over all Hospital Tests and Procedure/Radiological results at the follow up, please get all Hospital records sent to your Prim MD by signing hospital release before you go home.  If you experience worsening of your admission symptoms, develop shortness of breath, life threatening emergency, suicidal or homicidal thoughts you must seek medical attention immediately by calling 911 or calling your MD immediately  if symptoms less severe.  You Must read complete instructions/literature along with all the possible adverse reactions/side effects for all the Medicines you take and that have been prescribed to you. Take any new Medicines after you have completely understood and accpet all the possible adverse reactions/side effects.

## 2019-03-10 LAB — HCV INTERPRETATION

## 2019-03-10 LAB — HCV AB W REFLEX TO QUANT PCR: HCV Ab: <0.1 s/co ratio (ref 0.0–0.9)

## 2019-08-29 ENCOUNTER — Emergency Department (HOSPITAL_COMMUNITY): Payer: Medicare Other

## 2019-08-29 ENCOUNTER — Encounter (HOSPITAL_COMMUNITY): Payer: Self-pay | Admitting: Emergency Medicine

## 2019-08-29 ENCOUNTER — Other Ambulatory Visit: Payer: Self-pay

## 2019-08-29 ENCOUNTER — Emergency Department (HOSPITAL_COMMUNITY)
Admission: EM | Admit: 2019-08-29 | Discharge: 2019-08-29 | Disposition: A | Discharge status: Home / Self Care | Payer: Medicare Other | Attending: Emergency Medicine | Admitting: Emergency Medicine

## 2019-08-29 DIAGNOSIS — R569 Unspecified convulsions: Secondary | ICD-10-CM | POA: Insufficient documentation

## 2019-08-29 DIAGNOSIS — Z20828 Contact with and (suspected) exposure to other viral communicable diseases: Secondary | ICD-10-CM | POA: Insufficient documentation

## 2019-08-29 DIAGNOSIS — Z7984 Long term (current) use of oral hypoglycemic drugs: Secondary | ICD-10-CM | POA: Insufficient documentation

## 2019-08-29 DIAGNOSIS — E119 Type 2 diabetes mellitus without complications: Secondary | ICD-10-CM | POA: Insufficient documentation

## 2019-08-29 DIAGNOSIS — F17210 Nicotine dependence, cigarettes, uncomplicated: Secondary | ICD-10-CM | POA: Diagnosis not present

## 2019-08-29 DIAGNOSIS — Z79899 Other long term (current) drug therapy: Secondary | ICD-10-CM | POA: Insufficient documentation

## 2019-08-29 DIAGNOSIS — Z7982 Long term (current) use of aspirin: Secondary | ICD-10-CM | POA: Insufficient documentation

## 2019-08-29 DIAGNOSIS — I10 Essential (primary) hypertension: Secondary | ICD-10-CM | POA: Diagnosis not present

## 2019-08-29 DIAGNOSIS — R7889 Finding of other specified substances, not normally found in blood: Secondary | ICD-10-CM

## 2019-08-29 LAB — TROPONIN I (HIGH SENSITIVITY)
Troponin I (High Sensitivity): 18 ng/L — ABNORMAL HIGH (ref <18)
Troponin I (High Sensitivity): 15 ng/L (ref <18)

## 2019-08-29 LAB — URINALYSIS, ROUTINE W REFLEX MICROSCOPIC
Bacteria, UA: NONE SEEN
Bilirubin Urine: NEGATIVE
Glucose, UA: NEGATIVE mg/dL
Hgb urine dipstick: NEGATIVE
Ketones, ur: NEGATIVE mg/dL
Leukocytes,Ua: NEGATIVE
Nitrite: NEGATIVE
Protein, ur: 100 mg/dL — AB
Specific Gravity, Urine: 1.014 (ref 1.005–1.030)
pH: 5 (ref 5.0–8.0)

## 2019-08-29 LAB — COMPREHENSIVE METABOLIC PANEL
ALT: 13 U/L (ref 0–44)
AST: 15 U/L (ref 15–41)
Albumin: 4.3 g/dL (ref 3.5–5.0)
Alkaline Phosphatase: 88 U/L (ref 38–126)
Anion gap: 9 (ref 5–15)
BUN: 15 mg/dL (ref 8–23)
CO2: 28 mmol/L (ref 22–32)
Calcium: 9.2 mg/dL (ref 8.9–10.3)
Chloride: 101 mmol/L (ref 98–111)
Creatinine, Ser: 1.01 mg/dL (ref 0.61–1.24)
GFR calc Af Amer: >60 mL/min (ref >60)
GFR calc non Af Amer: >60 mL/min (ref >60)
Glucose, Bld: 95 mg/dL (ref 70–99)
Potassium: 4 mmol/L (ref 3.5–5.1)
Sodium: 138 mmol/L (ref 135–145)
Total Bilirubin: 0.9 mg/dL (ref 0.3–1.2)
Total Protein: 8.3 g/dL — ABNORMAL HIGH (ref 6.5–8.1)

## 2019-08-29 LAB — CBC WITH DIFFERENTIAL/PLATELET
Abs Immature Granulocytes: 0.01 10*3/uL (ref 0.00–0.07)
Basophils Absolute: 0 10*3/uL (ref 0.0–0.1)
Basophils Relative: 1 %
Eosinophils Absolute: 0.2 10*3/uL (ref 0.0–0.5)
Eosinophils Relative: 4 %
HCT: 44.6 % (ref 39.0–52.0)
Hemoglobin: 14.3 g/dL (ref 13.0–17.0)
Immature Granulocytes: 0 %
Lymphocytes Relative: 16 %
Lymphs Abs: 0.7 10*3/uL (ref 0.7–4.0)
MCH: 32.4 pg (ref 26.0–34.0)
MCHC: 32.1 g/dL (ref 30.0–36.0)
MCV: 100.9 fL — ABNORMAL HIGH (ref 80.0–100.0)
Monocytes Absolute: 0.5 10*3/uL (ref 0.1–1.0)
Monocytes Relative: 12 %
Neutro Abs: 2.8 10*3/uL (ref 1.7–7.7)
Neutrophils Relative %: 67 %
Platelets: 214 10*3/uL (ref 150–400)
RBC: 4.42 MIL/uL (ref 4.22–5.81)
RDW: 12.6 % (ref 11.5–15.5)
WBC: 4.3 10*3/uL (ref 4.0–10.5)
nRBC: 0 % (ref 0.0–0.2)

## 2019-08-29 LAB — PHENYTOIN LEVEL, TOTAL: Phenytoin Lvl: <2.5 ug/mL — ABNORMAL LOW (ref 10.0–20.0)

## 2019-08-29 LAB — SARS CORONAVIRUS 2 (TAT 6-24 HRS): SARS Coronavirus 2: NEGATIVE

## 2019-08-29 MED ORDER — PHENYTOIN SODIUM 50 MG/ML IJ SOLN
300.0000 mg | Freq: Once | INTRAMUSCULAR | Status: AC
  Administered 2019-08-29: 300 mg via INTRAVENOUS
  Filled 2019-08-29 (×2): qty 6

## 2019-08-29 MED ORDER — SODIUM CHLORIDE 0.9 % IV BOLUS
1000.0000 mL | Freq: Once | INTRAVENOUS | Status: AC
  Administered 2019-08-29: 1000 mL via INTRAVENOUS

## 2019-08-29 NOTE — ED Triage Notes (Signed)
Patient arrived by EMS from home. Pt had unwitnessed seizure.   Pt fell to the ground per family member. Family member unaware of how long the seizure occurred.   Pt placed in C-collar by EMS.  Hx of Seizure, HTN.

## 2019-08-29 NOTE — Discharge Instructions (Signed)
You need to take your dilantin as prescribed. Your level was very low today   See your doctor  Return to ER if you have another seizure, lethargy, vomiting, weakness

## 2019-08-29 NOTE — ED Provider Notes (Signed)
[facility]-EMERGENCY DEPT Provider Note   CSN: [id number] Arrival date & time: 08/29/19  1308     History   Chief Complaint Chief Complaint  Patient presents with   Seizures    HPI Philip Robinson is a 68 y.o. male hx of aortic dissection, seizure, dementia who presented with seizure-like activity.  Patient is from home and daughter was in the other room and heard a big loud noise.  She went in and saw him having tonic-clonic seizure-like activity for about 5 minutes.  He has a history of seizure and is currently on Dilantin and apparently did not miss any doses.  For last 2 days, he has not been eating drinking much.  Patient has no complaints currently.  Denies any fevers or chills or sick contact.  Patient had previous admissions for seizure and was from medication noncompliance.  Per the daughter, he has been given his medicines and he has been taking it.     The history is provided by the patient and a relative.    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 No pertinent surgical history.      Home Medications    Prior to Admission medications   Medication Sig Start Date End Date Taking? Authorizing Provider  aspirin 81 MG chewable tablet Chew 81 mg by mouth daily.   Yes [provider]  glipiZIDE-metformin (METAGLIP) 2.5-500 MG tablet Take 1 tablet by  mouth 2 (two) times daily before a meal.   Yes [provider]  hydrALAZINE (APRESOLINE) 50 MG tablet Take 1 tablet (50 mg total) by mouth 3 (three) times daily. 03/09/19  Yes Leroy Sea, MD  lisinopril (PRINIVIL,ZESTRIL) 20 MG tablet Take 20 mg by mouth daily.   Yes [provider]  phenytoin (DILANTIN) 300 MG ER capsule Take 1 capsule (300 mg total) by mouth at bedtime. 03/09/19  Yes Leroy Sea, MD  solifenacin (VESICARE) 5 MG tablet Take 5 mg by mouth daily. 06/05/19  Yes [provider]  labetalol (NORMODYNE) 100 MG tablet Take 0.5 tablets (50 mg total) by mouth 2 (two) times daily. Patient not taking: Reported on 08/29/2019 03/09/19   Leroy Sea, MD  tamsulosin (FLOMAX) 0.4 MG CAPS capsule Take 0.4 mg by mouth daily as needed. 06/05/19   [provider]    Family History History reviewed. No pertinent family history.  Social History Social History   Tobacco Use   Smoking status: Current Every Day Smoker   Smokeless tobacco: Never Used  Substance Use Topics   Alcohol use: No   Drug use: No     Allergies   Patient has no known allergies.   Review of Systems Review of Systems  Neurological: Positive for seizures.  All other systems reviewed and are negative.    Physical Exam Updated Vital Signs BP (!) 157/90    Pulse (!) 59  Temp 97.7 F (36.5 C) (Oral)    Resp 18    Ht 6' (1.829 m)    Wt 94 kg    SpO2 99%    BMI 28.11 kg/m   Physical Exam Vitals signs and nursing note reviewed.  Constitutional:      Comments: Chronically ill, confused   HENT:     Head: Normocephalic.     Comments: No obvious scalp hematoma     Mouth/Throat:     Mouth: Mucous membranes are moist.  Eyes:     Extraocular Movements: Extraocular movements intact.     Pupils: Pupils are equal, round, and reactive to light.  Neck:     Comments: C collar in place  Cardiovascular:     Rate and Rhythm: Normal rate and regular rhythm.     Pulses:  Normal pulses.     Heart sounds: Normal heart sounds.  Pulmonary:     Effort: Pulmonary effort is normal.     Breath sounds: Normal breath sounds.  Abdominal:     General: Abdomen is flat.     Palpations: Abdomen is soft.  Musculoskeletal: Normal range of motion.  Skin:    General: Skin is warm.     Capillary Refill: Capillary refill takes less than 2 seconds.  Neurological:     General: No focal deficit present.     Comments: No eye deviation. A & O x 2. Moving all extremities. No seizure like activity confused   Psychiatric:        Mood and Affect: Mood normal.        Behavior: Behavior normal.      ED Treatments / Results  Labs (all labs ordered are listed, but only abnormal results are displayed) Labs Reviewed  CBC WITH DIFFERENTIAL/PLATELET - Abnormal; Notable for the following components:      Result Value   MCV 100.9 (*)    All other components within normal limits  COMPREHENSIVE METABOLIC PANEL - Abnormal; Notable for the following components:   Total Protein 8.3 (*)    All other components within normal limits  PHENYTOIN LEVEL, TOTAL - Abnormal; Notable for the following components:   Phenytoin Lvl <2.5 (*)    All other components within normal limits  URINALYSIS, ROUTINE W REFLEX MICROSCOPIC - Abnormal; Notable for the following components:   Protein, ur 100 (*)    All other components within normal limits  TROPONIN I (HIGH SENSITIVITY) - Abnormal; Notable for the following components:   Troponin I (High Sensitivity) 18 (*)    All other components within normal limits  SARS CORONAVIRUS 2 (TAT 6-24 HRS)  TROPONIN I (HIGH SENSITIVITY)    EKG None  Radiology Ct Head Wo Contrast  Result Date: 08/29/2019 CLINICAL DATA:  Ground level fall, seizure EXAM: CT HEAD WITHOUT CONTRAST CT CERVICAL SPINE WITHOUT CONTRAST TECHNIQUE: Multidetector CT imaging of the head and cervical spine was performed following the standard protocol without intravenous contrast.  Multiplanar CT image reconstructions of the cervical spine were also generated. COMPARISON:  03/07/2019 FINDINGS: CT HEAD FINDINGS Brain: No evidence of acute infarction, hemorrhage, hydrocephalus, extra-axial collection or mass lesion/mass effect. Multiple remote prior bilateral basal ganglia infarcts. Extensive low-density changes within the periventricular and subcortical white matter compatible with chronic microvascular ischemic change. Moderate diffuse cerebral volume loss. Vascular: Mild atherosclerotic calcifications involving the large vessels of the skull base. No unexpected hyperdense vessel. Skull: Normal. Negative for fracture or focal lesion. Sinuses/Orbits: No acute finding. Other: None. CT CERVICAL SPINE FINDINGS  Alignment: Straightening of the cervical lordosis. No facet dislocation. Skull base and vertebrae: No acute fracture. No primary bone lesion or focal pathologic process. Soft tissues and spinal canal: No prevertebral fluid or swelling. No visible canal hematoma. Disc levels: Intervertebral disc height loss with prominent anterior endplate osteophytosis at C4-5 through C6-7. Longitudinal calcification posterior to the C4, C5, and C6 vertebral bodies possibly representing ossification of the posterior longitudinal ligament. This prominent calcification results in canal narrowing at these levels. Multilevel facet and uncovertebral arthropathy narrow the neural foramina, most pronounced at the C5-6 level bilaterally. Upper chest: Lung apices clear.  No acute findings. Other: Carotid artery calcifications. IMPRESSION: 1. No acute intracranial findings. 2. No acute fracture or dislocation of the cervical spine. 3. Chronic microvascular ischemic disease and cerebral volume loss. 4. Multilevel degenerative changes of the cervical spine, most pronounced at C5-6. Electronically Signed   By: Duanne GuessNicholas  Plundo M.D.   On: 08/29/2019 14:49   Ct Cervical Spine Wo Contrast  Result Date:  08/29/2019 CLINICAL DATA:  Ground level fall, seizure EXAM: CT HEAD WITHOUT CONTRAST CT CERVICAL SPINE WITHOUT CONTRAST TECHNIQUE: Multidetector CT imaging of the head and cervical spine was performed following the standard protocol without intravenous contrast. Multiplanar CT image reconstructions of the cervical spine were also generated. COMPARISON:  03/07/2019 FINDINGS: CT HEAD FINDINGS Brain: No evidence of acute infarction, hemorrhage, hydrocephalus, extra-axial collection or mass lesion/mass effect. Multiple remote prior bilateral basal ganglia infarcts. Extensive low-density changes within the periventricular and subcortical white matter compatible with chronic microvascular ischemic change. Moderate diffuse cerebral volume loss. Vascular: Mild atherosclerotic calcifications involving the large vessels of the skull base. No unexpected hyperdense vessel. Skull: Normal. Negative for fracture or focal lesion. Sinuses/Orbits: No acute finding. Other: None. CT CERVICAL SPINE FINDINGS Alignment: Straightening of the cervical lordosis. No facet dislocation. Skull base and vertebrae: No acute fracture. No primary bone lesion or focal pathologic process. Soft tissues and spinal canal: No prevertebral fluid or swelling. No visible canal hematoma. Disc levels: Intervertebral disc height loss with prominent anterior endplate osteophytosis at C4-5 through C6-7. Longitudinal calcification posterior to the C4, C5, and C6 vertebral bodies possibly representing ossification of the posterior longitudinal ligament. This prominent calcification results in canal narrowing at these levels. Multilevel facet and uncovertebral arthropathy narrow the neural foramina, most pronounced at the C5-6 level bilaterally. Upper chest: Lung apices clear.  No acute findings. Other: Carotid artery calcifications. IMPRESSION: 1. No acute intracranial findings. 2. No acute fracture or dislocation of the cervical spine. 3. Chronic microvascular  ischemic disease and cerebral volume loss. 4. Multilevel degenerative changes of the cervical spine, most pronounced at C5-6. Electronically Signed   By: Duanne GuessNicholas  Plundo M.D.   On: 08/29/2019 14:49   Dg Chest Port 1 View  Result Date: 08/29/2019 CLINICAL DATA:  Unwitnessed seizure, hypertension, diabetes mellitus EXAM: PORTABLE CHEST 1 VIEW COMPARISON:  Portable exam 1339 hours compared to 03/07/2019 FINDINGS: Enlargement of cardiac silhouette. Mediastinal contours and pulmonary vascularity normal. Atherosclerotic calcification aorta. Lungs grossly clear. No pulmonary infiltrate, pleural effusion or pneumothorax. Bones demineralized. IMPRESSION: Enlargement of cardiac silhouette. No acute abnormalities. Electronically Signed   By: Ulyses SouthwardMark  Boles M.D.   On: 08/29/2019 14:19    Procedures Procedures (including critical care time)  Medications Ordered in ED Medications  sodium chloride 0.9 % bolus 1,000 mL (0 mLs Intravenous Stopped 08/29/19 1458)  phenytoin (DILANTIN) injection 300 mg (300 mg Intravenous Given 08/29/19 1551)     Initial Impression / Assessment and Plan /  ED Course  I have reviewed the triage vital signs and the nursing notes.  Pertinent labs & imaging results that were available during my care of the patient were reviewed by me and considered in my medical decision making (see chart for details).       Philip Robinson is a 68 y.o. male here with seizure like activity, confusion. Likely post ictal state. Has hx of seizure on dilantin and has hx of medication uncompliance. Will check dilantin level. Will get labs, UA, CXR , CT head/neck.   5:46 PM Dilantin level < 2.5. I talked to daughter who states that she sets out his meds but he often missed doses. Loaded with dilantin 300 mg IV (home dose). Mental status back to baseline. Initial trop 18 down to 15 with the delta. UA, CXR clear. Stable for discharge. Told daughter to make sure he takes his meds as prescribed   Final  Clinical Impressions(s) / ED Diagnoses   Final diagnoses:  None    ED Discharge Orders    None       Drenda Freeze, MD 08/29/19 1747

## 2019-08-29 NOTE — ED Notes (Signed)
Assessment of patient revealed IV in right AC was removed from arm and sitting on bed. Pressure applied to site. Catheter intact.

## 2019-12-10 ENCOUNTER — Other Ambulatory Visit: Payer: Self-pay

## 2019-12-10 ENCOUNTER — Ambulatory Visit (INDEPENDENT_AMBULATORY_CARE_PROVIDER_SITE_OTHER): Payer: Medicare Other | Admitting: Podiatry

## 2019-12-10 ENCOUNTER — Encounter: Payer: Self-pay | Admitting: Podiatry

## 2019-12-10 DIAGNOSIS — E119 Type 2 diabetes mellitus without complications: Secondary | ICD-10-CM

## 2019-12-10 DIAGNOSIS — M79675 Pain in left toe(s): Secondary | ICD-10-CM | POA: Diagnosis not present

## 2019-12-10 DIAGNOSIS — B351 Tinea unguium: Secondary | ICD-10-CM | POA: Insufficient documentation

## 2019-12-10 DIAGNOSIS — M79674 Pain in right toe(s): Secondary | ICD-10-CM

## 2019-12-11 NOTE — Progress Notes (Signed)
This patient presents to the office with chief complaint of long thick nails and diabetic feet.  This patient  says there  is  no pain and discomfort in his  feet.  This patient says there are long thick painful nails.  These nails are painful walking and wearing shoes.  Patient has no history of infection or drainage from both feet.  Patient is unable to  self treat his own nails . This patient presents  to the office today for treatment of the  long nails and a foot evaluation due to history of  Diabetes. Patient has history of CVA.  He presents to the office with a male caregiver.  General Appearance  Alert, conversant and in no acute stress.  Vascular  Dorsalis pedis and posterior tibial  pulses are palpable  bilaterally.  Capillary return is within normal limits  bilaterally. Temperature is within normal limits  bilaterally.  Neurologic  Senn-Weinstein monofilament wire test within normal limits  bilaterally. Muscle power within normal limits bilaterally.  Nails Thick disfigured discolored nails with subungual debris  from hallux to fifth toes bilaterally. No evidence of bacterial infection or drainage bilaterally.  Orthopedic  No limitations of motion of motion feet .  No crepitus or effusions noted.  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  Skin  normotropic skin with no porokeratosis noted bilaterally.  No signs of infections or ulcers noted.     Onychomycosis  Diabetes with no foot complications  IE  Debride nails x 10.  A diabetic foot exam was performed and there is no evidence of any vascular or neurologic pathology.   RTC 10 weeks.   Helane Gunther DPM

## 2020-03-10 ENCOUNTER — Other Ambulatory Visit: Payer: Self-pay

## 2020-03-10 ENCOUNTER — Ambulatory Visit (INDEPENDENT_AMBULATORY_CARE_PROVIDER_SITE_OTHER): Payer: Medicare Other | Admitting: Podiatry

## 2020-03-10 ENCOUNTER — Encounter: Payer: Self-pay | Admitting: Podiatry

## 2020-03-10 VITALS — Temp 97.5°F

## 2020-03-10 DIAGNOSIS — B351 Tinea unguium: Secondary | ICD-10-CM

## 2020-03-10 DIAGNOSIS — M79674 Pain in right toe(s): Secondary | ICD-10-CM

## 2020-03-10 DIAGNOSIS — E119 Type 2 diabetes mellitus without complications: Secondary | ICD-10-CM | POA: Diagnosis not present

## 2020-03-10 DIAGNOSIS — M79675 Pain in left toe(s): Secondary | ICD-10-CM

## 2020-03-10 NOTE — Progress Notes (Signed)
This patient returns to my office for at risk foot care.  This patient requires this care by a professional since this patient will be at risk due to having diabetes.  Patient also has amputation fifth toe right foot.  This patient is unable to cut nails himself since the patient cannot reach his nails.These nails are painful walking and wearing shoes.  This patient presents for at risk foot care today. Patient presents to the office with a male caregiver.  General Appearance  Alert, conversant and in no acute stress.  Vascular  Dorsalis pedis and posterior tibial  pulses are palpable  bilaterally.  Capillary return is within normal limits  bilaterally. Temperature is within normal limits  bilaterally.  Neurologic  Senn-Weinstein monofilament wire test within normal limits  bilaterally. Muscle power within normal limits bilaterally.  Nails Thick disfigured discolored nails with subungual debris  from hallux to fifth toes bilaterally. No evidence of bacterial infection or drainage bilaterally.  Orthopedic  No limitations of motion  feet .  No crepitus or effusions noted.  No bony pathology or digital deformities noted.  Skin  normotropic skin with no porokeratosis noted bilaterally.  No signs of infections or ulcers noted.     Onychomycosis  Pain in right toes  Pain in left toes  Consent was obtained for treatment procedures.   Mechanical debridement of nails 1-5  bilaterally performed with a nail nipper.  Filed with dremel without incident.    Return office visit  10 weeks                    Told patient to return for periodic foot care and evaluation due to potential at risk complications.   Helane Gunther DPM

## 2020-05-19 ENCOUNTER — Ambulatory Visit: Payer: Medicare Other | Admitting: Podiatry

## 2020-07-28 ENCOUNTER — Ambulatory Visit: Payer: Medicare Other | Admitting: Podiatry

## 2020-07-29 ENCOUNTER — Other Ambulatory Visit: Payer: Self-pay

## 2020-07-29 ENCOUNTER — Emergency Department (HOSPITAL_COMMUNITY)
Admission: EM | Admit: 2020-07-29 | Discharge: 2020-07-29 | Disposition: A | Discharge status: Home / Self Care | Payer: Medicare Other | Attending: Emergency Medicine | Admitting: Emergency Medicine

## 2020-07-29 DIAGNOSIS — I10 Essential (primary) hypertension: Secondary | ICD-10-CM | POA: Diagnosis not present

## 2020-07-29 DIAGNOSIS — E119 Type 2 diabetes mellitus without complications: Secondary | ICD-10-CM | POA: Insufficient documentation

## 2020-07-29 DIAGNOSIS — W010XXA Fall on same level from slipping, tripping and stumbling without subsequent striking against object, initial encounter: Secondary | ICD-10-CM | POA: Insufficient documentation

## 2020-07-29 DIAGNOSIS — Z7982 Long term (current) use of aspirin: Secondary | ICD-10-CM | POA: Diagnosis not present

## 2020-07-29 DIAGNOSIS — R410 Disorientation, unspecified: Secondary | ICD-10-CM | POA: Diagnosis present

## 2020-07-29 DIAGNOSIS — F17200 Nicotine dependence, unspecified, uncomplicated: Secondary | ICD-10-CM | POA: Diagnosis not present

## 2020-07-29 DIAGNOSIS — F0150 Vascular dementia without behavioral disturbance: Secondary | ICD-10-CM | POA: Insufficient documentation

## 2020-07-29 DIAGNOSIS — W19XXXA Unspecified fall, initial encounter: Secondary | ICD-10-CM

## 2020-07-29 DIAGNOSIS — Z7984 Long term (current) use of oral hypoglycemic drugs: Secondary | ICD-10-CM | POA: Insufficient documentation

## 2020-07-29 DIAGNOSIS — Z79899 Other long term (current) drug therapy: Secondary | ICD-10-CM | POA: Insufficient documentation

## 2020-07-29 LAB — CBC WITH DIFFERENTIAL/PLATELET
Abs Immature Granulocytes: 0.01 10*3/uL (ref 0.00–0.07)
Basophils Absolute: 0 10*3/uL (ref 0.0–0.1)
Basophils Relative: 1 %
Eosinophils Absolute: 0.1 10*3/uL (ref 0.0–0.5)
Eosinophils Relative: 3 %
HCT: 39.4 % (ref 39.0–52.0)
Hemoglobin: 13.1 g/dL (ref 13.0–17.0)
Immature Granulocytes: 0 %
Lymphocytes Relative: 20 %
Lymphs Abs: 0.7 10*3/uL (ref 0.7–4.0)
MCH: 33.7 pg (ref 26.0–34.0)
MCHC: 33.2 g/dL (ref 30.0–36.0)
MCV: 101.3 fL — ABNORMAL HIGH (ref 80.0–100.0)
Monocytes Absolute: 0.5 10*3/uL (ref 0.1–1.0)
Monocytes Relative: 15 %
Neutro Abs: 2.2 10*3/uL (ref 1.7–7.7)
Neutrophils Relative %: 61 %
Platelets: 180 10*3/uL (ref 150–400)
RBC: 3.89 MIL/uL — ABNORMAL LOW (ref 4.22–5.81)
RDW: 12.4 % (ref 11.5–15.5)
WBC: 3.6 10*3/uL — ABNORMAL LOW (ref 4.0–10.5)
nRBC: 0 % (ref 0.0–0.2)

## 2020-07-29 LAB — COMPREHENSIVE METABOLIC PANEL
ALT: 11 U/L (ref 0–44)
AST: 14 U/L — ABNORMAL LOW (ref 15–41)
Albumin: 3.8 g/dL (ref 3.5–5.0)
Alkaline Phosphatase: 85 U/L (ref 38–126)
Anion gap: 6 (ref 5–15)
BUN: 19 mg/dL (ref 8–23)
CO2: 25 mmol/L (ref 22–32)
Calcium: 8.8 mg/dL — ABNORMAL LOW (ref 8.9–10.3)
Chloride: 109 mmol/L (ref 98–111)
Creatinine, Ser: 0.95 mg/dL (ref 0.61–1.24)
GFR calc Af Amer: >60 mL/min (ref >60)
GFR calc non Af Amer: >60 mL/min (ref >60)
Glucose, Bld: 159 mg/dL — ABNORMAL HIGH (ref 70–99)
Potassium: 3.8 mmol/L (ref 3.5–5.1)
Sodium: 140 mmol/L (ref 135–145)
Total Bilirubin: 0.6 mg/dL (ref 0.3–1.2)
Total Protein: 7.1 g/dL (ref 6.5–8.1)

## 2020-07-29 LAB — TROPONIN I (HIGH SENSITIVITY): Troponin I (High Sensitivity): 10 ng/L (ref <18)

## 2020-07-29 LAB — PHENYTOIN LEVEL, TOTAL: Phenytoin Lvl: 7.7 ug/mL — ABNORMAL LOW (ref 10.0–20.0)

## 2020-07-29 NOTE — ED Triage Notes (Signed)
Fell in kitchen unwitnessed No complaints from patient Family wants him evaluated No blood thinners

## 2020-07-29 NOTE — Discharge Instructions (Addendum)
Follow-up with your doctor as needed.  Return here for any problems °

## 2020-07-29 NOTE — ED Provider Notes (Signed)
[facility]-EMERGENCY DEPT Provider Note   CSN: [id number] Arrival date & time: 07/29/20  1455     History Chief Complaint  Patient presents with  . Fall    Ahmet Schank is a 69 y.o. male.  69 year old male presents here after unwitnessed fall at home.  Patient has a history of dementia and is unsure of what happened.  He currently has no complaints at this time.  No visible signs of trauma.  According to EMS, patient did not will be transported here.  No reported recent history of illnesses        Past Medical History:  Diagnosis Date  . Diabetes mellitus without complication (HCC)   . Hypertension     Patient Active Problem List   Diagnosis Date Noted  . Pain due to onychomycosis of toenails of both feet 12/10/2019  . Diabetes mellitus without complication (HCC) 12/10/2019  . Aortic dissection (HCC) 03/07/2019  . Ascending aortic aneurysm (HCC) 03/07/2019  . 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 Vascular dementia (HCC) 03/07/2019  . Nicotine abuse 03/07/2019  . CVA (cerebral vascular accident) (HCC) 03/07/2019  . Adjustment disorder with mixed anxiety and depressed mood 05/27/2015  . Essential hypertension, malignant   . Hypokalemia   . Seizure (HCC) 05/25/2015  . Acute encephalopathy 05/25/2015  . Delirium     No past surgical history on file.     No family history on file.  Social History   Tobacco Use  . Smoking status: Current Every Day Smoker  . Smokeless tobacco: Never Used  Substance Use Topics  . Alcohol use: No  . Drug use: No    Home Medications Prior to Admission medications   Medication Sig Start Date End Date Taking? Authorizing Provider  aspirin 81 MG chewable tablet Chew 81 mg by mouth daily.    [provider]  atorvastatin (LIPITOR) 40 MG tablet TAKE 1 TABLET(40 MG) BY MOUTH DAILY 03/08/20   [provider]  cyanocobalamin (,VITAMIN B-12,) 1000 MCG/ML injection Inject into the muscle.  05/15/19   [provider]  glipiZIDE-metformin (METAGLIP) 2.5-500 MG tablet Take 1 tablet by mouth 2 (two) times daily before a meal.    [provider]  hydrALAZINE (APRESOLINE) 25 MG tablet  01/05/20   [provider]  hydrALAZINE (APRESOLINE) 50 MG tablet Take 1 tablet (50 mg total) by mouth 3 (three) times daily. 03/09/19   Leroy Sea, MD  labetalol (NORMODYNE) 100 MG tablet Take 0.5 tablets (50 mg total) by mouth 2 (two) times daily. 03/09/19   Leroy Sea, MD  lisinopril (ZESTRIL) 20 MG tablet  02/29/20   [provider]  mirabegron ER (MYRBETRIQ) 50 MG TB24 tablet Take by mouth. 03/09/20   [provider]  oxybutynin (DITROPAN-XL) 10 MG 24 hr tablet  12/07/19   [provider]  phenytoin (DILANTIN) 300 MG ER capsule Take 1 capsule (300 mg total) by mouth at bedtime. 03/09/19   Leroy Sea, MD  PHENYTOIN INFATABS 50 MG tablet  01/15/20   [provider]  sertraline (ZOLOFT) 100 MG tablet  02/12/20   [provider]  solifenacin (VESICARE) 5 MG tablet Take 5 mg by mouth daily. 06/05/19   [provider]  tamsulosin (FLOMAX) 0.4 MG CAPS capsule Take 0.4 mg by mouth daily as needed. 06/05/19   [provider]  UNABLE TO FIND 1 each by Misc.(Non-Drug; Combo Route) route 2 times daily. 05/14/19   [provider]  Allergies    Patient has no known allergies.  Review of Systems   Review of Systems  Unable to perform ROS: Dementia    Physical Exam Updated Vital Signs BP 117/69 (BP Location: Left Arm)   Pulse 60   Temp 98.8 F (37.1 C) (Oral)   Resp 17   SpO2 100%   Physical Exam Vitals and nursing note reviewed.  Constitutional:      General: He is not in acute distress.    Appearance: Normal appearance. He is well-developed. He is not toxic-appearing.  HENT:     Head: Normocephalic and atraumatic.  Eyes:     General: Lids are normal.     Conjunctiva/sclera: Conjunctivae normal.      Pupils: Pupils are equal, round, and reactive to light.  Neck:     Thyroid: No thyroid mass.     Trachea: No tracheal deviation.  Cardiovascular:     Rate and Rhythm: Normal rate and regular rhythm.     Heart sounds: Normal heart sounds. No murmur heard.  No gallop.   Pulmonary:     Effort: Pulmonary effort is normal. No respiratory distress.     Breath sounds: Normal breath sounds. No stridor. No decreased breath sounds, wheezing, rhonchi or rales.  Abdominal:     General: Bowel sounds are normal. There is no distension.     Palpations: Abdomen is soft.     Tenderness: There is no abdominal tenderness. There is no rebound.  Musculoskeletal:        General: No tenderness. Normal range of motion.     Cervical back: Normal range of motion and neck supple.  Skin:    General: Skin is warm and dry.     Findings: No abrasion or rash.  Neurological:     General: No focal deficit present.     Mental Status: He is alert. He is disoriented.     GCS: GCS eye subscore is 4. GCS verbal subscore is 5. GCS motor subscore is 6.     Cranial Nerves: No cranial nerve deficit.     Sensory: No sensory deficit.     Motor: Motor function is intact.     Comments: Strength is 5 out of 5 in upper as well as lower extremities  Psychiatric:        Attention and Perception: Attention normal.        Mood and Affect: Affect is blunt.     ED Results / Procedures / Treatments   Labs (all labs ordered are listed, but only abnormal results are displayed) Labs Reviewed  PHENYTOIN LEVEL, TOTAL  CBC WITH DIFFERENTIAL/PLATELET  COMPREHENSIVE METABOLIC PANEL    EKG EKG Interpretation  Date/Time:  Thursday July 29 2020 16:19:21 EDT Ventricular Rate:  54 PR Interval:  228 QRS Duration: 90 QT Interval:  448 QTC Calculation: 424 R Axis:   35 Text Interpretation: Sinus bradycardia with 1st degree A-V block Left ventricular hypertrophy with repolarization abnormality ( Sokolow-Lyon , Cornell product ,  Romhilt-Estes ) Cannot rule out Septal infarct , age undetermined Abnormal ECG No significant change since last tracing Confirmed by Lorre Nick ([id number]) on 07/29/2020 5:29:53 PM   Radiology No results found.  Procedures Procedures (including critical care time)  Medications Ordered in ED Medications - No data to display  ED Course  I have reviewed the triage vital signs and the nursing notes.  Pertinent labs & imaging results that were available during my care of the patient were reviewed by me  and considered in my medical decision making (see chart for details).    MDM Rules/Calculators/A&P                          Patient's work appears reassuring.  EKG without new changes.  Troponin negative.  Dilantin is not toxic.  He is not orthostatic.  Discussed with patient's daughter and will send back home with return precautions Final Clinical Impression(s) / ED Diagnoses Final diagnoses:  None    Rx / DC Orders ED Discharge Orders    None       Lorre Nick, MD 07/29/20 1904

## 2020-08-16 ENCOUNTER — Encounter (HOSPITAL_COMMUNITY): Payer: Self-pay

## 2020-08-16 ENCOUNTER — Emergency Department (HOSPITAL_COMMUNITY)
Admission: EM | Admit: 2020-08-16 | Discharge: 2020-08-17 | Disposition: A | Discharge status: Home / Self Care | Payer: Medicare Other | Attending: Emergency Medicine | Admitting: Emergency Medicine

## 2020-08-16 DIAGNOSIS — Z5321 Procedure and treatment not carried out due to patient leaving prior to being seen by health care provider: Secondary | ICD-10-CM | POA: Insufficient documentation

## 2020-08-16 DIAGNOSIS — R569 Unspecified convulsions: Secondary | ICD-10-CM | POA: Diagnosis not present

## 2020-08-16 NOTE — ED Notes (Signed)
Daughter Rinaldo Cloud would like an update [phone]

## 2020-08-16 NOTE — ED Triage Notes (Signed)
Pt arrives to ED via [facility] w/ c/o seizures. Family reports pt typically has tonic clonic but today was more absent seizure. Pt arrives to ED postictal.

## 2020-08-17 NOTE — ED Notes (Signed)
Pt was encouraged to stay. Pt said he could no longer. Pt IV was took out.

## 2020-09-27 DIAGNOSIS — Z7409 Other reduced mobility: Secondary | ICD-10-CM | POA: Insufficient documentation

## 2020-09-27 DIAGNOSIS — R269 Unspecified abnormalities of gait and mobility: Secondary | ICD-10-CM | POA: Insufficient documentation

## 2020-10-21 ENCOUNTER — Encounter (HOSPITAL_COMMUNITY): Payer: Self-pay

## 2020-10-21 ENCOUNTER — Other Ambulatory Visit: Payer: Self-pay

## 2020-10-21 ENCOUNTER — Emergency Department (HOSPITAL_COMMUNITY): Payer: Medicare Other

## 2020-10-21 ENCOUNTER — Observation Stay (HOSPITAL_COMMUNITY)
Admission: EM | Admit: 2020-10-21 | Discharge: 2020-10-22 | Disposition: A | Discharge status: Home / Self Care | Payer: Medicare Other | Attending: Internal Medicine | Admitting: Internal Medicine

## 2020-10-21 DIAGNOSIS — F17200 Nicotine dependence, unspecified, uncomplicated: Secondary | ICD-10-CM | POA: Diagnosis not present

## 2020-10-21 DIAGNOSIS — E119 Type 2 diabetes mellitus without complications: Secondary | ICD-10-CM | POA: Insufficient documentation

## 2020-10-21 DIAGNOSIS — R9431 Abnormal electrocardiogram [ECG] [EKG]: Secondary | ICD-10-CM

## 2020-10-21 DIAGNOSIS — F0150 Vascular dementia without behavioral disturbance: Secondary | ICD-10-CM | POA: Insufficient documentation

## 2020-10-21 DIAGNOSIS — I10 Essential (primary) hypertension: Secondary | ICD-10-CM | POA: Diagnosis not present

## 2020-10-21 DIAGNOSIS — J810 Acute pulmonary edema: Secondary | ICD-10-CM | POA: Diagnosis not present

## 2020-10-21 DIAGNOSIS — Z20822 Contact with and (suspected) exposure to covid-19: Secondary | ICD-10-CM | POA: Insufficient documentation

## 2020-10-21 DIAGNOSIS — Z79899 Other long term (current) drug therapy: Secondary | ICD-10-CM | POA: Insufficient documentation

## 2020-10-21 DIAGNOSIS — R4182 Altered mental status, unspecified: Secondary | ICD-10-CM | POA: Diagnosis not present

## 2020-10-21 DIAGNOSIS — R569 Unspecified convulsions: Secondary | ICD-10-CM

## 2020-10-21 DIAGNOSIS — G9340 Encephalopathy, unspecified: Secondary | ICD-10-CM | POA: Diagnosis present

## 2020-10-21 LAB — URINALYSIS, ROUTINE W REFLEX MICROSCOPIC
Bacteria, UA: NONE SEEN
Bilirubin Urine: NEGATIVE
Glucose, UA: NEGATIVE mg/dL
Hgb urine dipstick: NEGATIVE
Ketones, ur: NEGATIVE mg/dL
Leukocytes,Ua: NEGATIVE
Nitrite: NEGATIVE
Protein, ur: 30 mg/dL — AB
Specific Gravity, Urine: 1.02 (ref 1.005–1.030)
pH: 6 (ref 5.0–8.0)

## 2020-10-21 LAB — CBG MONITORING, ED: Glucose-Capillary: 150 mg/dL — ABNORMAL HIGH (ref 70–99)

## 2020-10-21 NOTE — ED Notes (Signed)
Per pt. He was tired and weak and wanted to lay down . Pt stated he knew he needed help so [facility] and [facility] help. Ems stated that when they got to the pt. He glucose was in the 50's. Pt is AO x 2 pt is able to answer simple questions and follow commands. Police was able to get in contact with pt. Daughter

## 2020-10-21 NOTE — ED Notes (Signed)
Pt. In xray 

## 2020-10-22 ENCOUNTER — Encounter (HOSPITAL_COMMUNITY): Payer: Self-pay | Admitting: Emergency Medicine

## 2020-10-22 DIAGNOSIS — G9340 Encephalopathy, unspecified: Secondary | ICD-10-CM

## 2020-10-22 LAB — BRAIN NATRIURETIC PEPTIDE: B Natriuretic Peptide: 101.4 pg/mL — ABNORMAL HIGH (ref 0.0–100.0)

## 2020-10-22 LAB — CBC WITH DIFFERENTIAL/PLATELET
Abs Immature Granulocytes: 0.04 10*3/uL (ref 0.00–0.07)
Basophils Absolute: 0 10*3/uL (ref 0.0–0.1)
Basophils Relative: 1 %
Eosinophils Absolute: 0.1 10*3/uL (ref 0.0–0.5)
Eosinophils Relative: 2 %
HCT: 42.7 % (ref 39.0–52.0)
Hemoglobin: 13.9 g/dL (ref 13.0–17.0)
Immature Granulocytes: 1 %
Lymphocytes Relative: 27 %
Lymphs Abs: 1.2 10*3/uL (ref 0.7–4.0)
MCH: 33.1 pg (ref 26.0–34.0)
MCHC: 32.6 g/dL (ref 30.0–36.0)
MCV: 101.7 fL — ABNORMAL HIGH (ref 80.0–100.0)
Monocytes Absolute: 0.5 10*3/uL (ref 0.1–1.0)
Monocytes Relative: 11 %
Neutro Abs: 2.6 10*3/uL (ref 1.7–7.7)
Neutrophils Relative %: 58 %
Platelets: 196 10*3/uL (ref 150–400)
RBC: 4.2 MIL/uL — ABNORMAL LOW (ref 4.22–5.81)
RDW: 12.4 % (ref 11.5–15.5)
WBC: 4.4 10*3/uL (ref 4.0–10.5)
nRBC: 0 % (ref 0.0–0.2)

## 2020-10-22 LAB — I-STAT CHEM 8, ED
BUN: 20 mg/dL (ref 8–23)
Calcium, Ion: 1.17 mmol/L (ref 1.15–1.40)
Chloride: 104 mmol/L (ref 98–111)
Creatinine, Ser: 0.8 mg/dL (ref 0.61–1.24)
Glucose, Bld: 107 mg/dL — ABNORMAL HIGH (ref 70–99)
HCT: 43 % (ref 39.0–52.0)
Hemoglobin: 14.6 g/dL (ref 13.0–17.0)
Potassium: 4.5 mmol/L (ref 3.5–5.1)
Sodium: 141 mmol/L (ref 135–145)
TCO2: 27 mmol/L (ref 22–32)

## 2020-10-22 LAB — RAPID URINE DRUG SCREEN, HOSP PERFORMED
Amphetamines: NOT DETECTED
Barbiturates: NOT DETECTED
Benzodiazepines: NOT DETECTED
Cocaine: NOT DETECTED
Opiates: NOT DETECTED
Tetrahydrocannabinol: NOT DETECTED

## 2020-10-22 LAB — RESP PANEL BY RT-PCR (FLU A&B, COVID) ARPGX2
Influenza A by PCR: NEGATIVE
Influenza B by PCR: NEGATIVE
SARS Coronavirus 2 by RT PCR: NEGATIVE

## 2020-10-22 LAB — CBG MONITORING, ED: Glucose-Capillary: 88 mg/dL (ref 70–99)

## 2020-10-22 LAB — PHENYTOIN LEVEL, TOTAL: Phenytoin Lvl: 7.3 ug/mL — ABNORMAL LOW (ref 10.0–20.0)

## 2020-10-22 MED ORDER — PHENYTOIN SODIUM EXTENDED 100 MG PO CAPS: 300.0000 mg | ORAL_CAPSULE | Freq: Every day | ORAL | Status: DC

## 2020-10-22 MED ORDER — ENOXAPARIN SODIUM 40 MG/0.4ML ~~LOC~~ SOLN: 40.0000 mg | SUBCUTANEOUS | Status: DC

## 2020-10-22 MED ORDER — FUROSEMIDE 10 MG/ML IJ SOLN
40.0000 mg | Freq: Once | INTRAMUSCULAR | Status: AC
  Administered 2020-10-22: 40 mg via INTRAVENOUS
  Filled 2020-10-22: qty 4

## 2020-10-22 MED ORDER — LABETALOL HCL 5 MG/ML IV SOLN: 10.0000 mg | INTRAVENOUS | Status: DC | PRN

## 2020-10-22 MED ORDER — SODIUM CHLORIDE 0.9 % IV SOLN
1500.0000 mg | Freq: Once | INTRAVENOUS | Status: DC
  Filled 2020-10-22: qty 30

## 2020-10-22 NOTE — ED Notes (Signed)
Patient stated he wanted to go home. Patient was told he had a bed ready upstairs. Patient stated he "was tired of laying in the bed and wasn't staying in this "fucking " place any longer. Patient took himself off the monitor and BP cuff and started to get dressed. MD was messaged on [employer] and through [employer]. Charge RN has been notified and has spoke with patient's daughter. Daughter stated she was on her way here to ED.

## 2020-10-22 NOTE — ED Notes (Signed)
MD wants to be notified when daughter arrives

## 2020-10-22 NOTE — H&P (Signed)
History and Physical    Philip Robinson DDU:[id number] DOB: [birth year] DOA: 10/21/2020  PCP: Hermelinda Dellen, NP   Patient coming from: Home.  Most of the history was obtained from the ER physician.  Unable to reach family.  Patient has history of dementia.    Chief Complaint: Confusion.  HPI: Philip Robinson is a 69 y.o. male with history of dementia, seizures, type B thoracic aortic aneurysm, hypertension previous history of diabetes not sure if patient still on medication was found to be confused after patient went to his neighbor's house asking for help.  Patient's neighbor gave some juice concerning patient may be hypoglycemic and call EMS.  EMS as per the report found his blood sugar was around 50.  And patient was brought to the ER.  Patient did not complain of any chest pain or shortness of breath or headache or nausea vomiting or diarrhea.  ED Course: In the ER patient appeared nonfocal alert awake oriented to his name and place CT head was unremarkable.  EKG showed sinus bradycardia with heart rate around 54 bpm with the T wave inversion in the inferolateral leads.  Some of the T wave inversions were seen in the old EKG.  Chest x-ray was showing some fluid overload like features.  And patient's phenytoin level was 1.3 which was subtherapeutic.  Given that altered mental status among the differentials was possible postictal state for which ER physician ordered Dilantin loading dose and given that patient had x-ray showing possible fluid overload 40 mg IV Lasix was ordered.  Patient admitted for further observation.  Labs are largely unremarkable except CBC showing macrocytic picture.  Covid test was negative.  Review of Systems: As per HPI, rest all negative.   Past Medical History:  Diagnosis Date   Diabetes mellitus without complication (HCC)    Hypertension     History reviewed. No pertinent surgical history.   reports that he has been smoking. He has never used smokeless  tobacco. He reports that he does not drink alcohol and does not use drugs.  No Known Allergies  Family History  Family history unknown: Yes    Prior to Admission medications   Medication Sig Start Date End Date Taking? Authorizing Provider  hydrALAZINE (APRESOLINE) 50 MG tablet Take 1 tablet (50 mg total) by mouth 3 (three) times daily. Patient not taking: Reported on 10/21/2020 03/09/19   Leroy Sea, MD  labetalol (NORMODYNE) 100 MG tablet Take 0.5 tablets (50 mg total) by mouth 2 (two) times daily. Patient not taking: Reported on 10/21/2020 03/09/19   Leroy Sea, MD  phenytoin (DILANTIN) 300 MG ER capsule Take 1 capsule (300 mg total) by mouth at bedtime. Patient not taking: Reported on 10/21/2020 03/09/19   Leroy Sea, MD  UNABLE TO FIND 1 each by Misc.(Non-Drug; Combo Route) route 2 times daily. 05/14/19   [provider]    Physical Exam: Constitutional: Moderately built and nourished. Vitals:   10/22/20 0115 10/22/20 0130 10/22/20 0145 10/22/20 0357  BP: (!) 154/98 (!) 158/96 (!) 165/84 (!) 150/81  Pulse: (!) 54 (!) 54 (!) 52 71  Resp: (!) 21 15 19  (!) 22  Temp:    97.6 F (36.4 C)  TempSrc:    Oral  SpO2: 100% 100% 100% 100%   Eyes: Anicteric no pallor. ENMT: No discharge from the ears eyes nose or mouth. Neck: No mass felt.  No neck rigidity.  No JVD appreciated. Respiratory: No rhonchi or crepitations. Cardiovascular: S1-S2  heard. Abdomen: Soft nontender bowel sounds present. Musculoskeletal: No edema. Skin: No rash. Neurologic: Alert awake oriented to his name and place moving all extremities following commands. Psychiatric: Oriented to his name and place.   Labs on Admission: I have personally reviewed following labs and imaging studies  CBC: Recent Labs  Lab 10/21/20 2304 10/22/20 0004  WBC 4.4  --   NEUTROABS 2.6  --   HGB 13.9 14.6  HCT 42.7 43.0  MCV 101.7*  --   PLT 196  --    Basic Metabolic Panel: Recent Labs  Lab  10/22/20 0004  NA 141  K 4.5  CL 104  GLUCOSE 107*  BUN 20  CREATININE 0.80   GFR: CrCl cannot be calculated (Unknown ideal weight.). Liver Function Tests: No results for input(s): AST, ALT, ALKPHOS, BILITOT, PROT, ALBUMIN in the last 168 hours. No results for input(s): LIPASE, AMYLASE in the last 168 hours. No results for input(s): AMMONIA in the last 168 hours. Coagulation Profile: No results for input(s): INR, PROTIME in the last 168 hours. Cardiac Enzymes: No results for input(s): CKTOTAL, CKMB, CKMBINDEX, TROPONINI in the last 168 hours. BNP (last 3 results) No results for input(s): PROBNP in the last 8760 hours. HbA1C: No results for input(s): HGBA1C in the last 72 hours. CBG: Recent Labs  Lab 10/21/20 2151 10/22/20 0157  GLUCAP 150* 88   Lipid Profile: No results for input(s): CHOL, HDL, LDLCALC, TRIG, CHOLHDL, LDLDIRECT in the last 72 hours. Thyroid Function Tests: No results for input(s): TSH, T4TOTAL, FREET4, T3FREE, THYROIDAB in the last 72 hours. Anemia Panel: No results for input(s): VITAMINB12, FOLATE, FERRITIN, TIBC, IRON, RETICCTPCT in the last 72 hours. Urine analysis:    Component Value Date/Time   COLORURINE YELLOW 10/21/2020 2304   APPEARANCEUR CLEAR 10/21/2020 2304   LABSPEC 1.020 10/21/2020 2304   PHURINE 6.0 10/21/2020 2304   GLUCOSEU NEGATIVE 10/21/2020 2304   HGBUR NEGATIVE 10/21/2020 2304   BILIRUBINUR NEGATIVE 10/21/2020 2304   KETONESUR NEGATIVE 10/21/2020 2304   PROTEINUR 30 (A) 10/21/2020 2304   NITRITE NEGATIVE 10/21/2020 2304   LEUKOCYTESUR NEGATIVE 10/21/2020 2304   Sepsis Labs: @LABRCNTIP (procalcitonin:4,lacticidven:4) ) Recent Results (from the past 240 hour(s))  Resp Panel by RT-PCR (Flu A&B, Covid) Nasopharyngeal Swab     Status: None   Collection Time: 10/22/20  2:56 AM   Specimen: Nasopharyngeal Swab; Nasopharyngeal(NP) swabs in vial transport medium  Result Value Ref Range Status   SARS Coronavirus 2 by RT PCR NEGATIVE  NEGATIVE Final    Comment: (NOTE) SARS-CoV-2 target nucleic acids are NOT DETECTED.  The SARS-CoV-2 RNA is generally detectable in upper respiratory specimens during the acute phase of infection. The lowest concentration of SARS-CoV-2 viral copies this assay can detect is 138 copies/mL. A negative result does not preclude SARS-Cov-2 infection and should not be used as the sole basis for treatment or other patient management decisions. A negative result may occur with  improper specimen collection/handling, submission of specimen other than nasopharyngeal swab, presence of viral mutation(s) within the areas targeted by this assay, and inadequate number of viral copies(<138 copies/mL). A negative result must be combined with clinical observations, patient history, and epidemiological information. The expected result is Negative.  Fact Sheet for Patients:  10/24/20  Fact Sheet for Healthcare Providers:  BloggerCourse.com  This test is no t yet approved or cleared by the [place] FDA and  has been authorized for detection and/or diagnosis of SARS-CoV-2 by FDA under an Emergency Use Authorization (EUA). This  EUA will remain  in effect (meaning this test can be used) for the duration of the COVID-19 declaration under Section 564(b)(1) of the Act, 21 U.S.C.section 360bbb-3(b)(1), unless the authorization is terminated  or revoked sooner.       Influenza A by PCR NEGATIVE NEGATIVE Final   Influenza B by PCR NEGATIVE NEGATIVE Final    Comment: (NOTE) The Xpert Xpress SARS-CoV-2/FLU/RSV plus assay is intended as an aid in the diagnosis of influenza from Nasopharyngeal swab specimens and should not be used as a sole basis for treatment. Nasal washings and aspirates are unacceptable for Xpert Xpress SARS-CoV-2/FLU/RSV testing.  Fact Sheet for Patients: BloggerCourse.comhttps://www.fda.gov/media/152166/download  Fact Sheet for Healthcare  Providers: SeriousBroker.ithttps://www.fda.gov/media/152162/download  This test is not yet approved or cleared by the [place] FDA and has been authorized for detection and/or diagnosis of SARS-CoV-2 by FDA under an Emergency Use Authorization (EUA). This EUA will remain in effect (meaning this test can be used) for the duration of the COVID-19 declaration under Section 564(b)(1) of the Act, 21 U.S.C. section 360bbb-3(b)(1), unless the authorization is terminated or revoked.  Performed at [facility], [address] [place], [place] [address]      Radiological Exams on Admission: CT Head Wo Contrast  Result Date: 10/21/2020 CLINICAL DATA:  Delirium EXAM: CT HEAD WITHOUT CONTRAST TECHNIQUE: Contiguous axial images were obtained from the base of the skull through the vertex without intravenous contrast. COMPARISON:  August 29, 2019 FINDINGS: Brain: No evidence of acute territorial infarction, hemorrhage, hydrocephalus,extra-axial collection or mass lesion/mass effect. There is dilatation the ventricles and sulci consistent with age-related atrophy. Low-attenuation changes in the deep white matter consistent with small vessel ischemia. Small lacunar infarcts are seen within the bilateral basal ganglia. Vascular: No hyperdense vessel or unexpected calcification. Skull: The skull is intact. No fracture or focal lesion identified. Sinuses/Orbits: The visualized paranasal sinuses and mastoid air cells are clear. The orbits and globes intact. Other: None IMPRESSION: No acute intracranial abnormality. Findings consistent with age related atrophy and chronic small vessel ischemia Bilateral basal ganglia lacunar infarcts. Electronically Signed   By: Jonna ClarkBindu  Avutu M.D.   On: 10/21/2020 23:35   DG Chest Portable 1 View  Result Date: 10/21/2020 CLINICAL DATA:  Altered mental status EXAM: PORTABLE CHEST 1 VIEW COMPARISON:  Outside CT 01/15/2020, radiograph 08/29/2019, CT angiography 12/05/2017  FINDINGS: Enlarged cardiac silhouette with a chronically dilated and tortuous calcified aorta. Vascular congestion and cephalization. Hazy mid to lower lung predominant interstitial opacities with some fissural and septal thickening. No pneumothorax or visible effusion. No acute osseous or soft tissue abnormality. Degenerative changes are present in the imaged spine and shoulders. Chronic dextrocurvature of the thoracic spine. Telemetry leads overlie the chest. IMPRESSION: Features suggestive of CHF/volume overload with cardiomegaly, vascular congestion hazy opacity favoring interstitial edema. Underlying infection is not fully excluded. Tortuous and calcified aorta with dilatation of the ascending aorta and arch better seen on prior CT angiography. Electronically Signed   By: Kreg ShropshirePrice  DeHay M.D.   On: 10/21/2020 23:27    EKG: Independently reviewed.  Sinus bradycardia heart rate around 54 beats minute with T wave inversions in the inferolateral leads.  Some of the T wave inversions are seen in the old EKG.  Assessment/Plan Principal Problem:   Acute encephalopathy Active Problems:   Seizure (HCC)   Vascular dementia (HCC)    1. Acute encephalopathy differentials include possible postictal state given the subtherapeutic Dilantin level.  Per report patient's blood glucose was around 50 at  the site when EMS came.  In the ER blood glucose was 107.  ER physician is planning Dilantin loading dose and I have requested pharmacy to dose of Dilantin subsequently.  Closely follow CBGs.  Check hemoglobin A1c with next blood draw. 2. Abnormal chest x-ray showing possible fluid overload.  EKG shows some T wave inversions.  Will cycle cardiac markers check 2D echo and follow intake output Daily weights. 3. History of seizures see #1. 4. Uncontrolled hypertension not sure what medication patient is on.  Will need to discuss with patient's daughter and confirm.  For now we will keep patient on as needed IV labetalol.   Follow blood pressure trends. 5. History of type B thoracic aortic dissection was managed conservatively as per the chart.  Denies any chest pain.  We will closely monitor.  Follow blood pressure trends.  Check 2D echo.  Cardiac markers.  BNP. 6. History of dementia.  Need to confirm home medications. 7. Per chart patient has history of diabetes mellitus.  Was found to be mildly hypoglycemic in the field.  Check hemoglobin A1c with next blood draw and follow metabolic panel. 8. Macrocytic picture -check anemia panel with next blood draw to check for B12 and folate levels.  Appears to be chronic.  We need to get further history from patient's daughter when available.  We will also need to confirm patient's home medications. I have requested pharmacy to help with Dilantin dosing.   DVT prophylaxis: Lovenox. Code Status: Full code. Family Communication: I tried to reach patient's daughter. Disposition Plan: Home when stable. Consults called: None. Admission status: Observation.   Eduard Clos MD [facility] Pager [phone].  If 7PM-7AM, please contact night-coverage [email or web address] Password [email or web address]  10/22/2020, 4:18 AM

## 2020-10-22 NOTE — ED Provider Notes (Addendum)
[facility]-EMERGENCY DEPT Provider Note   CSN: [id number] Arrival date & time: 10/21/20  2137     History Chief Complaint  Patient presents with  . Altered Mental Status    pt was knocking on a strangers door asking for help. Stranger was a Engineer, civil (consulting)nurse and gave pt. juice because she thought his blood sgugar was low. ems was called and pt. was confused     Philip Robinson is a 69 y.o. male.  The history is limited by the condition of the patient (dementia ).  Altered Mental Status Presenting symptoms: confusion, memory loss and partial responsiveness   Severity:  Moderate Most recent episode:  Today Episode history:  Single Timing:  Constant Progression:  Unchanged Chronicity:  New Context: dementia   Associated symptoms: no abdominal pain, no fever and no visual change   Patient with dementia and seizures was found knocking on doors, she thought patient was hypoglycemic and was given juice.  Patient is now AO1 and denies all complaints.       Past Medical History:  Diagnosis Date  . Diabetes mellitus without complication (HCC)   . Hypertension     Patient Active Problem List   Diagnosis Date Noted  . Pain due to onychomycosis of toenails of both feet 12/10/2019  . Diabetes mellitus without complication (HCC) 12/10/2019  . Aortic dissection (HCC) 03/07/2019  . Ascending aortic aneurysm (HCC) 03/07/2019  . 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 Vascular dementia (HCC) 03/07/2019  . Nicotine abuse 03/07/2019  . CVA (cerebral vascular accident) (HCC) 03/07/2019  . Adjustment disorder with mixed anxiety and depressed mood 05/27/2015  . Essential hypertension, malignant   . Hypokalemia   . Seizure (HCC) 05/25/2015  . Acute encephalopathy 05/25/2015  . Delirium     History reviewed. No pertinent surgical history.     History reviewed. No pertinent family history.  Social History   Tobacco Use  . Smoking status: Current Every Day Smoker  .  Smokeless tobacco: Never Used  Substance Use Topics  . Alcohol use: No  . Drug use: No    Home Medications Prior to Admission medications   Medication Sig Start Date End Date Taking? Authorizing Provider  hydrALAZINE (APRESOLINE) 50 MG tablet Take 1 tablet (50 mg total) by mouth 3 (three) times daily. Patient not taking: Reported on 10/21/2020 03/09/19   Leroy SeaSingh, Prashant K, MD  labetalol (NORMODYNE) 100 MG tablet Take 0.5 tablets (50 mg total) by mouth 2 (two) times daily. Patient not taking: Reported on 10/21/2020 03/09/19   Leroy SeaSingh, Prashant K, MD  phenytoin (DILANTIN) 300 MG ER capsule Take 1 capsule (300 mg total) by mouth at bedtime. Patient not taking: Reported on 10/21/2020 03/09/19   Leroy SeaSingh, Prashant K, MD  UNABLE TO FIND 1 each by Misc.(Non-Drug; Combo Route) route 2 times daily. 05/14/19   [provider]    Allergies    Patient has no known allergies.  Review of Systems   Review of Systems  Unable to perform ROS: Dementia  Constitutional: Negative for fever.  Gastrointestinal: Negative for abdominal pain.  Neurological: Negative for tremors.  Psychiatric/Behavioral: Positive for confusion and memory loss.    Physical Exam Updated Vital Signs BP (!) 142/98   Pulse (!) 55   Temp 98.5 F (36.9 C) (Oral)   Resp (!) 26   SpO2 100%   Physical Exam Vitals and nursing note reviewed.  Constitutional:      General: He is not in acute distress.  Appearance: Normal appearance.  HENT:     Head: Normocephalic and atraumatic.     Nose: Nose normal.  Eyes:     Conjunctiva/sclera: Conjunctivae normal.     Pupils: Pupils are equal, round, and reactive to light.  Cardiovascular:     Rate and Rhythm: Normal rate and regular rhythm.     Pulses: Normal pulses.     Heart sounds: Normal heart sounds.  Pulmonary:     Effort: Pulmonary effort is normal.     Breath sounds: Normal breath sounds.  Abdominal:     General: Abdomen is flat. Bowel sounds are normal.      Palpations: Abdomen is soft.     Tenderness: There is no abdominal tenderness. There is no guarding or rebound.  Musculoskeletal:        General: Normal range of motion.     Cervical back: Normal range of motion and neck supple.  Lymphadenopathy:     Cervical: No cervical adenopathy.  Skin:    General: Skin is warm and dry.     Capillary Refill: Capillary refill takes less than 2 seconds.  Neurological:     Mental Status: He is alert.     Cranial Nerves: No cranial nerve deficit.     Deep Tendon Reflexes: Reflexes normal.  Psychiatric:        Mood and Affect: Mood normal.     ED Results / Procedures / Treatments   Labs (all labs ordered are listed, but only abnormal results are displayed) Results for orders placed or performed during the hospital encounter of 10/21/20  CBC with Differential/Platelet  Result Value Ref Range   WBC 4.4 4.0 - 10.5 K/uL   RBC 4.20 (L) 4.22 - 5.81 MIL/uL   Hemoglobin 13.9 13.0 - 17.0 g/dL   HCT 25.0 39 - 52 %   MCV 101.7 (H) 80.0 - 100.0 fL   MCH 33.1 26.0 - 34.0 pg   MCHC 32.6 30.0 - 36.0 g/dL   RDW 53.9 76.7 - 34.1 %   Platelets 196 150 - 400 K/uL   nRBC 0.0 0.0 - 0.2 %   Neutrophils Relative % 58 %   Neutro Abs 2.6 1.7 - 7.7 K/uL   Lymphocytes Relative 27 %   Lymphs Abs 1.2 0.7 - 4.0 K/uL   Monocytes Relative 11 %   Monocytes Absolute 0.5 0.1 - 1.0 K/uL   Eosinophils Relative 2 %   Eosinophils Absolute 0.1 0.0 - 0.5 K/uL   Basophils Relative 1 %   Basophils Absolute 0.0 0.0 - 0.1 K/uL   Immature Granulocytes 1 %   Abs Immature Granulocytes 0.04 0.00 - 0.07 K/uL  Urinalysis, Routine w reflex microscopic Urine, Random  Result Value Ref Range   Color, Urine YELLOW YELLOW   APPearance CLEAR CLEAR   Specific Gravity, Urine 1.020 1.005 - 1.030   pH 6.0 5.0 - 8.0   Glucose, UA NEGATIVE NEGATIVE mg/dL   Hgb urine dipstick NEGATIVE NEGATIVE   Bilirubin Urine NEGATIVE NEGATIVE   Ketones, ur NEGATIVE NEGATIVE mg/dL   Protein, ur 30 (A)  NEGATIVE mg/dL   Nitrite NEGATIVE NEGATIVE   Leukocytes,Ua NEGATIVE NEGATIVE   RBC / HPF 0-5 0 - 5 RBC/hpf   WBC, UA 0-5 0 - 5 WBC/hpf   Bacteria, UA NONE SEEN NONE SEEN   Squamous Epithelial / LPF 0-5 0 - 5   Mucus PRESENT   Rapid urine drug screen (hospital performed)  Result Value Ref Range   Opiates NONE DETECTED  NONE DETECTED   Cocaine NONE DETECTED NONE DETECTED   Benzodiazepines NONE DETECTED NONE DETECTED   Amphetamines NONE DETECTED NONE DETECTED   Tetrahydrocannabinol NONE DETECTED NONE DETECTED   Barbiturates NONE DETECTED NONE DETECTED  Phenytoin level, total  Result Value Ref Range   Phenytoin Lvl 7.3 (L) 10.0 - 20.0 ug/mL  CBG monitoring, ED  Result Value Ref Range   Glucose-Capillary 150 (H) 70 - 99 mg/dL  I-stat chem 8, ED (not at [facility] or [facility])  Result Value Ref Range   Sodium 141 135 - 145 mmol/L   Potassium 4.5 3.5 - 5.1 mmol/L   Chloride 104 98 - 111 mmol/L   BUN 20 8 - 23 mg/dL   Creatinine, Ser 3.01 0.61 - 1.24 mg/dL   Glucose, Bld 601 (H) 70 - 99 mg/dL   Calcium, Ion 0.93 2.35 - 1.40 mmol/L   TCO2 27 22 - 32 mmol/L   Hemoglobin 14.6 13.0 - 17.0 g/dL   HCT 57.3 39 - 52 %   CT Head Wo Contrast  Result Date: 10/21/2020 CLINICAL DATA:  Delirium EXAM: CT HEAD WITHOUT CONTRAST TECHNIQUE: Contiguous axial images were obtained from the base of the skull through the vertex without intravenous contrast. COMPARISON:  August 29, 2019 FINDINGS: Brain: No evidence of acute territorial infarction, hemorrhage, hydrocephalus,extra-axial collection or mass lesion/mass effect. There is dilatation the ventricles and sulci consistent with age-related atrophy. Low-attenuation changes in the deep white matter consistent with small vessel ischemia. Small lacunar infarcts are seen within the bilateral basal ganglia. Vascular: No hyperdense vessel or unexpected calcification. Skull: The skull is intact. No fracture or focal lesion identified. Sinuses/Orbits: The visualized  paranasal sinuses and mastoid air cells are clear. The orbits and globes intact. Other: None IMPRESSION: No acute intracranial abnormality. Findings consistent with age related atrophy and chronic small vessel ischemia Bilateral basal ganglia lacunar infarcts. Electronically Signed   By: Jonna Clark M.D.   On: 10/21/2020 23:35   DG Chest Portable 1 View  Result Date: 10/21/2020 CLINICAL DATA:  Altered mental status EXAM: PORTABLE CHEST 1 VIEW COMPARISON:  Outside CT 01/15/2020, radiograph 08/29/2019, CT angiography 12/05/2017 FINDINGS: Enlarged cardiac silhouette with a chronically dilated and tortuous calcified aorta. Vascular congestion and cephalization. Hazy mid to lower lung predominant interstitial opacities with some fissural and septal thickening. No pneumothorax or visible effusion. No acute osseous or soft tissue abnormality. Degenerative changes are present in the imaged spine and shoulders. Chronic dextrocurvature of the thoracic spine. Telemetry leads overlie the chest. IMPRESSION: Features suggestive of CHF/volume overload with cardiomegaly, vascular congestion hazy opacity favoring interstitial edema. Underlying infection is not fully excluded. Tortuous and calcified aorta with dilatation of the ascending aorta and arch better seen on prior CT angiography. Electronically Signed   By: Kreg Shropshire M.D.   On: 10/21/2020 23:27    EKG  EKG Interpretation  Date/Time:  Friday October 22 2020 01:34:50 EST Ventricular Rate:  54 PR Interval:    QRS Duration: 96 QT Interval:  459 QTC Calculation: 435 R Axis:   74 Text Interpretation: Sinus rhythm Probable left atrial enlargement Anteroseptal infarct, old Abnormal T, consider ischemia, lateral leads Confirmed by Nicanor Alcon, Emillie Chasen ([id number]) on 10/22/2020 1:37:13 AM       Radiology CT Head Wo Contrast  Result Date: 10/21/2020 CLINICAL DATA:  Delirium EXAM: CT HEAD WITHOUT CONTRAST TECHNIQUE: Contiguous axial images were obtained from the  base of the skull through the vertex without intravenous contrast. COMPARISON:  August 29, 2019 FINDINGS: Brain: No  evidence of acute territorial infarction, hemorrhage, hydrocephalus,extra-axial collection or mass lesion/mass effect. There is dilatation the ventricles and sulci consistent with age-related atrophy. Low-attenuation changes in the deep white matter consistent with small vessel ischemia. Small lacunar infarcts are seen within the bilateral basal ganglia. Vascular: No hyperdense vessel or unexpected calcification. Skull: The skull is intact. No fracture or focal lesion identified. Sinuses/Orbits: The visualized paranasal sinuses and mastoid air cells are clear. The orbits and globes intact. Other: None IMPRESSION: No acute intracranial abnormality. Findings consistent with age related atrophy and chronic small vessel ischemia Bilateral basal ganglia lacunar infarcts. Electronically Signed   By: Jonna Clark M.D.   On: 10/21/2020 23:35   DG Chest Portable 1 View  Result Date: 10/21/2020 CLINICAL DATA:  Altered mental status EXAM: PORTABLE CHEST 1 VIEW COMPARISON:  Outside CT 01/15/2020, radiograph 08/29/2019, CT angiography 12/05/2017 FINDINGS: Enlarged cardiac silhouette with a chronically dilated and tortuous calcified aorta. Vascular congestion and cephalization. Hazy mid to lower lung predominant interstitial opacities with some fissural and septal thickening. No pneumothorax or visible effusion. No acute osseous or soft tissue abnormality. Degenerative changes are present in the imaged spine and shoulders. Chronic dextrocurvature of the thoracic spine. Telemetry leads overlie the chest. IMPRESSION: Features suggestive of CHF/volume overload with cardiomegaly, vascular congestion hazy opacity favoring interstitial edema. Underlying infection is not fully excluded. Tortuous and calcified aorta with dilatation of the ascending aorta and arch better seen on prior CT angiography. Electronically  Signed   By: Kreg Shropshire M.D.   On: 10/21/2020 23:27    Procedures Procedures (including critical care time)  Medications Ordered in ED Medications  furosemide (LASIX) injection 40 mg (has no administration in time range)    ED Course  I have reviewed the triage vital signs and the nursing notes.  Pertinent labs & imaging results that were available during my care of the patient were reviewed by me and considered in my medical decision making (see chart for details).    Admit for AMS and pulmonary edema.  Patient will be reloaded with fosphenytoin given this may have been a seizure as well.  No IVF given but is in pulmonary edema.  Also has an abnormal EKG.  Will cycle troponins and obtain echo.     Final Clinical Impression(s) / ED Diagnoses Final diagnoses:  Altered mental status, unspecified altered mental status type  Acute pulmonary edema [facility])   Admit to medicine        Lliam Hoh, MD 10/22/20 0139

## 2020-10-22 NOTE — ED Notes (Signed)
Called daughter to come to ED and she is on her way due to patient cussing [place] out staff and not wanting to stay.

## 2020-10-22 NOTE — ED Notes (Addendum)
Pt. Started cussing at staff wanting to go home. CN and Hospitalist made aware. Pt. IV removed and needle intact.

## 2020-10-22 NOTE — ED Notes (Signed)
Patient's daughter has arrived to take patient home. Dr.Kakrakandy spoke with daughter and patient,  explaining the health risk of leaving against medical advice. Patient and daughter both agreeable to leaving AMA. AMA formed signed by daughter.

## 2020-12-04 ENCOUNTER — Other Ambulatory Visit: Payer: Self-pay

## 2020-12-04 ENCOUNTER — Inpatient Hospital Stay (HOSPITAL_COMMUNITY)
Admission: EM | Admit: 2020-12-04 | Discharge: 2020-12-06 | DRG: 101 | Disposition: A | Discharge status: Home / Self Care | Payer: Medicare Other | Attending: Internal Medicine | Admitting: Internal Medicine

## 2020-12-04 DIAGNOSIS — G40901 Epilepsy, unspecified, not intractable, with status epilepticus: Secondary | ICD-10-CM | POA: Diagnosis not present

## 2020-12-04 DIAGNOSIS — F0151 Vascular dementia with behavioral disturbance: Secondary | ICD-10-CM | POA: Diagnosis not present

## 2020-12-04 DIAGNOSIS — I712 Thoracic aortic aneurysm, without rupture: Secondary | ICD-10-CM | POA: Diagnosis present

## 2020-12-04 DIAGNOSIS — I723 Aneurysm of iliac artery: Secondary | ICD-10-CM | POA: Diagnosis present

## 2020-12-04 DIAGNOSIS — I10 Essential (primary) hypertension: Secondary | ICD-10-CM | POA: Diagnosis present

## 2020-12-04 DIAGNOSIS — E119 Type 2 diabetes mellitus without complications: Secondary | ICD-10-CM | POA: Diagnosis present

## 2020-12-04 DIAGNOSIS — Z79899 Other long term (current) drug therapy: Secondary | ICD-10-CM

## 2020-12-04 DIAGNOSIS — F17210 Nicotine dependence, cigarettes, uncomplicated: Secondary | ICD-10-CM | POA: Diagnosis present

## 2020-12-04 DIAGNOSIS — Z716 Tobacco abuse counseling: Secondary | ICD-10-CM

## 2020-12-04 DIAGNOSIS — Z20822 Contact with and (suspected) exposure to covid-19: Secondary | ICD-10-CM | POA: Diagnosis present

## 2020-12-04 LAB — COMPREHENSIVE METABOLIC PANEL
ALT: 15 U/L (ref 0–44)
AST: 16 U/L (ref 15–41)
Albumin: 3.8 g/dL (ref 3.5–5.0)
Alkaline Phosphatase: 93 U/L (ref 38–126)
Anion gap: 9 (ref 5–15)
BUN: 14 mg/dL (ref 8–23)
CO2: 24 mmol/L (ref 22–32)
Calcium: 9.1 mg/dL (ref 8.9–10.3)
Chloride: 104 mmol/L (ref 98–111)
Creatinine, Ser: 1.02 mg/dL (ref 0.61–1.24)
GFR, Estimated: >60 mL/min (ref >60)
Glucose, Bld: 69 mg/dL — ABNORMAL LOW (ref 70–99)
Potassium: 4.4 mmol/L (ref 3.5–5.1)
Sodium: 137 mmol/L (ref 135–145)
Total Bilirubin: 1 mg/dL (ref 0.3–1.2)
Total Protein: 6.9 g/dL (ref 6.5–8.1)

## 2020-12-04 LAB — CBC WITH DIFFERENTIAL/PLATELET
Abs Immature Granulocytes: 0 10*3/uL (ref 0.00–0.07)
Basophils Absolute: 0 10*3/uL (ref 0.0–0.1)
Basophils Relative: 1 %
Eosinophils Absolute: 0.1 10*3/uL (ref 0.0–0.5)
Eosinophils Relative: 2 %
HCT: 43.6 % (ref 39.0–52.0)
Hemoglobin: 14.6 g/dL (ref 13.0–17.0)
Immature Granulocytes: 0 %
Lymphocytes Relative: 22 %
Lymphs Abs: 0.8 10*3/uL (ref 0.7–4.0)
MCH: 33.8 pg (ref 26.0–34.0)
MCHC: 33.5 g/dL (ref 30.0–36.0)
MCV: 100.9 fL — ABNORMAL HIGH (ref 80.0–100.0)
Monocytes Absolute: 0.5 10*3/uL (ref 0.1–1.0)
Monocytes Relative: 15 %
Neutro Abs: 2.1 10*3/uL (ref 1.7–7.7)
Neutrophils Relative %: 60 %
Platelets: 184 10*3/uL (ref 150–400)
RBC: 4.32 MIL/uL (ref 4.22–5.81)
RDW: 12 % (ref 11.5–15.5)
WBC: 3.4 10*3/uL — ABNORMAL LOW (ref 4.0–10.5)
nRBC: 0 % (ref 0.0–0.2)

## 2020-12-04 LAB — CBG MONITORING, ED: Glucose-Capillary: 75 mg/dL (ref 70–99)

## 2020-12-04 LAB — PHENYTOIN LEVEL, TOTAL: Phenytoin Lvl: 3.8 ug/mL — ABNORMAL LOW (ref 10.0–20.0)

## 2020-12-04 MED ORDER — SODIUM CHLORIDE 0.9 % IV SOLN
450.0000 mg | Freq: Once | INTRAVENOUS | Status: AC
  Administered 2020-12-04: 450 mg via INTRAVENOUS
  Filled 2020-12-04: qty 9

## 2020-12-04 MED ORDER — SODIUM CHLORIDE 0.9 % IV SOLN
900.0000 mg | Freq: Once | INTRAVENOUS | Status: AC
  Administered 2020-12-05: 900 mg via INTRAVENOUS
  Filled 2020-12-04: qty 18

## 2020-12-04 NOTE — ED Provider Notes (Signed)
[facility] EMERGENCY DEPARTMENT Provider Note   CSN: [id number] Arrival date & time: 12/04/20  2009     History Chief Complaint  Patient presents with  . Seizures    Philip Robinson is a 70 y.o. male.  HPI     Pt has hx of seizure, DM, HTN and comes in to the ER with chief complaint of seizures.  Patient reports that he has generally speaking well controlled seizure and is compliant with his medications.  He is unsure what happened today.  He was at his home.  He has dementia, therefore I also decided to call his daughter.   Patient's daughter, Ms. Dareen Piano reports that she gives her father his medication every morning.  He probably had 4-5 seizures last year.  Patient follows up with neurologist and has been on the same Dilantin regimen for a while.  She denies any recent infection, stress, heavy drinking, lack of sleep, medication changes.  She states that patient started having blank stare followed by generalized shaking.  The episode lasted for about 15 minutes.  Patient denies any headache, neck pain.   Past Medical History:  Diagnosis Date  . Diabetes mellitus without complication (HCC)   . Hypertension     Patient Active Problem List   Diagnosis Date Noted  . Pain due to onychomycosis of toenails of both feet 12/10/2019  . Diabetes mellitus without complication (HCC) 12/10/2019  . Aortic dissection (HCC) 03/07/2019  . Ascending aortic aneurysm (HCC) 03/07/2019  . 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 Vascular dementia (HCC) 03/07/2019  . Nicotine abuse 03/07/2019  . CVA (cerebral vascular accident) (HCC) 03/07/2019  . Adjustment disorder with mixed anxiety and depressed mood 05/27/2015  . Essential hypertension, malignant   . Hypokalemia   . Seizure (HCC) 05/25/2015  . Acute encephalopathy 05/25/2015  . Delirium     No past surgical history on file.     Family History  Family history unknown: Yes    Social History   Tobacco Use   . Smoking status: Current Every Day Smoker  . Smokeless tobacco: Never Used  Substance Use Topics  . Alcohol use: No  . Drug use: No    Home Medications Prior to Admission medications   Medication Sig Start Date End Date Taking? Authorizing Provider  hydrALAZINE (APRESOLINE) 50 MG tablet Take 1 tablet (50 mg total) by mouth 3 (three) times daily. Patient not taking: Reported on 10/21/2020 03/09/19   Leroy Sea, MD  labetalol (NORMODYNE) 100 MG tablet Take 0.5 tablets (50 mg total) by mouth 2 (two) times daily. Patient not taking: Reported on 10/21/2020 03/09/19   Leroy Sea, MD  phenytoin (DILANTIN) 300 MG ER capsule Take 1 capsule (300 mg total) by mouth at bedtime. Patient not taking: Reported on 10/21/2020 03/09/19   Leroy Sea, MD  UNABLE TO FIND 1 each by Misc.(Non-Drug; Combo Route) route 2 times daily. 05/14/19   [provider]    Allergies    Patient has no known allergies.  Review of Systems   Review of Systems  Constitutional: Positive for activity change.  Respiratory: Negative for shortness of breath.   Cardiovascular: Negative for chest pain.  Gastrointestinal: Negative for nausea and vomiting.  Neurological: Positive for seizures.  Hematological: Does not bruise/bleed easily.  All other systems reviewed and are negative.   Physical Exam Updated Vital Signs BP (!) 172/102 (BP Location: Right Arm)   Pulse 67   Temp 98.8 F (37.1 C) (  Oral)   Resp 18   Ht 6' (1.829 m)   Wt 94 kg   SpO2 100%   BMI 28.11 kg/m   Physical Exam Vitals and nursing note reviewed.  Constitutional:      Appearance: He is well-developed.  HENT:     Head: Atraumatic.  Eyes:     Extraocular Movements: Extraocular movements intact.     Pupils: Pupils are equal, round, and reactive to light.  Cardiovascular:     Rate and Rhythm: Normal rate.  Pulmonary:     Effort: Pulmonary effort is normal.  Musculoskeletal:     Cervical back: Neck supple.  Skin:     General: Skin is warm.  Neurological:     Mental Status: He is alert. Mental status is at baseline.     Cranial Nerves: No cranial nerve deficit.     ED Results / Procedures / Treatments   Labs (all labs ordered are listed, but only abnormal results are displayed) Labs Reviewed  CBC WITH DIFFERENTIAL/PLATELET - Abnormal; Notable for the following components:      Result Value   WBC 3.4 (*)    MCV 100.9 (*)    All other components within normal limits  PHENYTOIN LEVEL, TOTAL - Abnormal; Notable for the following components:   Phenytoin Lvl 3.8 (*)    All other components within normal limits  COMPREHENSIVE METABOLIC PANEL - Abnormal; Notable for the following components:   Glucose, Bld 69 (*)    All other components within normal limits  SARS CORONAVIRUS 2 (TAT 6-24 HRS)  CBG MONITORING, ED    EKG None  Radiology No results found.  Procedures .Critical Care Performed by: Derwood Kaplan, MD Authorized by: Derwood Kaplan, MD   Critical care provider statement:    Critical care time (minutes):  56   Critical care was necessary to treat or prevent imminent or life-threatening deterioration of the following conditions:  CNS failure or compromise   Critical care was time spent personally by me on the following activities:  Discussions with consultants, evaluation of patient's response to treatment, examination of patient, ordering and performing treatments and interventions, ordering and review of laboratory studies, ordering and review of radiographic studies, pulse oximetry, re-evaluation of patient's condition, obtaining history from patient or surrogate and review of old charts   (including critical care time)  Medications Ordered in ED Medications  phenytoin (DILANTIN) 450 mg in sodium chloride 0.9 % 100 mL IVPB (450 mg Intravenous New Bag/Given 12/04/20 2329)  phenytoin (DILANTIN) 900 mg in sodium chloride 0.9 % 250 mL IVPB (has no administration in time range)    ED  Course  I have reviewed the triage vital signs and the nursing notes.  Pertinent labs & imaging results that were available during my care of the patient were reviewed by me and considered in my medical decision making (see chart for details).  Clinical Course as of 12/04/20 2354  Sat Dec 04, 2020  2353 I discussed the case with Dr. Roda Shutters, Neurologist, he advises that we give another 900 mg of IV Dilantin so that the total is 1.35 g.  He also wants patient to be admitted to the hospital.  They will see the patient and make recommendation on medication changes. [AN]    Clinical Course User Index [AN] Derwood Kaplan, MD   MDM Rules/Calculators/A&P                          DDx: -  Seizure disorder -Meningitis -Trauma -ICH -Electrolyte abnormality -Metabolic derangement -Stroke -Toxin induced seizures -Medication side effects -Hypoxia -Hypoglycemia  70 year old comes in a chief complaint of seizure.  The seizure he had today was different than his usual seizure.  Typically the seizure is what of blank stare with minimal jerking, today he had generalized tonic-clonic activity -the seizure lasted for 20 minutes.  By definition he is status epilepticus.  I discussed the case with neurologist. Based on history, it does not appear there is an underlying infection or any change to medications that might have triggered this.  Final Clinical Impression(s) / ED Diagnoses Final diagnoses:  Status epilepticus [facility])    Rx / DC Orders ED Discharge Orders    None       Varney Biles, MD 12/04/20 2357

## 2020-12-04 NOTE — ED Triage Notes (Signed)
Family called EMS for witnessed seizure while pt was in bed. Per family Pt has hx of seizures and is compliant with dilantin. Pt did experience episode of incontinence.  Hx of dementia-- at baseline.

## 2020-12-05 ENCOUNTER — Inpatient Hospital Stay (HOSPITAL_COMMUNITY): Payer: Medicare Other

## 2020-12-05 ENCOUNTER — Observation Stay (HOSPITAL_COMMUNITY): Payer: Medicare Other

## 2020-12-05 DIAGNOSIS — G40901 Epilepsy, unspecified, not intractable, with status epilepticus: Principal | ICD-10-CM

## 2020-12-05 DIAGNOSIS — F0150 Vascular dementia without behavioral disturbance: Secondary | ICD-10-CM

## 2020-12-05 DIAGNOSIS — R569 Unspecified convulsions: Secondary | ICD-10-CM

## 2020-12-05 DIAGNOSIS — F0151 Vascular dementia with behavioral disturbance: Secondary | ICD-10-CM | POA: Diagnosis not present

## 2020-12-05 DIAGNOSIS — F17210 Nicotine dependence, cigarettes, uncomplicated: Secondary | ICD-10-CM | POA: Diagnosis present

## 2020-12-05 DIAGNOSIS — G40909 Epilepsy, unspecified, not intractable, without status epilepticus: Secondary | ICD-10-CM | POA: Diagnosis not present

## 2020-12-05 DIAGNOSIS — Z79899 Other long term (current) drug therapy: Secondary | ICD-10-CM | POA: Diagnosis not present

## 2020-12-05 DIAGNOSIS — Z20822 Contact with and (suspected) exposure to covid-19: Secondary | ICD-10-CM | POA: Diagnosis present

## 2020-12-05 DIAGNOSIS — Z720 Tobacco use: Secondary | ICD-10-CM

## 2020-12-05 DIAGNOSIS — I712 Thoracic aortic aneurysm, without rupture: Secondary | ICD-10-CM | POA: Diagnosis present

## 2020-12-05 DIAGNOSIS — I723 Aneurysm of iliac artery: Secondary | ICD-10-CM | POA: Diagnosis present

## 2020-12-05 DIAGNOSIS — E119 Type 2 diabetes mellitus without complications: Secondary | ICD-10-CM | POA: Diagnosis present

## 2020-12-05 DIAGNOSIS — I10 Essential (primary) hypertension: Secondary | ICD-10-CM | POA: Diagnosis present

## 2020-12-05 DIAGNOSIS — Z716 Tobacco abuse counseling: Secondary | ICD-10-CM | POA: Diagnosis not present

## 2020-12-05 LAB — CBC WITH DIFFERENTIAL/PLATELET
Abs Immature Granulocytes: 0.01 10*3/uL (ref 0.00–0.07)
Basophils Absolute: 0 10*3/uL (ref 0.0–0.1)
Basophils Relative: 1 %
Eosinophils Absolute: 0.1 10*3/uL (ref 0.0–0.5)
Eosinophils Relative: 2 %
HCT: 39.8 % (ref 39.0–52.0)
Hemoglobin: 14.2 g/dL (ref 13.0–17.0)
Immature Granulocytes: 0 %
Lymphocytes Relative: 17 %
Lymphs Abs: 0.7 10*3/uL (ref 0.7–4.0)
MCH: 34.2 pg — ABNORMAL HIGH (ref 26.0–34.0)
MCHC: 35.7 g/dL (ref 30.0–36.0)
MCV: 95.9 fL (ref 80.0–100.0)
Monocytes Absolute: 0.7 10*3/uL (ref 0.1–1.0)
Monocytes Relative: 15 %
Neutro Abs: 2.8 10*3/uL (ref 1.7–7.7)
Neutrophils Relative %: 65 %
Platelets: 180 10*3/uL (ref 150–400)
RBC: 4.15 MIL/uL — ABNORMAL LOW (ref 4.22–5.81)
RDW: 12 % (ref 11.5–15.5)
WBC: 4.3 10*3/uL (ref 4.0–10.5)
nRBC: 0 % (ref 0.0–0.2)

## 2020-12-05 LAB — BASIC METABOLIC PANEL
Anion gap: 9 (ref 5–15)
BUN: 14 mg/dL (ref 8–23)
CO2: 23 mmol/L (ref 22–32)
Calcium: 8.9 mg/dL (ref 8.9–10.3)
Chloride: 102 mmol/L (ref 98–111)
Creatinine, Ser: 0.95 mg/dL (ref 0.61–1.24)
GFR, Estimated: >60 mL/min (ref >60)
Glucose, Bld: 130 mg/dL — ABNORMAL HIGH (ref 70–99)
Potassium: 4.1 mmol/L (ref 3.5–5.1)
Sodium: 134 mmol/L — ABNORMAL LOW (ref 135–145)

## 2020-12-05 LAB — MAGNESIUM: Magnesium: 1.7 mg/dL (ref 1.7–2.4)

## 2020-12-05 LAB — SARS CORONAVIRUS 2 (TAT 6-24 HRS): SARS Coronavirus 2: NEGATIVE

## 2020-12-05 LAB — CBG MONITORING, ED: Glucose-Capillary: 78 mg/dL (ref 70–99)

## 2020-12-05 LAB — PHENYTOIN LEVEL, TOTAL: Phenytoin Lvl: 16.7 ug/mL (ref 10.0–20.0)

## 2020-12-05 MED ORDER — ONDANSETRON HCL 4 MG PO TABS: 4.0000 mg | ORAL_TABLET | Freq: Four times a day (QID) | ORAL | Status: DC | PRN

## 2020-12-05 MED ORDER — ENOXAPARIN SODIUM 40 MG/0.4ML ~~LOC~~ SOLN
40.0000 mg | Freq: Every day | SUBCUTANEOUS | Status: DC
  Administered 2020-12-05 – 2020-12-06 (×2): 40 mg via SUBCUTANEOUS
  Filled 2020-12-05 (×2): qty 0.4

## 2020-12-05 MED ORDER — ACETAMINOPHEN 650 MG RE SUPP: 650.0000 mg | RECTAL | Status: DC | PRN

## 2020-12-05 MED ORDER — ACETAMINOPHEN 325 MG PO TABS: 650.0000 mg | ORAL_TABLET | ORAL | Status: DC | PRN

## 2020-12-05 MED ORDER — ONDANSETRON HCL 4 MG/2ML IJ SOLN: 4.0000 mg | Freq: Four times a day (QID) | INTRAMUSCULAR | Status: DC | PRN

## 2020-12-05 MED ORDER — LABETALOL HCL 5 MG/ML IV SOLN
5.0000 mg | INTRAVENOUS | Status: DC | PRN
  Administered 2020-12-05 (×2): 5 mg via INTRAVENOUS
  Filled 2020-12-05 (×3): qty 4

## 2020-12-05 MED ORDER — PHENYTOIN SODIUM EXTENDED 100 MG PO CAPS
400.0000 mg | ORAL_CAPSULE | Freq: Every day | ORAL | Status: DC
  Administered 2020-12-05 – 2020-12-06 (×2): 400 mg via ORAL
  Filled 2020-12-05 (×3): qty 4

## 2020-12-05 MED ORDER — LORAZEPAM 2 MG/ML IJ SOLN: 4.0000 mg | INTRAMUSCULAR | Status: DC

## 2020-12-05 MED ORDER — MELATONIN 5 MG PO TABS
5.0000 mg | ORAL_TABLET | Freq: Every evening | ORAL | Status: DC | PRN
Start: 2020-12-05 — End: 2020-12-07
  Administered 2020-12-05: 5 mg via ORAL
  Filled 2020-12-05 (×2): qty 1

## 2020-12-05 NOTE — ED Notes (Addendum)
Called daughter, w/ pt's permission to give an update.  She indicated that he has a hx of becoming very agitated in the hospital and needs something to help him calm down and relax.  Pt does have hx of dementia, RN is starting to see some signs, have had to re orientate several times.  Please call w/ updates.

## 2020-12-05 NOTE — Progress Notes (Signed)
No charge progress note.  Philip Robinson is a 70 y.o. male with hx of seizures, dementia, tobacco abuse, hypertension, non-insulin-dependent type 2 diabetes, who presented on 12/04/2020 with prolonged seizure. Her Dilantin levels were low so it is thought to be due to breakthrough seizures.  Long-term dose was increased by neurology and repeat check this morning was within therapeutic range.  Patient did not had any more seizure.  Neurology is recommending repeat EEG and observation overnight.  Can be discharged tomorrow if remains stable.

## 2020-12-05 NOTE — ED Notes (Signed)
Attempted to call report; RN unable to take report at this time.

## 2020-12-05 NOTE — Progress Notes (Addendum)
Due to low Dilantin level at presentation, the patient likely is rapidly metabolizing this medication. Scheduled dosage has been increased to 350 mg po qd. Can use immediate release form once per day as the half life of Dilantin is on average 22 hours. Will need repeat Dilantin level prior to discharge; please call Neurology if the level is not in the therapeutic range.   EEG is pending.   Addendum: - Switching to extended-release phenytoin at 400 mg po daily. Appreciate Pharmacy assistance.  - EEG completed. Findings of sharp waves, right > left frontal-anterior temporal region and background slowing. The study shows evidence of independent epileptogenicity arising from right > left frontal-anterior temporal region. There is also mild diffuse encephalopathy, non specific etiology. No seizures were seen throughout the recording.  Electronically signed: Dr. Caryl Pina

## 2020-12-05 NOTE — Procedures (Addendum)
Patient Name: Philip Robinson  MRN: [id number]  Epilepsy Attending: Charlsie Quest  Referring Physician/Provider: Dr Marvel Plan Date: 12/05/2020 Duration: 24.07 mins  Patient history: 70yo M presented with breakthrough seizure. EEG to evaluate for seizure  Level of alertness: Awake, asleep  AEDs during EEG study: PHT  Technical aspects: This EEG study was done with scalp electrodes positioned according to the 10-20 International system of electrode placement. Electrical activity was acquired at a sampling rate of 500Hz  and reviewed with a high frequency filter of 70Hz  and a low frequency filter of 1Hz . EEG data were recorded continuously and digitally stored.   Description: The posterior dominant rhythm consists of 7 Hz activity of moderate voltage (25-35 uV) seen predominantly in posterior head regions, symmetric and reactive to eye opening and eye closing. Sleep was characterized by vertex waves, sleep spindles (12 to 14 Hz), maximal frontocentral region. Sharp waves were seen in right> left frontal-anterior temporal region, independently. Hyperventilation and photic stimulation were not performed.     ABNORMALITY -Sharp wave, right> left frontal-anterior temporal region - Background slow  IMPRESSION: This study showed evidence of independent epileptogenicity arising from right> left frontal-anterior temporal region. There is also mild diffuse encephalopathy, non specific etiology. No seizures were seen throughout the recording.  Blake Vetrano 

## 2020-12-05 NOTE — ED Notes (Signed)
Tele  Breakfast Ordered 

## 2020-12-05 NOTE — Consult Note (Signed)
Neurology Consultation Note  Consult Requested by: Dr. Marikay Alar  Reason for Consult: Seizure  Consult Date: 12/05/20   The history was obtained from the EDP and pt. patient is not a good historian.  During history and examination, all items were not able to obtain unless otherwise noted.  History of Present Illness:  Philip Robinson is a 70 y.o. African American male with PMH of seizure on Dilantin, dementia, smoker, hypertension, diabetes presented to ER after seizure episode.  Patient is not a good historian, he did not know why he is in the hospital.  Per EDP, patient family wean as patient had a seizure today with tonic-clonic activity for 20 minutes.  Patient denies any tongue biting but stated that he did had urinary incontinence but no bowel incontinence. Patient denies any headache, dizziness, nausea vomiting, weakness or numbness.   Currently patient back to his baseline.  He is taking Dilantin at home for seizure disorder, Dilantin level 3.8. Per EDP, patient had 4-5 seizures for the last year, Dilantin level was low in August and November 2021.  Per EDP, daughter stated she gave patient Dilantin every day and he is compliant with medication.  CT head no acute abnormality   Past Medical History:  Diagnosis Date  . Diabetes mellitus without complication (HCC)   . Hypertension     No past surgical history on file.  Family History  Family history unknown: Yes    Social History:  reports that he has been smoking. He has never used smokeless tobacco. He reports that he does not drink alcohol and does not use drugs.  Allergies: No Known Allergies  No current facility-administered medications on file prior to encounter.   Current Outpatient Medications on File Prior to Encounter  Medication Sig Dispense Refill  . hydrALAZINE (APRESOLINE) 50 MG tablet Take 1 tablet (50 mg total) by mouth 3 (three) times daily. (Patient not taking: Reported on 10/21/2020) 90 tablet 0  . labetalol  (NORMODYNE) 100 MG tablet Take 0.5 tablets (50 mg total) by mouth 2 (two) times daily. (Patient not taking: Reported on 10/21/2020) 60 tablet 0  . phenytoin (DILANTIN) 300 MG ER capsule Take 1 capsule (300 mg total) by mouth at bedtime. (Patient not taking: Reported on 10/21/2020) 30 capsule 0  . UNABLE TO FIND 1 each by Misc.(Non-Drug; Combo Route) route 2 times daily.      Review of Systems: A full ROS was attempted today and was not able to be performed due to poor memory.    Physical Examination: Temp:  [98.8 F (37.1 C)] 98.8 F (37.1 C) (01/01 2013) Pulse Rate:  [67-86] 84 (01/02 0003) Resp:  [16-18] 18 (01/02 0003) BP: (160-172)/(102-113) 164/106 (01/02 0003) SpO2:  [98 %-100 %] 100 % (01/02 0003) Weight:  [94 kg] 94 kg (01/01 2016)  General - well nourished, well developed, in no apparent distress.    Ophthalmologic - fundi not visualized due to noncooperation.    Cardiovascular - regular rhythm and rate  Mental Status -  Mildly sleepy, orientation to self and place, not orientated to time, age and situation.  Language including expression, naming, repetition, comprehension  was assessed and found intact.  Cranial Nerves II - XII - II - Vision intact OU. III, IV, VI - Extraocular movements intact. V - Facial sensation intact bilaterally. VII - Facial movement intact bilaterally. VIII - Hearing & vestibular intact bilaterally. X - Palate elevates symmetrically. XI - Chin turning & shoulder shrug intact bilaterally. XII - Tongue protrusion  intact.  Motor Strength - The patient's strength was normal in all extremities and pronator drift was absent.   Motor Tone & Bulk - Muscle tone was assessed at the neck and appendages and was normal.  Bulk was normal and fasciculations were absent.   Reflexes - The patient's reflexes were normal in all extremities and he had no pathological reflexes.  Sensory - Light touch, temperature/pinprick were assessed and were normal.     Coordination - The patient had normal movements in the hands with no ataxia or dysmetria.  Tremor was absent.  Gait and Station - deferred  Data Reviewed: CT Head Wo Contrast  Result Date: 12/05/2020 CLINICAL DATA:  Seizure EXAM: CT HEAD WITHOUT CONTRAST TECHNIQUE: Contiguous axial images were obtained from the base of the skull through the vertex without intravenous contrast. COMPARISON:  October 21, 2020 FINDINGS: Brain: No evidence of acute territorial infarction, hemorrhage, hydrocephalus,extra-axial collection or mass lesion/mass effect. There is dilatation the ventricles and sulci consistent with age-related atrophy. Low-attenuation changes in the deep white matter consistent with small vessel ischemia. Vascular: No hyperdense vessel or unexpected calcification. Skull: The skull is intact. No fracture or focal lesion identified. Sinuses/Orbits: The visualized paranasal sinuses and mastoid air cells are clear. The orbits and globes intact. Other: None IMPRESSION: No acute intracranial abnormality. Findings consistent with age related atrophy and chronic small vessel ischemia Electronically Signed   By: Jonna Clark M.D.   On: 12/05/2020 00:43    Assessment: 70 y.o. male with PMH of seizure on Dilantin, dementia, smoker, hypertension, diabetes presented to ER after seizure GTC episode lasting 20 minutes.  Now back to baseline.  Dilantin level 3.8, although daughter give patient medication every day per EDP.  Will need Dilantin load 1.35g to reach dilantin level between 15-20.  Check Dilantin level in a.m. May consider increase Dilantin home dose.  Recommend overnight observation to make sure no further seizure. EEG in am.  Plan: - fosphenytoin load for total 1.35g - dilantin level in am - EEG in am - overnight observation - seizure precautions - may consider change home dilantin dosage for better seizure control - will follow  Thank you for this consultation and allowing [place] to  participate in the care of this patient.  Marvel Plan, MD PhD Stroke Neurology 12/05/2020 1:21 AM

## 2020-12-05 NOTE — ED Notes (Signed)
Pt cleaned of urinary incontinence and clean brief/gown placed on patient. Pt unable to stand without assistance. Pt placed back in bed. Pt eating breakfast at this time.

## 2020-12-05 NOTE — H&P (Signed)
History and Physical  Patient Name: Philip Robinson     NGE:[id number]    DOB: 1951-03-09    DOA: 12/04/2020 PCP: Edmonia James, NP  Patient coming from: Home  Chief Complaint: Seizure   HPI: Philip Robinson is a 70 y.o. male with hx of seizures, dementia, tobacco abuse, hypertension, non-insulin-dependent type 2 diabetes, who presented on 12/04/2020 with status epilepticus.  Patient is a poor historian, with baseline dementia, thus a complete and thorough HPI unable to be obtained. He did not realize he had a seizure. He had no complaints during the time my exam. He denies any shortness of breath, chest pain, headache, or generalized weakness.  Apparently, family witnessed patient have a seizure while in bed. Apparently it was tonic-clonic which was a new type of seizure for him. The daughter stated that he has been compliant with his medication, she gives it to him every morning. He has a significant history of seizures, with approximately 4-5 the past year. He is followed by neurology outpatient. He has been on the same Dilantin regimen for some time. Daughter denies any recent infection, stress, lack of sleep. He did not hit his head during the episode.   ED course: -Initial presentation, afebrile, heart rate 86, blood pressure 160/106, maintaining sats on room air -Relevant labs on presentation: Sodium 137, potassium 4.4, chloride 104, bicarb 24, glucose 69, creatinine 1.02, calcium 9.1, hemoglobin 14.6, platelets 184, phenytoin level 3.8, Covid negative -Neurology consulted in ED. Initially was given 450 mg by ED provider but neurology recommend giving another 900 mg. -Neurology recommended placing patient labs for further evaluation     ROS:   A complete and thorough review of systems obtained, negative unless stated in the HPI.    Past Medical History:  Diagnosis Date  . Diabetes mellitus without complication ([place])   . Hypertension     No past surgical history on  file.  Social History: Patient lives at home.  The patient walks unassisted. Current smoker.  No Known Allergies  Family history: Family history is unknown by patient.  Prior to Admission medications   Medication Sig Start Date End Date Taking? Authorizing Provider  hydrALAZINE (APRESOLINE) 50 MG tablet Take 1 tablet (50 mg total) by mouth 3 (three) times daily. Patient not taking: Reported on 10/21/2020 03/09/19   Thurnell Lose, MD  labetalol (NORMODYNE) 100 MG tablet Take 0.5 tablets (50 mg total) by mouth 2 (two) times daily. Patient not taking: Reported on 10/21/2020 03/09/19   Thurnell Lose, MD  phenytoin (DILANTIN) 300 MG ER capsule Take 1 capsule (300 mg total) by mouth at bedtime. Patient not taking: Reported on 10/21/2020 03/09/19   Thurnell Lose, MD  UNABLE TO FIND 1 each by Misc.(Non-Drug; Combo Route) route 2 times daily. 05/14/19   [provider]       Physical Exam: BP (!) 164/106 (BP Location: Right Arm)   Pulse 84   Temp 98.8 F (37.1 C) (Oral)   Resp 18   Ht 6' (1.829 m)   Wt 94 kg   SpO2 100%   BMI 28.11 kg/m  General appearance: Well-developed, adult male, alert and in no acute distress Eyes: Anicteric, conjunctiva pink, lids and lashes normal. PERRL.    ENT: No nasal deformity, discharge, epistaxis.  Hearing intact.  Neck: No neck masses.  Trachea midline.  No thyromegaly/tenderness. Lymph: No cervical or supraclavicular lymphadenopathy. Skin: Warm and dry.  No jaundice.  No suspicious rashes or lesions. Cardiac: RRR, nl  S1-S2, no murmurs appreciated.  No LE edema.  Radial and pedal pulses 2+ and symmetric. Respiratory: Normal respiratory rate and rhythm.  CTAB without rales or wheezes. Abdomen: Abdomen soft. Nontender. No ascites, distension, hepatosplenomegaly.   MSK: No deformities or effusions of the large joints of the upper or lower extremities bilaterally. Neuro: Cranial nerves 2 through 12 grossly intact.  Sensation intact to light  touch. Speech is fluent.  Muscle strength within normal limits.    Psych: Sensorium intact and responding to questions, pleasantly demented     Labs on Admission:  I have personally reviewed following labs and imaging studies: CT of the head pending CBC: Recent Labs  Lab 12/04/20 2121  WBC 3.4*  NEUTROABS 2.1  HGB 14.6  HCT 43.6  MCV 100.9*  PLT 184   Basic Metabolic Panel: Recent Labs  Lab 12/04/20 2121  NA 137  K 4.4  CL 104  CO2 24  GLUCOSE 69*  BUN 14  CREATININE 1.02  CALCIUM 9.1   GFR: Estimated Creatinine Clearance: 79.1 mL/min (by C-G formula based on SCr of 1.02 mg/dL).  Liver Function Tests: Recent Labs  Lab 12/04/20 2121  AST 16  ALT 15  ALKPHOS 93  BILITOT 1.0  PROT 6.9  ALBUMIN 3.8   No results for input(s): LIPASE, AMYLASE in the last 168 hours. No results for input(s): AMMONIA in the last 168 hours. Coagulation Profile: No results for input(s): INR, PROTIME in the last 168 hours. Cardiac Enzymes: No results for input(s): CKTOTAL, CKMB, CKMBINDEX, TROPONINI in the last 168 hours. BNP (last 3 results) No results for input(s): PROBNP in the last 8760 hours. HbA1C: No results for input(s): HGBA1C in the last 72 hours. CBG: Recent Labs  Lab 12/04/20 2120  GLUCAP 75   Lipid Profile: No results for input(s): CHOL, HDL, LDLCALC, TRIG, CHOLHDL, LDLDIRECT in the last 72 hours. Thyroid Function Tests: No results for input(s): TSH, T4TOTAL, FREET4, T3FREE, THYROIDAB in the last 72 hours. Anemia Panel: No results for input(s): VITAMINB12, FOLATE, FERRITIN, TIBC, IRON, RETICCTPCT in the last 72 hours.   No results found for this or any previous visit (from the past 240 hour(s)).         Radiological Exams on Admission: CT of the head pending No results found.  EKG: none    Assessment/Plan   1. Status epilepticus -20 minutes of tonic-clonic seizure -History of seizures -Subtherapeutic Dilantin level 3.8 -CT head  pending -Neurology consulted -Bolus of Dilantin given in ED -Further Dilantin dosage for neurology -Seizure precautions  2. Tobacco abuse -Continues to smoke -Recommend cessation -Patient refused nicotine patch   3. History of seizures -See #1 above   4.  Uncontrolled hypertension -Unsure what patient is on his home, awaiting med rec -Labetalol as needed for now   5.  History of vascular dementia -Appears at baseline -Fall precautions    DVT prophylaxis: Lovenox Code Status: Full Disposition Plan: Anticipate discharge home Consults called: Neurology (by ED) Admission status: Observation   At the point of initial evaluation, it is my clinical opinion that admission for OBSERVATION is reasonable and necessary because the patient's presenting complaints in the context of their chronic conditions represent sufficient risk of deterioration or significant morbidity to constitute reasonable grounds for close observation in the hospital setting, but that the patient may be medically stable for discharge from the hospital within 24 to 48 hours.    Medical decision making: Patient seen at 12:23 AM on 12/05/2020.  The patient was  discussed with ER provider.  What exists of the patient's chart was reviewed in depth and summarized above.  Clinical condition: Watcher.        Laqueta Due [facility] Please page though AMION or Epic secure chat:  For password, contact charge nurse

## 2020-12-05 NOTE — Progress Notes (Signed)
EEG complete - results pending 

## 2020-12-06 DIAGNOSIS — G40901 Epilepsy, unspecified, not intractable, with status epilepticus: Secondary | ICD-10-CM | POA: Diagnosis not present

## 2020-12-06 DIAGNOSIS — I10 Essential (primary) hypertension: Secondary | ICD-10-CM | POA: Diagnosis not present

## 2020-12-06 LAB — URINALYSIS, ROUTINE W REFLEX MICROSCOPIC
Bilirubin Urine: NEGATIVE
Glucose, UA: NEGATIVE mg/dL
Hgb urine dipstick: NEGATIVE
Ketones, ur: NEGATIVE mg/dL
Leukocytes,Ua: NEGATIVE
Nitrite: NEGATIVE
Protein, ur: NEGATIVE mg/dL
Specific Gravity, Urine: 1.009 (ref 1.005–1.030)
pH: 7 (ref 5.0–8.0)

## 2020-12-06 MED ORDER — AMLODIPINE BESYLATE 10 MG PO TABS
10.0000 mg | ORAL_TABLET | Freq: Every day | ORAL | Status: DC
  Administered 2020-12-06: 10 mg via ORAL
  Filled 2020-12-06: qty 1

## 2020-12-06 MED ORDER — SERTRALINE HCL 100 MG PO TABS
100.0000 mg | ORAL_TABLET | Freq: Every day | ORAL | Status: DC
  Administered 2020-12-06: 100 mg via ORAL
  Filled 2020-12-06: qty 1

## 2020-12-06 MED ORDER — MIRABEGRON ER 25 MG PO TB24
50.0000 mg | ORAL_TABLET | Freq: Every day | ORAL | Status: DC
  Administered 2020-12-06: 50 mg via ORAL
  Filled 2020-12-06: qty 2

## 2020-12-06 MED ORDER — PHENYTOIN SODIUM EXTENDED 200 MG PO CAPS: 400.0000 mg | ORAL_CAPSULE | Freq: Every day | ORAL | 1 refills | Status: AC

## 2020-12-06 MED ORDER — TAMSULOSIN HCL 0.4 MG PO CAPS
0.4000 mg | ORAL_CAPSULE | Freq: Every day | ORAL | Status: DC
  Administered 2020-12-06: 0.4 mg via ORAL
  Filled 2020-12-06: qty 1

## 2020-12-06 MED ORDER — MIRTAZAPINE 15 MG PO TABS
7.5000 mg | ORAL_TABLET | Freq: Every day | ORAL | Status: DC
  Administered 2020-12-06: 7.5 mg via ORAL
  Filled 2020-12-06: qty 1

## 2020-12-06 MED ORDER — ATORVASTATIN CALCIUM 40 MG PO TABS
40.0000 mg | ORAL_TABLET | Freq: Every day | ORAL | Status: DC
  Administered 2020-12-06: 40 mg via ORAL
  Filled 2020-12-06: qty 1

## 2020-12-06 MED ORDER — HYDRALAZINE HCL 25 MG PO TABS
25.0000 mg | ORAL_TABLET | Freq: Every day | ORAL | Status: DC
  Administered 2020-12-06: 25 mg via ORAL
  Filled 2020-12-06: qty 1

## 2020-12-06 MED ORDER — DONEPEZIL HCL 5 MG PO TABS
5.0000 mg | ORAL_TABLET | Freq: Every day | ORAL | Status: DC
  Administered 2020-12-06: 5 mg via ORAL
  Filled 2020-12-06: qty 1

## 2020-12-06 MED ORDER — LISINOPRIL 20 MG PO TABS
20.0000 mg | ORAL_TABLET | Freq: Every day | ORAL | Status: DC
  Administered 2020-12-06: 20 mg via ORAL
  Filled 2020-12-06: qty 1

## 2020-12-06 NOTE — Progress Notes (Signed)
Discharge instructions given to Art Buff, who is the caregiver/daughter via phone. Daughter verbalized understanding and all questions were answered.

## 2020-12-06 NOTE — Discharge Summary (Signed)
Physician Discharge Summary  Philip Robinson IRS:[id number] DOB: November 19, 1951 DOA: 12/04/2020  PCP: Hermelinda Dellen, NP  Admit date: 12/04/2020 Discharge date: 12/06/2020  Admitted From: Home Disposition:  Home  Recommendations for Outpatient Follow-up:  1. Follow up with PCP in 1-2 weeks 2. Please obtain BMP/CBC in one week 3. Please follow up on the following pending results:None  Home Health: No Equipment/Devices: None Discharge Condition: Stable CODE STATUS: Full Diet recommendation: Heart Healthy / Carb Modified   Brief/Interim Summary: Philip Robinson a 69 y.o.malewith hx of seizures, dementia, tobacco abuse, hypertension, non-insulin-dependent type 2 diabetes,who presentedon 1/1/2022with prolonged seizure. Her Dilantin levels were low so it is thought to be due to breakthrough seizures.  Neurology was consulted and he underwent another EEG which shows epileptogenic city involving right to left frontal, anterior temporal region with mild encephalopathic changes.  He was given extra dose of Dilantin, levels becomes therapeutic before discharge.  His home dose was increased from 300 mg to 400 mg daily.  He will need a repeat Dilantin level checked by Friday which can be done at primary care office or with his primary neurologist.  Patient is on multiple antihypertensive medications.  His blood pressure was controlled with some of them, not requiring all of them while in the hospital. There might be a compliance issue.  His PCP can address his compliance and monitor or titrate his medications.  He will continue current regimen and follow-up with his providers.  Discharge Diagnoses:  Active Problems:   Status epilepticus [facility])   Discharge Instructions  Discharge Instructions    Diet - low sodium heart healthy   Complete by: As directed    Discharge instructions   Complete by: As directed    It was pleasure taking care of you. Your neurologist increased the dose of  Dilantin to 400 mg daily, please take it as directed and follow-up with your neurologist and have your levels rechecked.  Your levels are within therapeutic range at this time. You need to have your levels checked in 4 to 5 days, preferably by Friday, your primary care provider or neurologist both can order that lab and monitor. Continue taking rest of your medications and follow-up with your primary care provider.   Increase activity slowly   Complete by: As directed      Allergies as of 12/06/2020   No Known Allergies     Medication List    TAKE these medications   amLODipine 10 MG tablet Commonly known as: NORVASC Take 10 mg by mouth daily with lunch.   atorvastatin 40 MG tablet Commonly known as: LIPITOR Take 40 mg by mouth daily with lunch.   donepezil 5 MG tablet Commonly known as: ARICEPT Take 5 mg by mouth daily with lunch.   hydrALAZINE 25 MG tablet Commonly known as: APRESOLINE Take 25 mg by mouth daily with lunch.   labetalol 100 MG tablet Commonly known as: NORMODYNE Take 0.5 tablets (50 mg total) by mouth 2 (two) times daily. What changed:   how much to take  when to take this   lisinopril 20 MG tablet Commonly known as: ZESTRIL Take 20 mg by mouth daily with lunch.   mirabegron ER 50 MG Tb24 tablet Commonly known as: MYRBETRIQ Take 50 mg by mouth daily with lunch.   mirtazapine 7.5 MG tablet Commonly known as: REMERON Take 7.5 mg by mouth daily with lunch.   phenytoin 200 MG ER capsule Commonly known as: DILANTIN Take 2 capsules (400 mg total) by  mouth daily. What changed:   medication strength  how much to take  when to take this   sertraline 100 MG tablet Commonly known as: ZOLOFT Take 100 mg by mouth daily with lunch.   tamsulosin 0.4 MG Caps capsule Commonly known as: FLOMAX Take 0.4 mg by mouth daily with lunch.       Follow-up Information    Dinges, Roanna Banning, NP. Schedule an appointment as soon as possible for a visit in 1  week(s).   Why: Patient needs to be seen by Friday and have his phenytoin levels checked Contact information: [address] [place] [address] [phone]              No Known Allergies  Consultations:  Neurology  Procedures/Studies: CT Head Wo Contrast  Result Date: 12/05/2020 CLINICAL DATA:  Seizure EXAM: CT HEAD WITHOUT CONTRAST TECHNIQUE: Contiguous axial images were obtained from the base of the skull through the vertex without intravenous contrast. COMPARISON:  October 21, 2020 FINDINGS: Brain: No evidence of acute territorial infarction, hemorrhage, hydrocephalus,extra-axial collection or mass lesion/mass effect. There is dilatation the ventricles and sulci consistent with age-related atrophy. Low-attenuation changes in the deep white matter consistent with small vessel ischemia. Vascular: No hyperdense vessel or unexpected calcification. Skull: The skull is intact. No fracture or focal lesion identified. Sinuses/Orbits: The visualized paranasal sinuses and mastoid air cells are clear. The orbits and globes intact. Other: None IMPRESSION: No acute intracranial abnormality. Findings consistent with age related atrophy and chronic small vessel ischemia Electronically Signed   By: Jonna Clark M.D.   On: 12/05/2020 00:43   EEG adult  Result Date: 12/05/2020 Charlsie Quest, MD     12/06/2020 10:01 AM Patient Name: Philip Robinson MRN: [id number] Epilepsy Attending: Charlsie Quest Referring Physician/Provider: Dr Marvel Plan Date: 12/05/2020 Duration: 24.07 mins Patient history: 70yo M presented with breakthrough seizure. EEG to evaluate for seizure Level of alertness: Awake, asleep AEDs during EEG study: PHT Technical aspects: This EEG study was done with scalp electrodes positioned according to the 10-20 International system of electrode placement. Electrical activity was acquired at a sampling rate of 500Hz  and reviewed with a high frequency filter of 70Hz  and a low frequency  filter of 1Hz . EEG data were recorded continuously and digitally stored. Description: The posterior dominant rhythm consists of 7 Hz activity of moderate voltage (25-35 uV) seen predominantly in posterior head regions, symmetric and reactive to eye opening and eye closing. Sleep was characterized by vertex waves, sleep spindles (12 to 14 Hz), maximal frontocentral region. Sharp waves were seen in right> left frontal-anterior temporal region, independently. Hyperventilation and photic stimulation were not performed.   ABNORMALITY - Sharp wave, right> left frontal-anterior temporal region - Background slow IMPRESSION: This study showed evidence of independent epileptogenicity arising from right> left frontal-anterior temporal region. There is also mild diffuse encephalopathy, non specific etiology. No seizures were seen throughout the recording. Priyanka     Subjective: Patient was feeling better when seen today.  There was some nursing concern of very dark and stinking urine.  Patient denies any complaints.  UA done and it was within normal limit.  Discharge Exam: Vitals:   12/06/20 0811 12/06/20 1110  BP: (!) 161/108 (!) 175/110  Pulse: 61 65  Resp: 16 16  Temp: (!) 97.5 F (36.4 C) 97.9 F (36.6 C)  SpO2: 99% 100%   Vitals:   12/06/20 0317 12/06/20 0811 12/06/20 0811 12/06/20 1110  BP: (!) 161/94 (!) 161/108 02/03/21)  161/108 (!) 175/110  Pulse: (!) 59 65 61 65  Resp: 20 16 16 16   Temp: 97.6 F (36.4 C) (!) 97.5 F (36.4 C) (!) 97.5 F (36.4 C) 97.9 F (36.6 C)  TempSrc: Oral Oral Oral Oral  SpO2: 99% 99% 99% 100%  Weight:      Height:        General: Pt is alert, awake, not in acute distress Cardiovascular: RRR, S1/S2 +, no rubs, no gallops Respiratory: CTA bilaterally, no wheezing, no rhonchi Abdominal: Soft, NT, ND, bowel sounds + Extremities: no edema, no cyanosis   The results of significant diagnostics from this hospitalization (including imaging, microbiology,  ancillary and laboratory) are listed below for reference.    Microbiology: Recent Results (from the past 240 hour(s))  SARS CORONAVIRUS 2 (TAT 6-24 HRS) Nasopharyngeal Nasopharyngeal Swab     Status: None   Collection Time: 12/05/20 12:50 AM   Specimen: Nasopharyngeal Swab  Result Value Ref Range Status   SARS Coronavirus 2 NEGATIVE NEGATIVE Final    Comment: (NOTE) SARS-CoV-2 target nucleic acids are NOT DETECTED.  The SARS-CoV-2 RNA is generally detectable in upper and lower respiratory specimens during the acute phase of infection. Negative results do not preclude SARS-CoV-2 infection, do not rule out co-infections with other pathogens, and should not be used as the sole basis for treatment or other patient management decisions. Negative results must be combined with clinical observations, patient history, and epidemiological information. The expected result is Negative.  Fact Sheet for Patients: SugarRoll.be  Fact Sheet for Healthcare Providers: https://www.woods-mathews.com/  This test is not yet approved or cleared by the [place] FDA and  has been authorized for detection and/or diagnosis of SARS-CoV-2 by FDA under an Emergency Use Authorization (EUA). This EUA will remain  in effect (meaning this test can be used) for the duration of the COVID-19 declaration under Se ction 564(b)(1) of the Act, 21 U.S.C. section 360bbb-3(b)(1), unless the authorization is terminated or revoked sooner.  Performed at [facility], [place] [address] [place], [place] [address]      Labs: BNP (last 3 results) Recent Labs    10/22/20 0058  BNP 604.5*   Basic Metabolic Panel: Recent Labs  Lab 12/04/20 2121 12/05/20 1208  NA 137 134*  K 4.4 4.1  CL 104 102  CO2 24 23  GLUCOSE 69* 130*  BUN 14 14  CREATININE 1.02 0.95  CALCIUM 9.1 8.9  MG  --  1.7   Liver Function Tests: Recent Labs  Lab 12/04/20 2121  AST 16  ALT 15   ALKPHOS 93  BILITOT 1.0  PROT 6.9  ALBUMIN 3.8   No results for input(s): LIPASE, AMYLASE in the last 168 hours. No results for input(s): AMMONIA in the last 168 hours. CBC: Recent Labs  Lab 12/04/20 2121 12/05/20 1208  WBC 3.4* 4.3  NEUTROABS 2.1 2.8  HGB 14.6 14.2  HCT 43.6 39.8  MCV 100.9* 95.9  PLT 184 180   Cardiac Enzymes: No results for input(s): CKTOTAL, CKMB, CKMBINDEX, TROPONINI in the last 168 hours. BNP: Invalid input(s): POCBNP CBG: Recent Labs  Lab 12/04/20 2120 12/05/20 0658  GLUCAP 75 78   D-Dimer No results for input(s): DDIMER in the last 72 hours. Hgb A1c No results for input(s): HGBA1C in the last 72 hours. Lipid Profile No results for input(s): CHOL, HDL, LDLCALC, TRIG, CHOLHDL, LDLDIRECT in the last 72 hours. Thyroid function studies No results for input(s): TSH, T4TOTAL, T3FREE, THYROIDAB in the last  72 hours.  Invalid input(s): FREET3 Anemia work up No results for input(s): VITAMINB12, FOLATE, FERRITIN, TIBC, IRON, RETICCTPCT in the last 72 hours. Urinalysis    Component Value Date/Time   COLORURINE YELLOW 12/06/2020 1038   APPEARANCEUR CLEAR 12/06/2020 1038   LABSPEC 1.009 12/06/2020 1038   PHURINE 7.0 12/06/2020 1038   GLUCOSEU NEGATIVE 12/06/2020 1038   HGBUR NEGATIVE 12/06/2020 1038   BILIRUBINUR NEGATIVE 12/06/2020 1038   KETONESUR NEGATIVE 12/06/2020 1038   PROTEINUR NEGATIVE 12/06/2020 1038   NITRITE NEGATIVE 12/06/2020 1038   LEUKOCYTESUR NEGATIVE 12/06/2020 1038   Sepsis Labs Invalid input(s): PROCALCITONIN,  WBC,  LACTICIDVEN Microbiology Recent Results (from the past 240 hour(s))  SARS CORONAVIRUS 2 (TAT 6-24 HRS) Nasopharyngeal Nasopharyngeal Swab     Status: None   Collection Time: 12/05/20 12:50 AM   Specimen: Nasopharyngeal Swab  Result Value Ref Range Status   SARS Coronavirus 2 NEGATIVE NEGATIVE Final    Comment: (NOTE) SARS-CoV-2 target nucleic acids are NOT DETECTED.  The SARS-CoV-2 RNA is generally  detectable in upper and lower respiratory specimens during the acute phase of infection. Negative results do not preclude SARS-CoV-2 infection, do not rule out co-infections with other pathogens, and should not be used as the sole basis for treatment or other patient management decisions. Negative results must be combined with clinical observations, patient history, and epidemiological information. The expected result is Negative.  Fact Sheet for Patients: HairSlick.no  Fact Sheet for Healthcare Providers: quierodirigir.com  This test is not yet approved or cleared by the [place] FDA and  has been authorized for detection and/or diagnosis of SARS-CoV-2 by FDA under an Emergency Use Authorization (EUA). This EUA will remain  in effect (meaning this test can be used) for the duration of the COVID-19 declaration under Se ction 564(b)(1) of the Act, 21 U.S.C. section 360bbb-3(b)(1), unless the authorization is terminated or revoked sooner.  Performed at [facility], [address] [place], [place] [address]     Time coordinating discharge: Over 30 minutes  SIGNED:  Arnetha Courser, MD  [facility] 12/06/2020, 2:36 PM  If 7PM-7AM, please contact night-coverage [email or web address]  This record has been created using Conservation officer, historic buildings. Errors have been sought and corrected,but may not always be located. Such creation errors do not reflect on the standard of care.

## 2020-12-06 NOTE — Progress Notes (Signed)
Subjective: No further seizures overnight.   ROS: negative except above  Examination  Vital signs in last 24 hours: Temp:  [97.5 F (36.4 C)-98.6 F (37 C)] 97.5 F (36.4 C) (01/03 0811) Pulse Rate:  [54-66] 61 (01/03 0811) Resp:  [16-20] 16 (01/03 0811) BP: (135-188)/(90-112) 161/108 (01/03 0811) SpO2:  [98 %-100 %] 99 % (01/03 0811)  General: lying in bed, not in apparent distress CVS: pulse-normal rate and rhythm RS: breathing comfortably Extremities: normal   Neuro: MS: Alert, oriented to self, place (hospital), follows commands CN: pupils equal and reactive,  EOMI, face symmetric, tongue midline, normal sensation over face, Motor: 5/5 strength in all 4 extremities  Basic Metabolic Panel: Recent Labs  Lab 12/04/20 2121 12/05/20 1208  NA 137 134*  K 4.4 4.1  CL 104 102  CO2 24 23  GLUCOSE 69* 130*  BUN 14 14  CREATININE 1.02 0.95  CALCIUM 9.1 8.9  MG  --  1.7    CBC: Recent Labs  Lab 12/04/20 2121 12/05/20 1208  WBC 3.4* 4.3  NEUTROABS 2.1 2.8  HGB 14.6 14.2  HCT 43.6 39.8  MCV 100.9* 95.9  PLT 184 180     Coagulation Studies: No results for input(s): LABPROT, INR in the last 72 hours.  Imaging CT head without contrast 12/05/2020: No acute abnormality.   ASSESSMENT AND PLAN: 70 year old male presented with breakthrough seizures in the setting of subtherapeutic Dilantin level.  Epilepsy with breakthrough seizures Subtherapeutic Dilantin level Dementia with behavioral disturbance -Reason for breakthrough seizures: Most likely due to subtherapeutic Dilantin level (3.8) no leukocytosis, normal electrolytes, denies any symptoms related to UTI. -Routine EEG showed right more than left frontal-anterior temporal epileptogenicity.  Recommendations -Phenytoin increased to 400 mg daily.  Repeat Dilantin level 16.7 -Recommend follow-up with patient's primary neurologist Dr. Royston Sinner at [facility] in 8 to 12 weeks. -Recommend checking phenytoin  trough level in about 5 days/Friday 12/10/2020.  This can be done at PCPs office or patient's primary neurologist office.  Please send the results to primary neurologist to adjust phenytoin dosing if needed for further breakthrough seizures or medication side effects. -Continue seizure precautions -Patient is okay for discharge from neurology standpoint.  I have spent a total of  25  minutes with the patient reviewing hospital notes,  test results, labs and examining the patient as well as establishing an assessment and plan that was discussed personally with the patient's daughter and Dr Nelson Chimes.  > 50% of time was spent in direct patient care.   Lindie Spruce Epilepsy [facility] For questions after 5pm please refer to AMION to reach the Neurologist on call

## 2020-12-07 LAB — URINE CULTURE: Culture: <10000 — AB

## 2020-12-10 ENCOUNTER — Emergency Department (HOSPITAL_COMMUNITY): Payer: Medicare Other

## 2020-12-10 ENCOUNTER — Inpatient Hospital Stay (HOSPITAL_COMMUNITY)
Admission: EM | Admit: 2020-12-10 | Discharge: 2020-12-20 | DRG: 036 | Disposition: A | Discharge status: Skilled Nursing Facility | Payer: Medicare Other | Attending: Internal Medicine | Admitting: Internal Medicine

## 2020-12-10 ENCOUNTER — Other Ambulatory Visit: Payer: Self-pay

## 2020-12-10 DIAGNOSIS — I639 Cerebral infarction, unspecified: Secondary | ICD-10-CM | POA: Diagnosis present

## 2020-12-10 DIAGNOSIS — I7121 Aneurysm of the ascending aorta, without rupture: Secondary | ICD-10-CM | POA: Diagnosis present

## 2020-12-10 DIAGNOSIS — E663 Overweight: Secondary | ICD-10-CM | POA: Diagnosis present

## 2020-12-10 DIAGNOSIS — I63231 Cerebral infarction due to unspecified occlusion or stenosis of right carotid arteries: Secondary | ICD-10-CM | POA: Diagnosis not present

## 2020-12-10 DIAGNOSIS — G4700 Insomnia, unspecified: Secondary | ICD-10-CM | POA: Diagnosis present

## 2020-12-10 DIAGNOSIS — I10 Essential (primary) hypertension: Secondary | ICD-10-CM | POA: Diagnosis present

## 2020-12-10 DIAGNOSIS — R400 Somnolence: Secondary | ICD-10-CM | POA: Diagnosis present

## 2020-12-10 DIAGNOSIS — I714 Abdominal aortic aneurysm, without rupture: Secondary | ICD-10-CM | POA: Diagnosis present

## 2020-12-10 DIAGNOSIS — E785 Hyperlipidemia, unspecified: Secondary | ICD-10-CM | POA: Diagnosis present

## 2020-12-10 DIAGNOSIS — Z8673 Personal history of transient ischemic attack (TIA), and cerebral infarction without residual deficits: Secondary | ICD-10-CM

## 2020-12-10 DIAGNOSIS — Z720 Tobacco use: Secondary | ICD-10-CM | POA: Diagnosis present

## 2020-12-10 DIAGNOSIS — G40909 Epilepsy, unspecified, not intractable, without status epilepticus: Secondary | ICD-10-CM | POA: Diagnosis present

## 2020-12-10 DIAGNOSIS — N529 Male erectile dysfunction, unspecified: Secondary | ICD-10-CM | POA: Diagnosis present

## 2020-12-10 DIAGNOSIS — F17200 Nicotine dependence, unspecified, uncomplicated: Secondary | ICD-10-CM | POA: Diagnosis present

## 2020-12-10 DIAGNOSIS — K5900 Constipation, unspecified: Secondary | ICD-10-CM | POA: Diagnosis present

## 2020-12-10 DIAGNOSIS — E119 Type 2 diabetes mellitus without complications: Secondary | ICD-10-CM

## 2020-12-10 DIAGNOSIS — R531 Weakness: Secondary | ICD-10-CM | POA: Diagnosis not present

## 2020-12-10 DIAGNOSIS — R159 Full incontinence of feces: Secondary | ICD-10-CM | POA: Diagnosis present

## 2020-12-10 DIAGNOSIS — R569 Unspecified convulsions: Secondary | ICD-10-CM

## 2020-12-10 DIAGNOSIS — Z20822 Contact with and (suspected) exposure to covid-19: Secondary | ICD-10-CM | POA: Diagnosis present

## 2020-12-10 DIAGNOSIS — Z6828 Body mass index (BMI) 28.0-28.9, adult: Secondary | ICD-10-CM

## 2020-12-10 DIAGNOSIS — F0150 Vascular dementia without behavioral disturbance: Secondary | ICD-10-CM | POA: Diagnosis present

## 2020-12-10 DIAGNOSIS — I712 Thoracic aortic aneurysm, without rupture: Secondary | ICD-10-CM | POA: Diagnosis present

## 2020-12-10 DIAGNOSIS — E1151 Type 2 diabetes mellitus with diabetic peripheral angiopathy without gangrene: Secondary | ICD-10-CM | POA: Diagnosis present

## 2020-12-10 DIAGNOSIS — Z79899 Other long term (current) drug therapy: Secondary | ICD-10-CM

## 2020-12-10 HISTORY — DX: Unspecified dementia, unspecified severity, without behavioral disturbance, psychotic disturbance, mood disturbance, and anxiety: F03.90

## 2020-12-10 HISTORY — DX: Epilepsy, unspecified, not intractable, without status epilepticus: G40.909

## 2020-12-10 LAB — URINALYSIS, COMPLETE (UACMP) WITH MICROSCOPIC
Bacteria, UA: NONE SEEN
Bilirubin Urine: NEGATIVE
Glucose, UA: NEGATIVE mg/dL
Hgb urine dipstick: NEGATIVE
Ketones, ur: NEGATIVE mg/dL
Leukocytes,Ua: NEGATIVE
Nitrite: NEGATIVE
Protein, ur: NEGATIVE mg/dL
Specific Gravity, Urine: 1.027 (ref 1.005–1.030)
pH: 5 (ref 5.0–8.0)

## 2020-12-10 LAB — CBC WITH DIFFERENTIAL/PLATELET
Abs Immature Granulocytes: 0.01 10*3/uL (ref 0.00–0.07)
Basophils Absolute: 0 10*3/uL (ref 0.0–0.1)
Basophils Relative: 1 %
Eosinophils Absolute: 0.2 10*3/uL (ref 0.0–0.5)
Eosinophils Relative: 3 %
HCT: 45.6 % (ref 39.0–52.0)
Hemoglobin: 14.9 g/dL (ref 13.0–17.0)
Immature Granulocytes: 0 %
Lymphocytes Relative: 23 %
Lymphs Abs: 1 10*3/uL (ref 0.7–4.0)
MCH: 33.3 pg (ref 26.0–34.0)
MCHC: 32.7 g/dL (ref 30.0–36.0)
MCV: 101.8 fL — ABNORMAL HIGH (ref 80.0–100.0)
Monocytes Absolute: 0.6 10*3/uL (ref 0.1–1.0)
Monocytes Relative: 14 %
Neutro Abs: 2.6 10*3/uL (ref 1.7–7.7)
Neutrophils Relative %: 59 %
Platelets: 206 10*3/uL (ref 150–400)
RBC: 4.48 MIL/uL (ref 4.22–5.81)
RDW: 12.1 % (ref 11.5–15.5)
WBC: 4.5 10*3/uL (ref 4.0–10.5)
nRBC: 0 % (ref 0.0–0.2)

## 2020-12-10 LAB — COMPREHENSIVE METABOLIC PANEL
ALT: 15 U/L (ref 0–44)
AST: 18 U/L (ref 15–41)
Albumin: 3.8 g/dL (ref 3.5–5.0)
Alkaline Phosphatase: 98 U/L (ref 38–126)
Anion gap: 12 (ref 5–15)
BUN: 18 mg/dL (ref 8–23)
CO2: 26 mmol/L (ref 22–32)
Calcium: 9.2 mg/dL (ref 8.9–10.3)
Chloride: 103 mmol/L (ref 98–111)
Creatinine, Ser: 1.06 mg/dL (ref 0.61–1.24)
GFR, Estimated: >60 mL/min (ref >60)
Glucose, Bld: 106 mg/dL — ABNORMAL HIGH (ref 70–99)
Potassium: 4.5 mmol/L (ref 3.5–5.1)
Sodium: 141 mmol/L (ref 135–145)
Total Bilirubin: 0.8 mg/dL (ref 0.3–1.2)
Total Protein: 7.5 g/dL (ref 6.5–8.1)

## 2020-12-10 LAB — RAPID URINE DRUG SCREEN, HOSP PERFORMED
Amphetamines: NOT DETECTED
Barbiturates: NOT DETECTED
Benzodiazepines: NOT DETECTED
Cocaine: NOT DETECTED
Opiates: NOT DETECTED
Tetrahydrocannabinol: NOT DETECTED

## 2020-12-10 LAB — CBG MONITORING, ED: Glucose-Capillary: 89 mg/dL (ref 70–99)

## 2020-12-10 LAB — AMMONIA: Ammonia: 30 umol/L (ref 9–35)

## 2020-12-10 NOTE — ED Triage Notes (Signed)
Pt bib [facility] from home. Pt was discharged a week ago after having seizure. Family states that since d/c pt has been generally weak, increased left leg weakness and "not himself". Today pt had episode of stool incontinence which family said was unusual for him. VSS per EMS.  Pt only alert to self on arrival which is baseline per EMS.

## 2020-12-10 NOTE — ED Notes (Signed)
Spoke with pt's daughter, Rinaldo Cloud per patient's okay.

## 2020-12-10 NOTE — ED Provider Notes (Signed)
[facility] EMERGENCY DEPARTMENT Provider Note   CSN: [id number] Arrival date & time: 12/10/20  1821     History Chief Complaint  Patient presents with  . Altered Mental Status    Philip Robinson is a 70 y.o. male with past medical history of seizures, dementia, tobacco abuse, hypertension, non-insulin-dependent type 2 diabetes that presents to the emergency department today for altered mental status.  Per chart review patient was recently discharged 4 days ago due to prolonged seizures due to breakthrough from low Dilantin levels.  Neurology was consulted at that time and he underwent EEG, was discharged home with extra dose of Dilantin.  Patient presents today from home, states that he has been generally weak with increased left leg weakness and not acting like himself per family.  Family States that left leg weakness has been occurring since he was discharged 4 days ago.  Per triage nurse family told EMS that he used to have left leg weakness due to his prior stroke, however that had resolved until he was discharged from the hospital.    Today patient had an episode of stool incontinence which family states is unusual for him.  Per EMS patient was only alert to himself.  On my exam patient is alert and oriented to himself, place, however unable to tell me why he is here.  Patient states that he takes care of himself at home.  Patient states that he did not have any incontinence today, unsure why he is here.  Unreliable HPI at this time due to patient being altered.  Tried to call family three times without success.  Patient is level 5 caveat at this time.  HPI     Past Medical History:  Diagnosis Date  . Diabetes mellitus without complication (HCC)   . Hypertension     Patient Active Problem List   Diagnosis Date Noted  . Status epilepticus (HCC) 12/05/2020  . Pain due to onychomycosis of toenails of both feet 12/10/2019  . Diabetes mellitus without complication (HCC)  12/10/2019  . Aortic dissection (HCC) 03/07/2019  . Ascending aortic aneurysm (HCC) 03/07/2019  . 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 Vascular dementia (HCC) 03/07/2019  . Nicotine abuse 03/07/2019  . CVA (cerebral vascular accident) (HCC) 03/07/2019  . Adjustment disorder with mixed anxiety and depressed mood 05/27/2015  . Essential hypertension, malignant   . Hypokalemia   . Seizure (HCC) 05/25/2015  . Acute encephalopathy 05/25/2015  . Delirium     No past surgical history on file.     Family History  Family history unknown: Yes    Social History   Tobacco Use  . Smoking status: Current Every Day Smoker  . Smokeless tobacco: Never Used  Substance Use Topics  . Alcohol use: No  . Drug use: No    Home Medications Prior to Admission medications   Medication Sig Start Date End Date Taking? Authorizing Provider  amLODipine (NORVASC) 10 MG tablet Take 10 mg by mouth daily with lunch. 11/17/20   [provider]  atorvastatin (LIPITOR) 40 MG tablet Take 40 mg by mouth daily with lunch. 11/17/20   [provider]  donepezil (ARICEPT) 5 MG tablet Take 5 mg by mouth daily with lunch. 11/17/20   [provider]  hydrALAZINE (APRESOLINE) 25 MG tablet Take 25 mg by mouth daily with lunch.    [provider]  labetalol (NORMODYNE) 100 MG tablet Take 0.5 tablets (50 mg total) by mouth 2 (two) times daily.  Patient taking differently: Take 100 mg by mouth daily with lunch. 03/09/19   Thurnell Lose, MD  lisinopril (ZESTRIL) 20 MG tablet Take 20 mg by mouth daily with lunch. 11/17/20   [provider]  mirabegron ER (MYRBETRIQ) 50 MG TB24 tablet Take 50 mg by mouth daily with lunch.    [provider]  mirtazapine (REMERON) 7.5 MG tablet Take 7.5 mg by mouth daily with lunch. 11/17/20   [provider]  phenytoin (DILANTIN) 200 MG ER capsule Take 2 capsules (400 mg total) by mouth daily. 12/06/20   Lorella Nimrod, MD   sertraline (ZOLOFT) 100 MG tablet Take 100 mg by mouth daily with lunch.    [provider]  tamsulosin (FLOMAX) 0.4 MG CAPS capsule Take 0.4 mg by mouth daily with lunch. 11/17/20   [provider]    Allergies    Patient has no known allergies.  Review of Systems   Review of Systems  Unable to perform ROS: Mental status change    Physical Exam Updated Vital Signs BP (!) 141/80   Pulse 65   Temp 97.6 F (36.4 C) (Oral)   Resp 18   Ht 6' (1.829 m)   Wt 94 kg   SpO2 97%   BMI 28.11 kg/m   Physical Exam Constitutional:      General: He is not in acute distress.    Appearance: Normal appearance. He is not ill-appearing, toxic-appearing or diaphoretic.     Comments: Patient's eyes are closed, however will open his eyes to follow commands.  No acute distress.  HENT:     Head: Normocephalic and atraumatic.     Mouth/Throat:     Mouth: Mucous membranes are moist.     Pharynx: Oropharynx is clear.  Eyes:     General: No scleral icterus.    Extraocular Movements: Extraocular movements intact.     Pupils: Pupils are equal, round, and reactive to light.  Cardiovascular:     Rate and Rhythm: Normal rate and regular rhythm.     Pulses: Normal pulses.     Heart sounds: Normal heart sounds.  Pulmonary:     Effort: Pulmonary effort is normal. No respiratory distress.     Breath sounds: Normal breath sounds. No stridor. No wheezing, rhonchi or rales.  Chest:     Chest wall: No tenderness.  Abdominal:     General: Abdomen is flat. There is no distension.     Palpations: Abdomen is soft.     Tenderness: There is no abdominal tenderness. There is no guarding or rebound.  Musculoskeletal:        General: No swelling or tenderness. Normal range of motion.     Cervical back: Normal range of motion and neck supple. No rigidity.     Right lower leg: No edema.     Left lower leg: No edema.  Skin:    General: Skin is warm and dry.     Capillary Refill: Capillary  refill takes less than 2 seconds.     Coloration: Skin is not pale.  Neurological:     General: No focal deficit present.     Mental Status: He is alert and oriented to person, place, and time.     Comments: Patient is alert and oriented to person and place and time, however is not alert to situation.  Patient states that he is unsure why he is here.  Will follow commands.  Patient with 4 out of 5 strength on  left side.  5 out of 5 strength on right side.  Patient with slight pronator drift on left side compared to right.  Normal sensation throughout.  Psychiatric:        Mood and Affect: Mood normal.        Behavior: Behavior normal.     ED Results / Procedures / Treatments   Labs (all labs ordered are listed, but only abnormal results are displayed) Labs Reviewed  COMPREHENSIVE METABOLIC PANEL - Abnormal; Notable for the following components:      Result Value   Glucose, Bld 106 (*)    All other components within normal limits  CBC WITH DIFFERENTIAL/PLATELET - Abnormal; Notable for the following components:   MCV 101.8 (*)    All other components within normal limits  URINALYSIS, COMPLETE (UACMP) WITH MICROSCOPIC  AMMONIA  RAPID URINE DRUG SCREEN, HOSP PERFORMED  PHENYTOIN LEVEL, FREE AND TOTAL  PHENYTOIN LEVEL, TOTAL  CBG MONITORING, ED    EKG None  Radiology CT HEAD WO CONTRAST  Result Date: 12/10/2020 CLINICAL DATA:  Delirium. Additional history provided: Seizure last week, generalized weakness, increased left leg weakness, episode of stool incontinence today. EXAM: CT HEAD WITHOUT CONTRAST TECHNIQUE: Contiguous axial images were obtained from the base of the skull through the vertex without intravenous contrast. COMPARISON:  Prior head CT examinations 12/05/2020 and earlier. Brain MRI 05/25/2015. FINDINGS: Brain: Mild cerebral and cerebellar atrophy. 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 Redemonstrated chronic lacunar infarcts within the  bilateral corona radiata/basal ganglia, thalami, pons and cerebellar hemispheres. Background advanced ill-defined hypoattenuation within the cerebral white matter is nonspecific, but compatible with chronic small vessel ischemic disease. Chronic punctate focus of calcification within the right pons (series 3, image 23). There is no acute intracranial hemorrhage. No acute demarcated cortical infarct. No extra-axial fluid collection. No evidence of intracranial mass. No midline shift. Vascular: No hyperdense vessel.  Atherosclerotic calcifications. Skull: Normal. Negative for fracture or focal lesion. Sinuses/Orbits: Visualized orbits show no acute finding. Small volume frothy secretions within a posterior left ethmoid air cell and within the left sphenoid sinus. Trace bilateral ethmoid sinus mucosal thickening. IMPRESSION: No evidence of acute intracranial abnormality. 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 Redemonstrated chronic lacunar infarcts within the bilateral corona radiata/basal ganglia, thalami, pons and cerebellar hemispheres. Stable background mild generalized atrophy of the brain and advanced cerebral white matter chronic small vessel ischemic disease. Mild paranasal sinus disease, most notably left ethmoid and sphenoid sinusitis. Electronically Signed   By: Jackey Loge DO   On: 12/10/2020 19:26    Procedures Procedures (including critical care time)  Medications Ordered in ED Medications - No data to display  ED Course  I have reviewed the triage vital signs and the nursing notes.  Pertinent labs & imaging results that were available during my care of the patient were reviewed by me and considered in my medical decision making (see chart for details).    MDM Rules/Calculators/A&P                         Philip Robinson is a 70 y.o. male with past medical history of seizures, dementia, tobacco abuse, hypertension, non-insulin-dependent type 2 diabetes  that presents to the emergency department today for altered mental status.  Unable to reach family at this time.  Patient does not appear to be in any acute distress, there is some minimal differences on the left side compared to the right side, will obtain CT  head and altered mental status work-up at this time.  Patient does not appear encephalopathic, is speaking to me in full sentences and is acting normally, I am unsure what patient's baseline is.  In regards to bowel incontinence, nursing states that last time he was here he had an episode of bowel incontinence.  At that time he did have a seizure.  Patient does not appear to be postictal, he states that he did not have a bad episode of bowel incontinence.  Hard to elicit proper history at this time, will obtain basic lab work and reevaluate.  Unsure if patient is altered or if this is patient's baseline.  Patient's work-up today benign, CMP and CBC without any abnormalities.  Ammonia normal.  CT head without any acute intracranial abnormality, does show previous multiple strokes including right sided occipital stroke.  Will speak to neurology at this time for further recommendation.  Spoke to Dr. Amada Jupiter, recommends MRI brain, if this is normal patient can most likely be discharged with follow-up.  Pt care was handed off to R. Browning PA-C at 12.  Complete history and physical and current plan have been communicated.  Please refer to their note for the remainder of ED care and ultimate disposition. Pending MRI, if normal and pt back to baseline pt to be discharged and follow up with neurology.   I discussed this case with my attending physician who cosigned this note including patient's presenting symptoms, physical exam, and planned diagnostics and interventions. Attending physician stated agreement with plan or made changes to plan which were implemented.   Attending physician assessed patient at bedside.  Final Clinical Impression(s) / ED  Diagnoses Final diagnoses:  Weakness    Rx / DC Orders ED Discharge Orders    None       Farrel Gordon, PA-C 12/10/20 2344    Cheryll Cockayne, MD 12/15/20 [phone]

## 2020-12-10 NOTE — ED Notes (Signed)
Pt returned from CT scan.

## 2020-12-10 NOTE — ED Notes (Signed)
Art Buff (Daughter) wants an update [phone]

## 2020-12-10 NOTE — Progress Notes (Signed)
Spoke with Crystal(RN). Explained due to patient not being A&Ox 4 we will need a chest xray and a KUB to check for implants/foreign bodies.

## 2020-12-11 ENCOUNTER — Inpatient Hospital Stay (HOSPITAL_COMMUNITY): Payer: Medicare Other

## 2020-12-11 ENCOUNTER — Encounter (HOSPITAL_COMMUNITY): Payer: Self-pay | Admitting: Radiology

## 2020-12-11 ENCOUNTER — Emergency Department (HOSPITAL_COMMUNITY): Payer: Medicare Other

## 2020-12-11 DIAGNOSIS — I6389 Other cerebral infarction: Secondary | ICD-10-CM | POA: Diagnosis not present

## 2020-12-11 DIAGNOSIS — Z720 Tobacco use: Secondary | ICD-10-CM

## 2020-12-11 DIAGNOSIS — G4700 Insomnia, unspecified: Secondary | ICD-10-CM | POA: Diagnosis present

## 2020-12-11 DIAGNOSIS — I63231 Cerebral infarction due to unspecified occlusion or stenosis of right carotid arteries: Secondary | ICD-10-CM | POA: Diagnosis present

## 2020-12-11 DIAGNOSIS — I639 Cerebral infarction, unspecified: Secondary | ICD-10-CM | POA: Diagnosis not present

## 2020-12-11 DIAGNOSIS — F17200 Nicotine dependence, unspecified, uncomplicated: Secondary | ICD-10-CM | POA: Diagnosis present

## 2020-12-11 DIAGNOSIS — E663 Overweight: Secondary | ICD-10-CM | POA: Diagnosis present

## 2020-12-11 DIAGNOSIS — I6521 Occlusion and stenosis of right carotid artery: Secondary | ICD-10-CM | POA: Diagnosis not present

## 2020-12-11 DIAGNOSIS — N529 Male erectile dysfunction, unspecified: Secondary | ICD-10-CM | POA: Diagnosis present

## 2020-12-11 DIAGNOSIS — Z6828 Body mass index (BMI) 28.0-28.9, adult: Secondary | ICD-10-CM | POA: Diagnosis not present

## 2020-12-11 DIAGNOSIS — Z79899 Other long term (current) drug therapy: Secondary | ICD-10-CM | POA: Diagnosis not present

## 2020-12-11 DIAGNOSIS — R569 Unspecified convulsions: Secondary | ICD-10-CM | POA: Diagnosis not present

## 2020-12-11 DIAGNOSIS — E119 Type 2 diabetes mellitus without complications: Secondary | ICD-10-CM | POA: Diagnosis not present

## 2020-12-11 DIAGNOSIS — I712 Thoracic aortic aneurysm, without rupture: Secondary | ICD-10-CM

## 2020-12-11 DIAGNOSIS — Z20822 Contact with and (suspected) exposure to covid-19: Secondary | ICD-10-CM | POA: Diagnosis present

## 2020-12-11 DIAGNOSIS — K5900 Constipation, unspecified: Secondary | ICD-10-CM | POA: Diagnosis present

## 2020-12-11 DIAGNOSIS — F0150 Vascular dementia without behavioral disturbance: Secondary | ICD-10-CM

## 2020-12-11 DIAGNOSIS — E1151 Type 2 diabetes mellitus with diabetic peripheral angiopathy without gangrene: Secondary | ICD-10-CM | POA: Diagnosis present

## 2020-12-11 DIAGNOSIS — G464 Cerebellar stroke syndrome: Secondary | ICD-10-CM

## 2020-12-11 DIAGNOSIS — Z8673 Personal history of transient ischemic attack (TIA), and cerebral infarction without residual deficits: Secondary | ICD-10-CM | POA: Diagnosis not present

## 2020-12-11 DIAGNOSIS — R531 Weakness: Secondary | ICD-10-CM | POA: Diagnosis present

## 2020-12-11 DIAGNOSIS — F0390 Unspecified dementia without behavioral disturbance: Secondary | ICD-10-CM | POA: Diagnosis not present

## 2020-12-11 DIAGNOSIS — R159 Full incontinence of feces: Secondary | ICD-10-CM | POA: Diagnosis present

## 2020-12-11 DIAGNOSIS — I714 Abdominal aortic aneurysm, without rupture: Secondary | ICD-10-CM | POA: Diagnosis present

## 2020-12-11 DIAGNOSIS — R400 Somnolence: Secondary | ICD-10-CM | POA: Diagnosis present

## 2020-12-11 DIAGNOSIS — I10 Essential (primary) hypertension: Secondary | ICD-10-CM

## 2020-12-11 DIAGNOSIS — E785 Hyperlipidemia, unspecified: Secondary | ICD-10-CM | POA: Diagnosis present

## 2020-12-11 DIAGNOSIS — G40909 Epilepsy, unspecified, not intractable, without status epilepticus: Secondary | ICD-10-CM | POA: Diagnosis present

## 2020-12-11 LAB — RESP PANEL BY RT-PCR (FLU A&B, COVID) ARPGX2
Influenza A by PCR: NEGATIVE
Influenza B by PCR: NEGATIVE
SARS Coronavirus 2 by RT PCR: NEGATIVE

## 2020-12-11 LAB — ECHOCARDIOGRAM COMPLETE
Area-P 1/2: 2.09 cm2
Height: 72 in
S' Lateral: 2.9 cm
Weight: 3315.72 oz

## 2020-12-11 LAB — GLUCOSE, CAPILLARY
Glucose-Capillary: 116 mg/dL — ABNORMAL HIGH (ref 70–99)
Glucose-Capillary: 118 mg/dL — ABNORMAL HIGH (ref 70–99)

## 2020-12-11 LAB — PHENYTOIN LEVEL, TOTAL: Phenytoin Lvl: 18 ug/mL (ref 10.0–20.0)

## 2020-12-11 MED ORDER — SODIUM CHLORIDE 0.9 % IV SOLN: Freq: Once | INTRAVENOUS | Status: AC

## 2020-12-11 MED ORDER — ATORVASTATIN CALCIUM 40 MG PO TABS
40.0000 mg | ORAL_TABLET | Freq: Every day | ORAL | Status: DC
  Administered 2020-12-11 – 2020-12-16 (×6): 40 mg via ORAL
  Filled 2020-12-11 (×6): qty 1

## 2020-12-11 MED ORDER — ACETAMINOPHEN 325 MG PO TABS
650.0000 mg | ORAL_TABLET | ORAL | Status: DC | PRN
  Administered 2020-12-15 – 2020-12-18 (×2): 650 mg via ORAL
  Filled 2020-12-11 (×2): qty 2

## 2020-12-11 MED ORDER — DONEPEZIL HCL 5 MG PO TABS
5.0000 mg | ORAL_TABLET | Freq: Every day | ORAL | Status: DC
  Administered 2020-12-12 – 2020-12-20 (×8): 5 mg via ORAL
  Filled 2020-12-11 (×8): qty 1

## 2020-12-11 MED ORDER — ASPIRIN 300 MG RE SUPP: 300.0000 mg | Freq: Every day | RECTAL | Status: DC

## 2020-12-11 MED ORDER — PHENYTOIN SODIUM EXTENDED 100 MG PO CAPS: 200.0000 mg | ORAL_CAPSULE | Freq: Two times a day (BID) | ORAL | Status: DC

## 2020-12-11 MED ORDER — PHENYTOIN SODIUM EXTENDED 100 MG PO CAPS
200.0000 mg | ORAL_CAPSULE | Freq: Two times a day (BID) | ORAL | Status: DC
Start: 2020-12-11 — End: 2020-12-21
  Administered 2020-12-11 – 2020-12-20 (×18): 200 mg via ORAL
  Filled 2020-12-11 (×18): qty 2

## 2020-12-11 MED ORDER — STROKE: EARLY STAGES OF RECOVERY BOOK
Freq: Once | Status: AC
  Filled 2020-12-11: qty 1

## 2020-12-11 MED ORDER — SODIUM CHLORIDE 0.9 % IV SOLN
150.0000 mg | Freq: Three times a day (TID) | INTRAVENOUS | Status: DC
  Administered 2020-12-11: 150 mg via INTRAVENOUS
  Filled 2020-12-11 (×3): qty 3

## 2020-12-11 MED ORDER — TAMSULOSIN HCL 0.4 MG PO CAPS
0.4000 mg | ORAL_CAPSULE | Freq: Every day | ORAL | Status: DC
  Administered 2020-12-12 – 2020-12-20 (×8): 0.4 mg via ORAL
  Filled 2020-12-11 (×8): qty 1

## 2020-12-11 MED ORDER — ENOXAPARIN SODIUM 40 MG/0.4ML ~~LOC~~ SOLN
40.0000 mg | SUBCUTANEOUS | Status: DC
  Administered 2020-12-11 – 2020-12-20 (×9): 40 mg via SUBCUTANEOUS
  Filled 2020-12-11 (×9): qty 0.4

## 2020-12-11 MED ORDER — MIRTAZAPINE 15 MG PO TABS
7.5000 mg | ORAL_TABLET | Freq: Every day | ORAL | Status: DC
  Administered 2020-12-12 – 2020-12-20 (×8): 7.5 mg via ORAL
  Filled 2020-12-11 (×9): qty 1

## 2020-12-11 MED ORDER — SERTRALINE HCL 100 MG PO TABS
100.0000 mg | ORAL_TABLET | Freq: Every day | ORAL | Status: DC
  Administered 2020-12-12 – 2020-12-20 (×8): 100 mg via ORAL
  Filled 2020-12-11 (×8): qty 1

## 2020-12-11 MED ORDER — ACETAMINOPHEN 650 MG RE SUPP: 650.0000 mg | RECTAL | Status: DC | PRN

## 2020-12-11 MED ORDER — MIRABEGRON ER 50 MG PO TB24
50.0000 mg | ORAL_TABLET | Freq: Every day | ORAL | Status: DC
  Administered 2020-12-12 – 2020-12-20 (×8): 50 mg via ORAL
  Filled 2020-12-11 (×9): qty 1

## 2020-12-11 MED ORDER — NICOTINE 14 MG/24HR TD PT24
14.0000 mg | MEDICATED_PATCH | Freq: Every day | TRANSDERMAL | Status: DC
  Administered 2020-12-11 – 2020-12-20 (×9): 14 mg via TRANSDERMAL
  Filled 2020-12-11 (×10): qty 1

## 2020-12-11 MED ORDER — IOHEXOL 350 MG/ML SOLN
75.0000 mL | Freq: Once | INTRAVENOUS | Status: AC | PRN
  Administered 2020-12-11: 75 mL via INTRAVENOUS

## 2020-12-11 MED ORDER — ASPIRIN 325 MG PO TABS
325.0000 mg | ORAL_TABLET | Freq: Every day | ORAL | Status: DC
  Administered 2020-12-11 – 2020-12-20 (×9): 325 mg via ORAL
  Filled 2020-12-11 (×9): qty 1

## 2020-12-11 MED ORDER — ACETAMINOPHEN 160 MG/5ML PO SOLN: 650.0000 mg | ORAL | Status: DC | PRN

## 2020-12-11 NOTE — H&P (Signed)
History and Physical    Philip MangoLavern Llamas ZOX:[id number] DOB: 05/08/1951 DOA: 12/10/2020  Referring MD/NP/PA: John GiovanniVasundhra Rathore, MD PCP: [name], [name], NP  Patient coming from: EMS  Chief Complaint: Confusion  I have personally briefly reviewed patient's old medical records in  Link   HPI: Philip Robinson is a 70 y.o. male with medical history significant of hypertension, diabetes mellitus type 2, seizure disorder, stroke aneurysm, and dementia who presents with worsening confusion and inability to stand. History is obtained from the patient's daughter as he has dementia at baseline. Patient just recently been hospitalized from 1/1-1/3 for prolonged seizures. His Dilantin dosage was increased from 300 mg daily to 400 mg daily. At baseline patient is able to recognize his daughter and knows that he lives with her. Usually ambulating with the use of a cane or a Rollator. However, since getting home from the hospital patient had been unable to get up and move around. She notes that he was basically sitting in a chair and was unable to care for self. He had been incontinent of his bowels, and was unable to recognize his daughter which was unusual. Patient reports that he is unsure why he is here in the hospital, states that he is 70 years old, the year is 2004, and that he lives [place] in [place].   ED Course: Upon admission into the emergency department patient was seen to be afebrile, pulse 55-75, respirations 11-22, blood pressure 124/103-170 3/83, and O2 saturations maintained.  CT did not note any acute abnormalities.  Labs were significant for Dilantin level of 18.  I was significant for small acute infarct scattered along the deep watershed territory in the right cerebral hemisphere.   Review of Systems  Unable to perform ROS: Dementia  Psychiatric/Behavioral: Positive for memory loss.    Past Medical History:  Diagnosis Date  . Dementia (HCC)   . Diabetes mellitus  without complication (HCC)   . Hypertension   . Seizure disorder [facility](HCC)     History reviewed. No pertinent surgical history.   reports that he has been smoking. He has never used smokeless tobacco. He reports that he does not drink alcohol and does not use drugs.  No Known Allergies  Family History  Family history unknown: Yes    Prior to Admission medications   Medication Sig Start Date End Date Taking? Authorizing Provider  amLODipine (NORVASC) 10 MG tablet Take 10 mg by mouth daily with lunch. 11/17/20  Yes [provider]  atorvastatin (LIPITOR) 40 MG tablet Take 40 mg by mouth at bedtime. 11/17/20  Yes [provider]  donepezil (ARICEPT) 5 MG tablet Take 5 mg by mouth daily with lunch. 11/17/20  Yes [provider]  hydrALAZINE (APRESOLINE) 25 MG tablet Take 25 mg by mouth at bedtime.   Yes [provider]  labetalol (NORMODYNE) 100 MG tablet Take 0.5 tablets (50 mg total) by mouth 2 (two) times daily. Patient taking differently: Take 100 mg by mouth daily with lunch. 03/09/19  Yes Leroy SeaSingh, Prashant K, MD  lisinopril (ZESTRIL) 20 MG tablet Take 20 mg by mouth daily with lunch. 11/17/20  Yes [provider]  mirabegron ER (MYRBETRIQ) 50 MG TB24 tablet Take 50 mg by mouth daily with lunch.   Yes [provider]  mirtazapine (REMERON) 7.5 MG tablet Take 7.5 mg by mouth daily with lunch. 11/17/20  Yes [provider]  phenytoin (DILANTIN) 200 MG ER capsule Take 2 capsules (400 mg total) by  mouth daily. 12/06/20  Yes Arnetha Courser, MD  sertraline (ZOLOFT) 100 MG tablet Take 100 mg by mouth daily with lunch.   Yes [provider]  tamsulosin (FLOMAX) 0.4 MG CAPS capsule Take 0.4 mg by mouth daily with lunch. 11/17/20  Yes [provider]    Physical Exam:  Constitutional: Elderly male who appears to be in no acute distress at this time Vitals:   12/11/20 0545 12/11/20 0600 12/11/20 0615 12/11/20 0630  BP:  (!) 149/102 (!) 157/103 (!) 156/95 (!) 173/83  Pulse: (!) 55 (!) 56  63  Resp: 18 16 15  (!) 22  Temp:      TempSrc:      SpO2: 100% 100%  98%  Weight:      Height:       Eyes: PERRL, lids and conjunctivae normal ENMT: Mucous membranes are moist. Posterior pharynx clear of any exudate or lesions. Poor dentition.  Neck: normal, supple, no masses, no thyromegaly Respiratory: clear to auscultation bilaterally, no wheezing, no crackles. Normal respiratory effort. No accessory muscle use.  Cardiovascular: Vertical, no murmurs / rubs / gallops. No extremity edema. 2+ pedal pulses. No carotid bruits.  Abdomen: no tenderness, no masses palpated. No hepatosplenomegaly. Bowel sounds positive.  Musculoskeletal: no clubbing / cyanosis. No joint deformity upper and lower extremities. Good ROM, no contractures. Normal muscle tone.  Skin: no rashes, lesions, ulcers. No induration Neurologic: CN 2-12 grossly intact. Sensation intact, DTR normal. Strength 5/5 in the right upper and lower extremity and 4+/5 in the left upper and lower extremity. Psychiatric: Poor memory. Alert and oriented x person. Normal mood.     Labs on Admission: I have personally reviewed following labs and imaging studies  CBC: Recent Labs  Lab 12/04/20 2121 12/05/20 1208 12/10/20 1937  WBC 3.4* 4.3 4.5  NEUTROABS 2.1 2.8 2.6  HGB 14.6 14.2 14.9  HCT 43.6 39.8 45.6  MCV 100.9* 95.9 101.8*  PLT 184 180 206   Basic Metabolic Panel: Recent Labs  Lab 12/04/20 2121 12/05/20 1208 12/10/20 1937  NA 137 134* 141  K 4.4 4.1 4.5  CL 104 102 103  CO2 24 23 26   GLUCOSE 69* 130* 106*  BUN 14 14 18   CREATININE 1.02 0.95 1.06  CALCIUM 9.1 8.9 9.2  MG  --  1.7  --    GFR: Estimated Creatinine Clearance: 76.1 mL/min (by C-G formula based on SCr of 1.06 mg/dL). Liver Function Tests: Recent Labs  Lab 12/04/20 2121 12/10/20 1937  AST 16 18  ALT 15 15  ALKPHOS 93 98  BILITOT 1.0 0.8  PROT 6.9 7.5  ALBUMIN 3.8 3.8    No results for input(s): LIPASE, AMYLASE in the last 168 hours. Recent Labs  Lab 12/10/20 1937  AMMONIA 30   Coagulation Profile: No results for input(s): INR, PROTIME in the last 168 hours. Cardiac Enzymes: No results for input(s): CKTOTAL, CKMB, CKMBINDEX, TROPONINI in the last 168 hours. BNP (last 3 results) No results for input(s): PROBNP in the last 8760 hours. HbA1C: No results for input(s): HGBA1C in the last 72 hours. CBG: Recent Labs  Lab 12/04/20 2120 12/05/20 0658 12/10/20 1948  GLUCAP 75 78 89   Lipid Profile: No results for input(s): CHOL, HDL, LDLCALC, TRIG, CHOLHDL, LDLDIRECT in the last 72 hours. Thyroid Function Tests: No results for input(s): TSH, T4TOTAL, FREET4, T3FREE, THYROIDAB in the last 72 hours. Anemia Panel: No results for input(s): VITAMINB12, FOLATE, FERRITIN, TIBC, IRON, RETICCTPCT in the last 72 hours. Urine  analysis:    Component Value Date/Time   COLORURINE YELLOW 12/10/2020 1843   APPEARANCEUR CLEAR 12/10/2020 1843   LABSPEC 1.027 12/10/2020 1843   PHURINE 5.0 12/10/2020 1843   GLUCOSEU NEGATIVE 12/10/2020 1843   HGBUR NEGATIVE 12/10/2020 1843   BILIRUBINUR NEGATIVE 12/10/2020 1843   KETONESUR NEGATIVE 12/10/2020 1843   PROTEINUR NEGATIVE 12/10/2020 1843   NITRITE NEGATIVE 12/10/2020 1843   LEUKOCYTESUR NEGATIVE 12/10/2020 1843   Sepsis Labs: Recent Results (from the past 240 hour(s))  SARS CORONAVIRUS 2 (TAT 6-24 HRS) Nasopharyngeal Nasopharyngeal Swab     Status: None   Collection Time: 12/05/20 12:50 AM   Specimen: Nasopharyngeal Swab  Result Value Ref Range Status   SARS Coronavirus 2 NEGATIVE NEGATIVE Final    Comment: (NOTE) SARS-CoV-2 target nucleic acids are NOT DETECTED.  The SARS-CoV-2 RNA is generally detectable in upper and lower respiratory specimens during the acute phase of infection. Negative results do not preclude SARS-CoV-2 infection, do not rule out co-infections with other pathogens, and should not be  used as the sole basis for treatment or other patient management decisions. Negative results must be combined with clinical observations, patient history, and epidemiological information. The expected result is Negative.  Fact Sheet for Patients: HairSlick.nohttps://www.fda.gov/media/138098/download  Fact Sheet for Healthcare Providers: quierodirigir.comhttps://www.fda.gov/media/138095/download  This test is not yet approved or cleared by the [place] FDA and  has been authorized for detection and/or diagnosis of SARS-CoV-2 by FDA under an Emergency Use Authorization (EUA). This EUA will remain  in effect (meaning this test can be used) for the duration of the COVID-19 declaration under Se ction 564(b)(1) of the Act, 21 U.S.C. section 360bbb-3(b)(1), unless the authorization is terminated or revoked sooner.  Performed at [facility], [address] [place], [place] [address]   Urine Culture     Status: Abnormal   Collection Time: 12/06/20 10:38 AM   Specimen: Urine, Clean Catch  Result Value Ref Range Status   Specimen Description URINE, CLEAN CATCH  Final   Special Requests NONE  Final   Culture (A)  Final    <10,000 COLONIES/mL INSIGNIFICANT GROWTH Performed at [facility], [address] [place], [place] [address]    Report Status 12/07/2020 FINAL  Final  Resp Panel by RT-PCR (Flu A&B, Covid) Nasopharyngeal Swab     Status: None   Collection Time: 12/11/20 12:04 AM   Specimen: Nasopharyngeal Swab; Nasopharyngeal(NP) swabs in vial transport medium  Result Value Ref Range Status   SARS Coronavirus 2 by RT PCR NEGATIVE NEGATIVE Final    Comment: (NOTE) SARS-CoV-2 target nucleic acids are NOT DETECTED.  The SARS-CoV-2 RNA is generally detectable in upper respiratory specimens during the acute phase of infection. The lowest concentration of SARS-CoV-2 viral copies this assay can detect is 138 copies/mL. A negative result does not preclude SARS-Cov-2 infection and should not be  used as the sole basis for treatment or other patient management decisions. A negative result may occur with  improper specimen collection/handling, submission of specimen other than nasopharyngeal swab, presence of viral mutation(s) within the areas targeted by this assay, and inadequate number of viral copies(<138 copies/mL). A negative result must be combined with clinical observations, patient history, and epidemiological information. The expected result is Negative.  Fact Sheet for Patients:  BloggerCourse.comhttps://www.fda.gov/media/152166/download  Fact Sheet for Healthcare Providers:  SeriousBroker.ithttps://www.fda.gov/media/152162/download  This test is no t yet approved or cleared by the [place] FDA and  has been authorized for detection and/or diagnosis of SARS-CoV-2  by FDA under an Emergency Use Authorization (EUA). This EUA will remain  in effect (meaning this test can be used) for the duration of the COVID-19 declaration under Section 564(b)(1) of the Act, 21 U.S.C.section 360bbb-3(b)(1), unless the authorization is terminated  or revoked sooner.       Influenza A by PCR NEGATIVE NEGATIVE Final   Influenza B by PCR NEGATIVE NEGATIVE Final    Comment: (NOTE) The Xpert Xpress SARS-CoV-2/FLU/RSV plus assay is intended as an aid in the diagnosis of influenza from Nasopharyngeal swab specimens and should not be used as a sole basis for treatment. Nasal washings and aspirates are unacceptable for Xpert Xpress SARS-CoV-2/FLU/RSV testing.  Fact Sheet for Patients: BloggerCourse.com  Fact Sheet for Healthcare Providers: SeriousBroker.it  This test is not yet approved or cleared by the [place] FDA and has been authorized for detection and/or diagnosis of SARS-CoV-2 by FDA under an Emergency Use Authorization (EUA). This EUA will remain in effect (meaning this test can be used) for the duration of the COVID-19 declaration under Section  564(b)(1) of the Act, 21 U.S.C. section 360bbb-3(b)(1), unless the authorization is terminated or revoked.  Performed at [facility], [address], [place], [place] [address]      Radiological Exams on Admission: CT ANGIO HEAD W OR WO CONTRAST  Result Date: 12/11/2020 CLINICAL DATA:  Seizure and acute stroke EXAM: CT ANGIOGRAPHY HEAD AND NECK TECHNIQUE: Multidetector CT imaging of the head and neck was performed using the standard protocol during bolus administration of intravenous contrast. Multiplanar CT image reconstructions and MIPs were obtained to evaluate the vascular anatomy. Carotid stenosis measurements (when applicable) are obtained utilizing NASCET criteria, using the distal internal carotid diameter as the denominator. CONTRAST:  75mL OMNIPAQUE IOHEXOL 350 MG/ML SOLN COMPARISON:  Brain MRI from earlier today FINDINGS: CT HEAD FINDINGS Brain: Small acute infarcts by MRI are not perceptible separate from extensive chronic small vessel ischemia in the cerebral white matter. Chronic lacunar infarcts in the left pons and deep gray nuclei. Chronic cortical infarct in the right occipital lobe. No acute hemorrhage, hydrocephalus, or masslike finding. Cerebral volume loss. Vascular: See below Skull: Negative Sinuses: Negative Orbits: Negative Review of the MIP images confirms the above findings CTA NECK FINDINGS Aortic arch: Atheromatous plaque.  4 cm diameter aortic arch. Right carotid system: Diffuse atheromatous wall thickening of the common carotid with mixed density plaque at the bifurcation. Predominately low-density plaque at the bulb creates a short segment string sign with patent channel only measuring 1 mm, at least 75% stenosis. No superimposed dissection or ulceration. Left carotid system: Atheromatous wall thickening of the common carotid with mixed density plaque at the bifurcation. No flow reducing stenosis, ulceration, or dissection. Vertebral arteries: Proximal subclavian  atherosclerosis without ulceration or dissection. High-grade narrowing of the dominant left vertebral origin. Occluded right V1 segment with reconstitution at the distal V1 level and thready flow and less luminal density by the dura. The right vertebral artery gradually increases in size. Skeleton: Spondylosis and degenerative disc narrowing. Posterior longitudinal ligament ossification at C4 to C6, likely contacting the ventral cord at C5. No acute finding. Other neck: Negative Upper chest: Biapical fibrosis. Review of the MIP images confirms the above findings CTA HEAD FINDINGS Anterior circulation: Carotid siphon heavy atheromatous calcification with prominent calcified plaque at the supraclinoid right ICA causing 80% stenosis by measurement. Atheromatous irregularity of MCA vessels diffusely. No proximal branch occlusion, beading, or aneurysm. Posterior circulation: Less intense flow in the right vertebral  artery in the setting of proximal occlusion and reconstitution. Atheromatous irregularity of the left V4 segment with mild to moderate distal narrowing. Diffusely patent basilar. PCA branches are atheromatous especially at the right P2 segment. Venous sinuses: Unremarkable in the arterial phase Anatomic variants: None significant Review of the MIP images confirms the above findings IMPRESSION: 1. Severe atheromatous stenosis at the right ICA bulb with short segment string sign. Second flow reducing stenosis in the supraclinoid right ICA. 2. 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 The right vertebral artery gradually reconstitutes but is less intensely enhancing by the level of the basilar. 3. 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 4. No intracranial hemorrhage. Known acute infarcts are not visible separate from the patient's extensive chronic ischemic injury. Electronically Signed   By: Marnee Spring M.D.   On: 12/11/2020 08:16   CT HEAD WO CONTRAST  Result  Date: 12/10/2020 CLINICAL DATA:  Delirium. Additional history provided: Seizure last week, generalized weakness, increased left leg weakness, episode of stool incontinence today. EXAM: CT HEAD WITHOUT CONTRAST TECHNIQUE: Contiguous axial images were obtained from the base of the skull through the vertex without intravenous contrast. COMPARISON:  Prior head CT examinations 12/05/2020 and earlier. Brain MRI 05/25/2015. FINDINGS: Brain: Mild cerebral and cerebellar atrophy. 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 Redemonstrated chronic lacunar infarcts within the bilateral corona radiata/basal ganglia, thalami, pons and cerebellar hemispheres. Background advanced ill-defined hypoattenuation within the cerebral white matter is nonspecific, but compatible with chronic small vessel ischemic disease. Chronic punctate focus of calcification within the right pons (series 3, image 23). There is no acute intracranial hemorrhage. No acute demarcated cortical infarct. No extra-axial fluid collection. No evidence of intracranial mass. No midline shift. Vascular: No hyperdense vessel.  Atherosclerotic calcifications. Skull: Normal. Negative for fracture or focal lesion. Sinuses/Orbits: Visualized orbits show no acute finding. Small volume frothy secretions within a posterior left ethmoid air cell and within the left sphenoid sinus. Trace bilateral ethmoid sinus mucosal thickening. IMPRESSION: No evidence of acute intracranial abnormality. 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 Redemonstrated chronic lacunar infarcts within the bilateral corona radiata/basal ganglia, thalami, pons and cerebellar hemispheres. Stable background mild generalized atrophy of the brain and advanced cerebral white matter chronic small vessel ischemic disease. Mild paranasal sinus disease, most notably left ethmoid and sphenoid sinusitis. Electronically Signed   By: Jackey Loge DO   On:  12/10/2020 19:26   CT ANGIO NECK W OR WO CONTRAST  Result Date: 12/11/2020 CLINICAL DATA:  Seizure and acute stroke EXAM: CT ANGIOGRAPHY HEAD AND NECK TECHNIQUE: Multidetector CT imaging of the head and neck was performed using the standard protocol during bolus administration of intravenous contrast. Multiplanar CT image reconstructions and MIPs were obtained to evaluate the vascular anatomy. Carotid stenosis measurements (when applicable) are obtained utilizing NASCET criteria, using the distal internal carotid diameter as the denominator. CONTRAST:  98mL OMNIPAQUE IOHEXOL 350 MG/ML SOLN COMPARISON:  Brain MRI from earlier today FINDINGS: CT HEAD FINDINGS Brain: Small acute infarcts by MRI are not perceptible separate from extensive chronic small vessel ischemia in the cerebral white matter. Chronic lacunar infarcts in the left pons and deep gray nuclei. Chronic cortical infarct in the right occipital lobe. No acute hemorrhage, hydrocephalus, or masslike finding. Cerebral volume loss. Vascular: See below Skull: Negative Sinuses: Negative Orbits: Negative Review of the MIP images confirms the above findings CTA NECK FINDINGS Aortic arch: Atheromatous plaque.  4 cm diameter aortic arch. Right carotid system: Diffuse  atheromatous wall thickening of the common carotid with mixed density plaque at the bifurcation. Predominately low-density plaque at the bulb creates a short segment string sign with patent channel only measuring 1 mm, at least 75% stenosis. No superimposed dissection or ulceration. Left carotid system: Atheromatous wall thickening of the common carotid with mixed density plaque at the bifurcation. No flow reducing stenosis, ulceration, or dissection. Vertebral arteries: Proximal subclavian atherosclerosis without ulceration or dissection. High-grade narrowing of the dominant left vertebral origin. Occluded right V1 segment with reconstitution at the distal V1 level and thready flow and less luminal  density by the dura. The right vertebral artery gradually increases in size. Skeleton: Spondylosis and degenerative disc narrowing. Posterior longitudinal ligament ossification at C4 to C6, likely contacting the ventral cord at C5. No acute finding. Other neck: Negative Upper chest: Biapical fibrosis. Review of the MIP images confirms the above findings CTA HEAD FINDINGS Anterior circulation: Carotid siphon heavy atheromatous calcification with prominent calcified plaque at the supraclinoid right ICA causing 80% stenosis by measurement. Atheromatous irregularity of MCA vessels diffusely. No proximal branch occlusion, beading, or aneurysm. Posterior circulation: Less intense flow in the right vertebral artery in the setting of proximal occlusion and reconstitution. Atheromatous irregularity of the left V4 segment with mild to moderate distal narrowing. Diffusely patent basilar. PCA branches are atheromatous especially at the right P2 segment. Venous sinuses: Unremarkable in the arterial phase Anatomic variants: None significant Review of the MIP images confirms the above findings IMPRESSION: 1. Severe atheromatous stenosis at the right ICA bulb with short segment string sign. Second flow reducing stenosis in the supraclinoid right ICA. 2. 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 The right vertebral artery gradually reconstitutes but is less intensely enhancing by the level of the basilar. 3. 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 4. No intracranial hemorrhage. Known acute infarcts are not visible separate from the patient's extensive chronic ischemic injury. Electronically Signed   By: Marnee Spring M.D.   On: 12/11/2020 08:16   MR Brain Wo Contrast (neuro protocol)  Result Date: 12/11/2020 CLINICAL DATA:  Delirium and left-sided weakness EXAM: MRI HEAD WITHOUT CONTRAST TECHNIQUE: Multiplanar, multiecho pulse sequences of the brain and surrounding structures were  obtained without intravenous contrast. COMPARISON:  Head CT from yesterday FINDINGS: Brain: Tiny acute infarcts scattered along the corona radiata and cortex of the right frontal to occipital lobes. Small acute infarct at the genu of the right internal capsule. There is a background of advanced chronic small vessel disease with extensive gliosis and chronic lacune is in the left pons and bilateral deep gray nuclei. Remote cortical infarct in the right occipital lobe. Small remote bilateral cerebellar infarctions. No mass, acute hemorrhage, obstructive hydrocephalus, or extra-axial collection. Vascular: Absent flow void in the right V4 segment just beyond the dura, but normalized more distally and potentially affected by motion artifact on this slice. Skull and upper cervical spine: Normal marrow signal Sinuses/Orbits: Unremarkable Other: Intermittent significant motion degradation. IMPRESSION: 1. Small acute infarcts scattered along the deep watershed territory of the right cerebral hemisphere. 2. Background of advanced chronic ischemic injury. 3. Motion degraded Electronically Signed   By: Marnee Spring M.D.   On: 12/11/2020 04:24   DG Abdomen Acute W/Chest  Result Date: 12/10/2020 CLINICAL DATA:  Check for metal.  MRI clearance. EXAM: DG ABDOMEN ACUTE WITH 1 VIEW CHEST COMPARISON:  X-ray abdomen 05/25/2015, chest x-ray 12/06/2017 FINDINGS: There is no evidence of dilated bowel loops or free intraperitoneal  air. No radiopaque calculi or other significant radiographic abnormality is seen. The heart size and mediastinal contours are unchanged. Aortic arch calcifications. Biapical pleural/pulmonary scarring. Nipple shadow overlies the left lower lobe. Similar appearing calcified granuloma at the right apex. No focal consolidation. No pulmonary edema. No pleural effusion. No pneumothorax. No acute osseous abnormality. Multilevel degenerative changes of the spine. No unexpected metallic density identified.   Overlying cardiac leads. IMPRESSION: Negative abdominal radiographs.  No acute cardiopulmonary disease. Electronically Signed   By: Tish Frederickson M.D.   On: 12/10/2020 23:59    EKG: Independently reviewed. Appears to be sinus rhythm at 57 bpm  Assessment/Plan Acute cerebrovascular accident: Patient was transferred to the hospital after being found to be acutely altered and more confused than baseline unable to recognize daughter with reports of increased weakness on his left side. MRI significant for small acute infarcts of the watershed area of the right cerebral hemisphere. Suspected likely secondary to right ICA stenosis seen on CTA. -Admit to telemetry bed -Stroke order set initiated -Neuro checks -Check Hemoglobin A1c and lipid panel in a.m. -PT/OT/Speech to eval and treat -Check echocardiogram -ASA -Appreciate neurology consultative services, will follow-up for any further recommendation  Right internal carotid artery stenosis: Acute. CTA significant for severe athermatous stenosis of the right ICA bulb with short segment string sign. -Vascular surgery consulted to discuss options with family  Seizure disorder: Patient had been previously recommended to take 400 mg daily.  Dilantin level noted to be 18 on admission.  Neurology recommended splitting up dose to 200 mg twice daily to decrease risk of peaks and valleys. -Seizure precautions -Dilantin 200 mg twice daily p.o., but to utilize 150 mg IV 3 times daily until able to pass swallow evaluation   Essential hypertension: Blood pressures initially noted elevated up to 173/83. Home blood pressure medications include amlodipine 10 mg daily, hydralazine 25 mg nightly, lisinopril 20 mg daily, and labetalol 100 mg with lunch. -Gradually normalized blood pressures over the next 2 to 3 days per neurology  Vascular dementia: At baseline patient previously had been able to recognize his daughter and knew where he lived. Home medications  include Aricept 5 mg daily. -Continue Aricept  Hyperlipidemia: Home regimen includes atorvastatin 40 mg  Daily. -Follow-up lipid panel -Continue statin  Diabetes mellitus type 2, controlled: Patient is diet controlled. Last hemoglobin A1c noted to be 6.1 on 03/08/2019. -Follow-up hemoglobin A1c  Tobacco abuse: Patient reportedly continues to smoke. -Continue nicotine  Ascending aortic pain: CT angiogram of the neck showed dilation of the arch to 4 cm. -Recommend outpatient follow-up and annual surveillance  DVT prophylaxis: lovenox Code Status: Full Family Communication: Discussed plan of care with patient daughter over the phone Disposition Plan: To be determined Consults called: Neurology and vascular surgery Admission status: Inpatient, require more than 2 midnight due to acute stroke  Clydie Braun MD [facility]   If 7PM-7AM, please contact night-coverage   12/11/2020, 8:20 AM

## 2020-12-11 NOTE — Consult Note (Signed)
Neurology Consultation Reason for Consult: Stroke Referring Physician: Dahlia Client, R  CC: He complains of erectile dysfunction in fairly colorful language  History is obtained from: Chart review  HPI: Philip Robinson is a 70 y.o. male with a history of diabetes, hypertension, seizure disorder, dementia who was in his normal state until he had a breakthrough seizure.  At home he has apparently been confused, and there have been complaints of some left leg weakness.  Due to the symptoms, he was brought in to the emergency department.  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   LKW: Unclear, likely prior to 1/2 tpa given?: no, outside of window   ROS: A 14 point ROS was performed and is negative except as noted in the HPI.  Past Medical History:  Diagnosis Date  . Diabetes mellitus without complication (HCC)   . Hypertension      Family History  Family history unknown: Yes     Social History:  reports that he has been smoking. He has never used smokeless tobacco. He reports that he does not drink alcohol and does not use drugs.   Exam: Current vital signs: BP (!) 173/83 (BP Location: Right Arm)   Pulse 63   Temp (!) 97.5 F (36.4 C) (Oral)   Resp (!) 22   Ht 6' (1.829 m)   Wt 94 kg   SpO2 98%   BMI 28.11 kg/m  Vital signs in last 24 hours: Temp:  [97.5 F (36.4 C)-98.6 F (37 C)] 97.5 F (36.4 C) (01/08 0542) Pulse Rate:  [55-75] 63 (01/08 0630) Resp:  [11-22] 22 (01/08 0630) BP: (124-173)/(64-103) 173/83 (01/08 0630) SpO2:  [97 %-100 %] 98 % (01/08 0630) Weight:  [94 kg] 94 kg (01/07 1830)   Physical Exam  Constitutional: Appears well-developed and well-nourished.  Psych: Affect appropriate to situation Eyes: No scleral injection HENT: No OP obstrucion MSK: no joint deformities.  Cardiovascular: Normal rate and regular rhythm.  Respiratory: Effort normal, non-labored breathing GI: Soft.  No distension.  There is no tenderness.  Skin: WDI  Neuro: Mental Status: Patient is awake, alert, oriented to person, hospital, but cannot give me the month or year Cranial Nerves: II: Visual Fields are full. Pupils are equal, round, and reactive to light.   III,IV, VI: EOMI without ptosis or diploplia.  V: Facial sensation is symmetric to temperature VII: Facial movement is symmetric.  VIII: hearing is intact to voice X: Uvula elevates symmetrically XI: Shoulder shrug is symmetric. XII: tongue is midline without atrophy or fasciculations.  Motor: Tone is normal. Bulk is normal. 5/5 strength was present on the right, he has 4+/5 elbow extension on the left, seems fairly symmetric to confrontation in the left leg Sensory: Sensation is symmetric to light touch and temperature in the arms and legs.  He does not extinguish Cerebellar: No clear ataxia   I have reviewed labs in epic and the results pertinent to this consultation are: Phenytoin 18  I have reviewed the images obtained: MRI brain-watershed appearing stroke on the right  Impression: 70 year old male with increased confusion since an episode of status epilepticus on the first.  His MRI does reveal watershed territory infarction and he will need to be worked up for this given the asymmetry.  I am concerned that he may have some proximal stenosis.   Also, he is supposed to get his Dilantin at night, and therefore a level of 18 at midnight after having missed  a dose does make me worried that he may have been getting slightly high and this could be contributing.  Once up to 400 mg of Dilantin, I favor twice daily dosing to try to avoid peaks and valleys.  Recommendations: - HgbA1c, fasting lipid panel - Frequent neuro checks - Echocardiogram -CTA head neck - Prophylactic therapy-Antiplatelet med: Aspirin - dose 325mg  PO or 300mg  PR - Risk factor modification - Telemetry monitoring - PT consult, OT consult, Speech consult -Dilantin 200 mg  twice daily - Stroke team to follow   , MD [facility] [phone]  If 7pm- 7am, please page neurology on call as listed in AMION.

## 2020-12-11 NOTE — ED Notes (Addendum)
Rinaldo Cloud (daughter) [phone]   Pt daughter was updated on the patient status

## 2020-12-11 NOTE — Consult Note (Signed)
Hospital Consult    Reason for Consult:  Symptomatic high grade right ICA stenosis Referring Physician:  Hospitalist MRN #:  [id number]  History of Present Illness: This is a 70 y.o. male with history of diabetes, hypertension, advanced dementia, seizure disorder that vascular surgery has been consulted for symptomatic high-grade right ICA stenosis.  Patient is a very poor historian and given his dementia cannot tell me why he is in the ED.  In review of the notes appears had a breakthrough seizure but then had some left leg weakness and then was brought to the emergency department.  MRI showed watershed infarcts in the right hemisphere.  CTA neck showed severe stenosis in the right ICA bulb with string sign also with an occluded right vert origin and severe left vertebral artery narrowing.  I spoke to the daughter on the phone who states she took him home from the hospital about a week ago when he had a breakthrough seizure and she noticed immediately left sided weakness for the last week which she has been watching at home.  She denies any known hx of neck surgery or neck radiation.  Past Medical History:  Diagnosis Date  . Diabetes mellitus without complication (HCC)   . Hypertension     No past surgical history on file.  No Known Allergies  Prior to Admission medications   Medication Sig Start Date End Date Taking? Authorizing Provider  amLODipine (NORVASC) 10 MG tablet Take 10 mg by mouth daily with lunch. 11/17/20  Yes [provider]  atorvastatin (LIPITOR) 40 MG tablet Take 40 mg by mouth at bedtime. 11/17/20  Yes [provider]  donepezil (ARICEPT) 5 MG tablet Take 5 mg by mouth daily with lunch. 11/17/20  Yes [provider]  hydrALAZINE (APRESOLINE) 25 MG tablet Take 25 mg by mouth at bedtime.   Yes [provider]  labetalol (NORMODYNE) 100 MG tablet Take 0.5 tablets (50 mg total) by mouth 2 (two) times daily. Patient taking differently:  Take 100 mg by mouth daily with lunch. 03/09/19  Yes Leroy Sea, MD  lisinopril (ZESTRIL) 20 MG tablet Take 20 mg by mouth daily with lunch. 11/17/20  Yes [provider]  mirabegron ER (MYRBETRIQ) 50 MG TB24 tablet Take 50 mg by mouth daily with lunch.   Yes [provider]  mirtazapine (REMERON) 7.5 MG tablet Take 7.5 mg by mouth daily with lunch. 11/17/20  Yes [provider]  phenytoin (DILANTIN) 200 MG ER capsule Take 2 capsules (400 mg total) by mouth daily. 12/06/20  Yes Arnetha Courser, MD  sertraline (ZOLOFT) 100 MG tablet Take 100 mg by mouth daily with lunch.   Yes [provider]  tamsulosin (FLOMAX) 0.4 MG CAPS capsule Take 0.4 mg by mouth daily with lunch. 11/17/20  Yes [provider]    Social History   Socioeconomic History  . Marital status: Divorced    Spouse name: Not on file  . Number of children: Not on file  . Years of education: Not on file  . Highest education level: Not on file  Occupational History  . Not on file  Tobacco Use  . Smoking status: Current Every Day Smoker  . Smokeless tobacco: Never Used  Substance and Sexual Activity  . Alcohol use: No  . Drug use: No  . Sexual activity: Never  Other Topics Concern  . Not on file  Social History Narrative  . Not on file   Social Determinants of Health  Financial Resource Strain: Not on file  Food Insecurity: Not on file  Transportation Needs: Not on file  Physical Activity: Not on file  Stress: Not on file  Social Connections: Not on file  Intimate Partner Violence: Not on file     Family History  Family history unknown: Yes    ROS: [x]  Positive   [ ]  Negative   [ ]  All sytems reviewed and are negative  Cardiovascular: []  chest pain/pressure []  palpitations []  SOB lying flat []  DOE []  pain in legs while walking []  pain in legs at rest []  pain in legs at night []  non-healing ulcers []  hx of DVT []  swelling in legs  Pulmonary: []   productive cough []  asthma/wheezing []  home O2  Neurologic: []  weakness in []  arms []  legs []  numbness in []  arms []  legs []  hx of CVA []  mini stroke [] difficulty speaking or slurred speech []  temporary loss of vision in one eye []  dizziness  Hematologic: []  hx of cancer []  bleeding problems []  problems with blood clotting easily  Endocrine:   []  diabetes []  thyroid disease  GI []  vomiting blood []  blood in stool  GU: []  CKD/renal failure []  HD--[]  M/W/F or []  T/T/S []  burning with urination []  blood in urine  Psychiatric: []  anxiety []  depression  Musculoskeletal: []  arthritis []  joint pain  Integumentary: []  rashes []  ulcers  Constitutional: []  fever []  chills   Physical Examination  Vitals:   12/11/20 1430 12/11/20 1445  BP: (!) 181/90 (!) 156/107  Pulse: (!) 54 (!) 50  Resp: 12   Temp:    SpO2: 100% 100%   Body mass index is 28.11 kg/m.  General:  NAD Gait: Not observed HENT: WNL, normocephalic Pulmonary: normal non-labored breathing, no respiratory distress Cardiac: regular, without  Murmurs, rubs or gallops Abdomen: soft, NT/ND Skin: without rashes Extremities: without ischemic changes Musculoskeletal: no muscle wasting or atrophy  Neurologic: A&O X 3; Neuro exam appears intact, except 4+/5 on the left   CBC    Component Value Date/Time   WBC 4.5 12/10/2020 1937   RBC 4.48 12/10/2020 1937   HGB 14.9 12/10/2020 1937   HCT 45.6 12/10/2020 1937   PLT 206 12/10/2020 1937   MCV 101.8 (H) 12/10/2020 1937   MCH 33.3 12/10/2020 1937   MCHC 32.7 12/10/2020 1937   RDW 12.1 12/10/2020 1937   LYMPHSABS 1.0 12/10/2020 1937   MONOABS 0.6 12/10/2020 1937   EOSABS 0.2 12/10/2020 1937   BASOSABS 0.0 12/10/2020 1937    BMET    Component Value Date/Time   NA 141 12/10/2020 1937   K 4.5 12/10/2020 1937   CL 103 12/10/2020 1937   CO2 26 12/10/2020 1937   GLUCOSE 106 (H) 12/10/2020 1937   BUN 18 12/10/2020 1937   CREATININE 1.06  12/10/2020 1937   CALCIUM 9.2 12/10/2020 1937   GFRNONAA >60 12/10/2020 1937   GFRAA >60 07/29/2020 1535    COAGS: No results found for: INR, PROTIME   Non-Invasive Vascular Imaging:    CTA neck today on my review shows high-grade right ICA stenosis proximal with soft plaque and this is the same side as his right hemispheric event.  There is a second ICA stenosis in the supraclinoid right ICA.  ASSESSMENT/PLAN: This is a 70 y.o. male that presents with likely symptomatic high-grade right ICA stenosis in the setting of right hemispheric event.  I will allow him to complete his stroke work-up and get complete input from neurology prior to making  a decision about proceeding with surgery.  I have discussed with his daughter given his advanced dementia and she feels like she would still want to proceed with surgery to give him the best chance of living independently with his dementia.  We discussed this would not improve his dementia would only be for stroke risk reduction moving forward.  Risks and benefits were discussed in detail.  Vascular surgery will continue to follow.  Cephus Shelling, MD [facility] Office: [phone]  Cephus Shelling

## 2020-12-11 NOTE — ED Notes (Signed)
Patient brief changed and linen changed

## 2020-12-11 NOTE — Progress Notes (Signed)
  Echocardiogram 2D Echocardiogram has been performed.  Philip Robinson G Hazle Ogburn 12/11/2020, 3:03 PM

## 2020-12-11 NOTE — ED Provider Notes (Signed)
Signed out to me at shift change.  New strokes seen on MRI.  Discussed with Dr. Amada Jupiter, who will consult.  Medicine to admit.   Alert and oriented, no complaints at this time.  Appreciate Dr. Loney Loh for admitting.   Roxy Horseman, PA-C 12/11/20 2831    Dione Booze, MD 12/11/20 [phone]

## 2020-12-11 NOTE — Progress Notes (Addendum)
STROKE TEAM PROGRESS NOTE  HPI per record: Philip Robinson is a 70 y.o. male with past medical history of seizures, dementia, tobacco abuse, hypertension, non-insulin-dependent type 2 diabetes that presents to the emergency department today for altered mental status.  Per chart review patient was recently discharged 4 days ago due to prolonged seizures due to breakthrough from low Dilantin levels.  Neurology was consulted at that time and he underwent EEG, was discharged home with extra dose of Dilantin.  Patient presents today from home, states that he has been generally weak with increased left leg weakness and not acting like himself per family.  Family States that left leg weakness has been occurring since he was discharged 4 days ago.  Per triage nurse family told EMS that he used to have left leg weakness due to his prior stroke, however that had resolved until he was discharged from the hospital.    Today patient had an episode of stool incontinence which family states is unusual for him.  Per EMS patient was only alert to himself.  On my exam patient is alert and oriented to himself, place, however unable to tell me why he is here.  Patient states that he takes care of himself at home.  Patient states that he did not have any incontinence today, unsure why he is here.  Unreliable HPI at this time due to patient being altered.  Tried to call family three times without success.  Patient is level 5 caveat at this time.  INTERVAL HISTORY Patient evaluated at bedside this morning.  Lying in bed somnolent, disoriented and confused. States he is at his home.  Follows some midline one-step commands.  No information on swallowing test yet, asked bedside nurse to repeat it.  MRI shows watershed territory infarction.  Discussed with Dr. Katrinka Blazing that if patient passes his swallow  test Plavix should be added along with continuing aspirin.  If patient fails swallowing test, continue aspirin rectally and Dilantin needs  to be changed to IV form as would not like to hold his antiseizure medication.  Also discussed, that due to severe stenosis in right ICA and right vertebral and left vertebral arteries, vascular surgery should be consulted. most recent Dilantin level 18.  EEG has been ordered.  Blood pressure well controlled.  See below for details of neurological exam.  Vitals:   12/11/20 1245 12/11/20 1315 12/11/20 1330 12/11/20 1345  BP: (!) 151/76 (!) 142/107 (!) 140/94 129/90  Pulse: (!) 51 (!) 56 (!) 56 (!) 51  Resp: 16   16  Temp:      TempSrc:      SpO2: 99% 100% 100% 100%  Weight:      Height:       CBC:  Recent Labs  Lab 12/05/20 1208 12/10/20 1937  WBC 4.3 4.5  NEUTROABS 2.8 2.6  HGB 14.2 14.9  HCT 39.8 45.6  MCV 95.9 101.8*  PLT 180 206   Basic Metabolic Panel:  Recent Labs  Lab 12/05/20 1208 12/10/20 1937  NA 134* 141  K 4.1 4.5  CL 102 103  CO2 23 26  GLUCOSE 130* 106*  BUN 14 18  CREATININE 0.95 1.06  CALCIUM 8.9 9.2  MG 1.7  --    Urine Drug Screen:  Recent Labs  Lab 12/10/20 1843  LABOPIA NONE DETECTED  COCAINSCRNUR NONE DETECTED  LABBENZ NONE DETECTED  AMPHETMU NONE DETECTED  THCU NONE DETECTED  LABBARB NONE DETECTED     IMAGING past 24 hours  CT Head wo contrast 12/11/2019 IMPRESSION: No evidence of acute intracranial abnormality.  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  Redemonstrated chronic lacunar infarcts within the bilateral corona radiata/basal ganglia, thalami, pons and cerebellar hemispheres.  Stable background mild generalized atrophy of the brain and advanced cerebral white matter chronic small vessel ischemic disease.  Mild paranasal sinus disease, most notably left ethmoid and sphenoid Sinusitis.  MR Brain wo Contrast 12/12/2019 IMPRESSION: 1. Small acute infarcts scattered along the deep watershed territory of the right cerebral hemisphere. 2. Background of advanced chronic ischemic  injury. 3. Motion degraded  CTA head and Neck: 12/12/2019   IMPRESSION: 1. Severe atheromatous stenosis at the right ICA bulb with short segment string sign. Second flow reducing stenosis in the supraclinoid right ICA. 2. 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 The right vertebral artery gradually reconstitutes but is less intensely enhancing by the level of the basilar. 3. 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 4. No intracranial hemorrhage. Known acute infarcts are not visible separate from the patient's extensive chronic ischemic injury.    PHYSICAL EXAM General: Well developed, well nourished elderly male lying in bed, NAD. HEENT: [place]/AT, no scleral injection. Cardiovascular: RRR, S1, S2 heard.  No R/M/G. Respiratory: Clear to auscultation bilaterally, normal respiratory effort Neurological:  Mental Status: Somnolent, confused, disoriented.  Speech limited but fluent without evidence of aphasia.  Able to follow some 1step commands. Cranial Nerves: II:  Visual fields grossly normal, pupils equal, round, reactive to light and accommodation III,IV, VI: ptosis not present, extra-ocular motions intact bilaterally V,VII: Face symmetric VIII: hearing normal bilaterally XII: midline tongue extension without atrophy or fasciculations  Motor: Right : Upper extremity   5/5    Left:     Upper extremity   4/5  Lower extremity   5/5     Lower extremity   4/5 Tone and bulk:normal tone throughout; no atrophy noted Sensory: Noncooperative patient.  Plantars: Right: downgoing   Left: downgoing Cerebellar: Unable to follow commands Gait: Deferred   ASSESSMENT/PLAN [name] is a 70 y.o. male with PMHx  of seizures, dementia, tobacco abuse, hypertension, non-insulin-dependent type 2 diabetes that presents to the emergency department today for altered mental status. MRI brain shows small acute infarcts scattered along the deep  watershed territory of the right cerebral hemisphere. Did not receive tPA as outside of window.  Stroke:  acute infarcts scattered along the deep watershed territory of the right cerebral hemisphere possibly due to large vessel source of right ICA high grade stenosis  CT head No evidence of acute intracranial abnormality.  CTA head & neck : Severe atheromatous stenosis at the right ICA bulb with short segment string sign. Second flow reducing stenosis in the supraclinoid right ICA. 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 The right vertebral artery gradually reconstitutes but is less intensely enhancing by the level of the basilar.  MRI : Small acute infarcts scattered along the deep watershed territory of the right cerebral hemisphere  2D Echo: Pending  LDL pending  HgbA1c pending  UDS neg  VTE prophylaxis -Lovenox 40 mg   No antithrombotic prior to admission, now on aspirin 300 mg suppository daily. Will consider DAPT once po access.  Therapy recommendations: Pending  Disposition: Pending  Carotid stenosis, right  CTA head & neck : Severe atheromatous stenosis at the right ICA bulb with short segment string sign. Second flow reducing stenosis in the supraclinoid right ICA. Left ICA  also severe atherosclerosis but no stenosis  Likely the cause of stroke  Recommend vascular surgery consultation    Seizure disorder  EEG 1/2 independent epileptogenicity arising from right> left frontal-anterior temporal region, no seizure  EEG repeat pending  On home dilantin 400mg  daily  Dilantin level 18.0 -> pending  Now on dilantin 200mg  bid - if no po access, will need convert to IV fosphenytoin 150mg  tid.   Hypertension  Home meds: hydralazine 25 mg, labetalol 100 mg, lisinopril 20 mg  Stable . Gradually normalize in 2-3 days . Long-term BP goal normotensive  Hyperlipidemia  Home meds: Lipitor 40 mg  LDL pending, goal < 70  Currently  no statin as no po access  Continue statin at discharge  Tobacco abuse  Current smoker  Smoking cessation counseling will be provided once mental status improves.  Other Stroke Risk Factors  Advanced Age >/= 48    Hospital day # 0  , MD PhD Stroke Neurology 12/11/2020 5:45 PM   To contact Stroke Continuity provider, please refer to 76. After hours, contact General Neurology

## 2020-12-12 ENCOUNTER — Encounter (HOSPITAL_COMMUNITY): Payer: Self-pay | Admitting: Internal Medicine

## 2020-12-12 DIAGNOSIS — R531 Weakness: Secondary | ICD-10-CM | POA: Diagnosis not present

## 2020-12-12 DIAGNOSIS — I639 Cerebral infarction, unspecified: Secondary | ICD-10-CM | POA: Diagnosis not present

## 2020-12-12 DIAGNOSIS — E663 Overweight: Secondary | ICD-10-CM | POA: Diagnosis present

## 2020-12-12 DIAGNOSIS — I6521 Occlusion and stenosis of right carotid artery: Secondary | ICD-10-CM | POA: Diagnosis not present

## 2020-12-12 DIAGNOSIS — I10 Essential (primary) hypertension: Secondary | ICD-10-CM | POA: Diagnosis not present

## 2020-12-12 DIAGNOSIS — I712 Thoracic aortic aneurysm, without rupture: Secondary | ICD-10-CM | POA: Diagnosis not present

## 2020-12-12 DIAGNOSIS — E119 Type 2 diabetes mellitus without complications: Secondary | ICD-10-CM | POA: Diagnosis not present

## 2020-12-12 DIAGNOSIS — R569 Unspecified convulsions: Secondary | ICD-10-CM | POA: Diagnosis not present

## 2020-12-12 LAB — HEMOGLOBIN A1C
Hgb A1c MFr Bld: 5.3 % (ref 4.8–5.6)
Mean Plasma Glucose: 105.41 mg/dL

## 2020-12-12 LAB — LIPID PANEL
Cholesterol: 128 mg/dL (ref 0–200)
HDL: 38 mg/dL — ABNORMAL LOW (ref >40)
LDL Cholesterol: 73 mg/dL (ref 0–99)
Total CHOL/HDL Ratio: 3.4 RATIO
Triglycerides: 83 mg/dL (ref <150)
VLDL: 17 mg/dL (ref 0–40)

## 2020-12-12 LAB — GLUCOSE, CAPILLARY
Glucose-Capillary: 71 mg/dL (ref 70–99)
Glucose-Capillary: 75 mg/dL (ref 70–99)
Glucose-Capillary: 106 mg/dL — ABNORMAL HIGH (ref 70–99)
Glucose-Capillary: 143 mg/dL — ABNORMAL HIGH (ref 70–99)

## 2020-12-12 MED ORDER — HYDRALAZINE HCL 20 MG/ML IJ SOLN
10.0000 mg | Freq: Once | INTRAMUSCULAR | Status: AC
  Administered 2020-12-12: 10 mg via INTRAVENOUS
  Filled 2020-12-12: qty 1

## 2020-12-12 MED ORDER — CLOPIDOGREL BISULFATE 75 MG PO TABS
75.0000 mg | ORAL_TABLET | Freq: Every day | ORAL | Status: DC
  Administered 2020-12-13 – 2020-12-20 (×7): 75 mg via ORAL
  Filled 2020-12-12 (×7): qty 1

## 2020-12-12 MED ORDER — CLOPIDOGREL BISULFATE 75 MG PO TABS
300.0000 mg | ORAL_TABLET | Freq: Once | ORAL | Status: AC
  Administered 2020-12-12: 300 mg via ORAL
  Filled 2020-12-12: qty 4

## 2020-12-12 NOTE — Evaluation (Signed)
Physical Therapy Evaluation Patient Details Name: Philip Robinson MRN: [id number] DOB: [birth year] Today's Date: 12/12/2020   History of Present Illness  Pt is a 70 y.o. male with a medical hx significant for seizures, HTN, dementia, stroke anuerysm, and DM2 who presents with generalized weakness especially on the L side, increased confusion, and difficulty standing since d/c 12/06/20 from recent hospitalization 2/2 seizures. MRI revealed small acute infarcts of the watershed area of the R cerebral hemisphere. CTA showed R ICA stenosis.  Clinical Impression  Pt presents with condition mentioned above and deficits mentioned below, see PT Problem List. Pt has a hx of dementia and only is able to recognize his daughter and that he lives with her, per chart. Via phone call to daughter, PTA pt was independent with household mobility and all functional mobility utilizing a SPC or daughter's rollator and was able to follow directions appropriately. He does not have 24/7 assistance available at home. Currently, pt demonstrates below baseline level of function, requiring maxA to transition supine > sit EOB, TA to return to supine, and maxA and bilat knee block to come to stand and maintain static standing due to a strong posterior lean. He demonstrates decreased cognition, having difficulty following simple directions, problem-solving, and recognizing his deficits with poor safety awareness. He displays symmetrical strength in his legs with MMT, but did display weakness in his bilat hip flexors and knee flexors, but may be due to poor ability to follow directions with testing. Pt also rapidly and not rhythmically would tap his R foot and not tap his L when cued to tap them symmetrically, possibly demonstrating R dysdiadochokinesia. Upon arrival, pt was rhythmically flexing and extending his R elbow on his abdomen, but was able to stop when cued. He rests with his L hand in fist position. Will continue to follow acutely.  Recommending d/c to SNF to maximize pt safety and independence with all functional mobility.    Follow Up Recommendations SNF;Supervision/Assistance - 24 hour    Equipment Recommendations  3in1 (PT);Rolling walker with 5" wheels;Hospital bed;Wheelchair (measurements PT);Wheelchair cushion (measurements PT) (depends on how pt progresses)    Recommendations for Other Services       Precautions / Restrictions Precautions Precautions: Fall Precaution Comments: seizures; dementia Restrictions Weight Bearing Restrictions: No      Mobility  Bed Mobility Overal bed mobility: Needs Assistance Bed Mobility: Supine to Sit;Sit to Supine     Supine to sit: Max assist Sit to supine: Total assist   General bed mobility comments: Pt resistive through legs with managing off EOB, requiring maxA and max cues to complete. Pt reahced with R hand for therapist's hand and pulled to ascend trunk, maxA. Pt unable to follow directions to return to supine and stated "I think I will sit", thus TA.    Transfers Overall transfer level: Needs assistance Equipment used: 1 person hand held assist Transfers: Sit to/from Stand Sit to Stand: Max assist         General transfer comment: MaxA to power up to stand from EOB with extra time and bilat knee block, displaying strong posterior lean. Tactile and verbal cues provided for feet and hand placement coming to stand from EOB. Pt did have L HHA upon coming to stand.  Ambulation/Gait             General Gait Details: Unable to ambulate safely this date with high BP and only 1 person assist.  Stairs  Wheelchair Mobility    Modified Rankin (Stroke Patients Only) Modified Rankin (Stroke Patients Only) Pre-Morbid Rankin Score: Moderate disability Modified Rankin: Severe disability     Balance Overall balance assessment: Needs assistance Sitting-balance support: Single extremity supported;Feet supported Sitting balance-Leahy  Scale: Poor Sitting balance - Comments: R UE support on bed majority of time with min guard for safety sitting statically EOB. Postural control: Posterior lean Standing balance support: Single extremity supported Standing balance-Leahy Scale: Zero Standing balance comment: Strong posterior lean resulting in maxA and bilat knee block with L HHA to maintain static standing balance.                             Pertinent Vitals/Pain Pain Assessment: No/denies pain    Home Living Family/patient expects to be discharged to:: Private residence Living Arrangements: Children (Daughter and her fiance) Available Help at Discharge: Family;Available PRN/intermittently Type of Home: House Home Access: Stairs to enter Entrance Stairs-Rails: Right;Left (cannot reach both at same time) Entrance Stairs-Number of Steps: 3 Home Layout: One level Home Equipment: Walker - 4 wheels;Cane - single point;Shower seat      Prior Function Level of Independence: Independent with assistive device(s)         Comments: Independent with use of SPC or daughter's rollator for mobility within home, rarely goes into community other than for appointments. Independent with use of AD/AE for grooming self, self hygeine, and bathing but pt did not drive, cook, or clean.     Hand Dominance   Dominant Hand: Right    Extremity/Trunk Assessment   Upper Extremity Assessment Upper Extremity Assessment: Defer to OT evaluation    Lower Extremity Assessment Lower Extremity Assessment: RLE deficits/detail;LLE deficits/detail RLE Deficits / Details: MMT scores of the following: hip flexion 3+, knee extension 5, knee flexion 4-, ankle dorsiflexion 5 (difficulty following directions for hip flexion) RLE Sensation:  (unable to follow directions to formally assess) RLE Coordination: decreased fine motor;decreased gross motor (possible dysdiadochokinesia on R noted as pt would rapidly tap but not with regular rhythm  but not tap with L when cued) LLE Deficits / Details: MMT scores of the following: hip flexion 3+, knee extension 5, knee flexion 4-, ankle dorsiflexion 5 (difficulty following directions for hip flexion) LLE Sensation:  (unable to follow directions to formally assess) LLE Coordination: decreased fine motor;decreased gross motor (possible dysdiadochokinesia on R noted as pt would rapidly tap but not with regular rhythm but not tap with L when cued)    Cervical / Trunk Assessment Cervical / Trunk Assessment: Kyphotic  Communication   Communication: Receptive difficulties  Cognition Arousal/Alertness: Lethargic Behavior During Therapy: Flat affect Overall Cognitive Status: Impaired/Different from baseline Area of Impairment: Orientation;Attention;Memory;Following commands;Safety/judgement;Awareness;Problem solving                 Orientation Level: Disoriented to;Place;Time;Situation (2011, unable to provide answers to other questions) Current Attention Level: Divided Memory: Decreased short-term memory;Decreased recall of precautions Following Commands: Follows one step commands inconsistently;Follows one step commands with increased time Safety/Judgement: Decreased awareness of safety;Decreased awareness of deficits Awareness: Intellectual Problem Solving: Slow processing;Decreased initiation;Difficulty sequencing;Requires verbal cues;Requires tactile cues General Comments: Pt with eyes closed majority of session, but communicating throughout. Would open eyes momentarily when cued. Disoriented to place, time, situation (baseline only able to recognize daughter at that he lives with her, per chart). Pt following ~25% of simple multi-modal commands. Poor recognition of his deficits, repeating directions and explanations for plan  for next task during session.      General Comments General comments (skin integrity, edema, etc.): At start of session pt was rhythmically wuickly moving R  hand superior and inferior (elbow flexion and extension) on chest, able to stop movement at rest and with active movement of arm when cued; Holds L hand in fist form throughout; BP ~170s/110 sitting EOB thus returned to supine after removed urine saturated pad from underneath buttocks with brief standing period    Exercises     Assessment/Plan    PT Assessment Patient needs continued PT services  PT Problem List Decreased strength;Decreased activity tolerance;Decreased balance;Decreased mobility;Decreased coordination;Decreased cognition;Decreased knowledge of use of DME;Decreased safety awareness;Decreased knowledge of precautions;Cardiopulmonary status limiting activity       PT Treatment Interventions DME instruction;Gait training;Stair training;Functional mobility training;Therapeutic activities;Therapeutic exercise;Balance training;Neuromuscular re-education;Cognitive remediation;Patient/family education    PT Goals (Current goals can be found in the Care Plan section)  Acute Rehab PT Goals Patient Stated Goal: did not state; Daughter reported desire to improve pt's mobility PT Goal Formulation: With patient/family Time For Goal Achievement: 12/26/20 Potential to Achieve Goals: Fair    Frequency Min 3X/week   Barriers to discharge Decreased caregiver support Pt's daughter in and out of home throughout day and her fiance works; Daughter stated via phone that if pt needs further assistance for mobility than they can provide then may need to consider ALF    Co-evaluation               AM-PAC PT "6 Clicks" Mobility  Outcome Measure Help needed turning from your back to your side while in a flat bed without using bedrails?: A Lot Help needed moving from lying on your back to sitting on the side of a flat bed without using bedrails?: A Lot Help needed moving to and from a bed to a chair (including a wheelchair)?: A Lot Help needed standing up from a chair using your arms (e.g.,  wheelchair or bedside chair)?: A Lot Help needed to walk in hospital room?: Total Help needed climbing 3-5 steps with a railing? : Total 6 Click Score: 10    End of Session Equipment Utilized During Treatment: Gait belt Activity Tolerance: Treatment limited secondary to medical complications (Comment) (BP high) Patient left: in bed;with bed alarm set;with call bell/phone within reach Nurse Communication: Mobility status;Other (comment) (BP high) PT Visit Diagnosis: Unsteadiness on feet (R26.81);Muscle weakness (generalized) (M62.81);Difficulty in walking, not elsewhere classified (R26.2);Other symptoms and signs involving the nervous system (R29.898)    Time: 4098-1191 PT Time Calculation (min) (ACUTE ONLY): 27 min   Charges:   PT Evaluation $PT Eval Moderate Complexity: 1 Mod PT Treatments $Therapeutic Activity: 8-22 mins        Raymond Gurney, PT, DPT Acute Rehabilitation Services  Pager: [phone] Office: [phone]   Jewel Baize 12/12/2020, 9:04 AM

## 2020-12-12 NOTE — Progress Notes (Signed)
Progress Note    Philip Robinson  XFG:[id number] DOB: 1951/03/22  DOA: 12/10/2020 PCP: Hermelinda Dellen, NP    Brief Narrative:   Chief complaint: Philip Robinson, new stroke  Medical records reviewed and are as summarized below:  Philip Robinson is an 70 y.o. male with a PMH of non-insulin dependent DM, HTN, AAA, seizure disorder, vascular dementia, tobacco abuse, recent admission for breakthrough seizures who was brought to the ED via EMS with weakness, worsening left leg weakness, stool incontinence and AMS over usual baseline per family report. In the ED, only oriented to self/place and was unable to articulate why he was brought to the ED. MRI obtained which showed watershed infarcts in the right hemisphere.  Neurologist consulted. Full stroke work up initiated and showed right ICA stenosis.  Vascular surgery considering intervention.  Will need SNF.  Assessment/Plan:   Principle Problem: Acute cerebrovascular accident, present on admission -MRI significant for small acute infarcts of the watershed area of the right cerebral hemisphere. Images personally reviewed and agree with findings. -Suspected to be secondary to right ICA stenosis seen on CTA. Images personally reviewed and agree with findings. -Neuro checks ordered. -Hemoglobin A1c and lipid panel resulted, no change in therapy. -PT/OT/Speech to eval and treat. -2 D Echocardiogram results reviewed: Left ventricular ejection fraction 60 to 65%. Grade I diastolic dysfunction (impaired relaxation). Elevated left ventricular end-diastolic pressure. -Continue ASA. -Neurologist following.  Active Problems: Right internal carotid artery stenosis, present on admission -CTA significant for severe athermatous stenosis of the right ICA bulb with short segment string sign. -Vascular surgery consulted to discuss options with family. Considering surgical intervention.  Seizure disorder -Dilantin level noted to be 18 on admission.    -Neurology recommended splitting up dilantin dose to 200 mg twice daily to decrease risk of peaks and valleys. -Continue seizure precautions. -Dilantin 200 mg twice daily p.o., but to utilize 150 mg IV 3 times daily until able to pass swallow evaluation.   Essential hypertension -Home blood pressure medications include amlodipine 10 mg daily, hydralazine 25 mg nightly, lisinopril 20 mg daily, and labetalol 100 mg with lunch. -Gradually normalized blood pressures over the next 2 to 3 days per neurology.  Vascular dementia -At baseline patient previously had been able to recognize his daughter and knew where he lived.  -Acute decompensation in mental status thought be be from stroke. -Continue Aricept.  Hyperlipidemia -LDL 73. -Continue statin  Diabetes mellitus type 2, non-insulin dependent, with circulatory complications Patient is diet controlled. Last hemoglobin A1c noted to be 6.1 on 03/08/2019. -Hemoglobin A1c 5.3, good control.  Tobacco abuse -Patient reportedly continues to smoke. -Continue nicotine.  Ascending aortic anyeurism -CT angiogram of the neck showed dilation of the arch to 4 cm. -Recommend outpatient follow-up and annual surveillance.  Nutritional status: Overweight  Body mass index is 28.11 kg/m.   Family Communication/Anticipated D/C date and plan/Code Status   DVT prophylaxis: enoxaparin (LOVENOX) injection 40 mg Start: 12/11/20 0830   Code Status: Full Code.  Family Communication: No family at the bedside. Daughter updated by telephone. Knows he will need rehab. Disposition Plan: Status is: Inpatient  Remains inpatient appropriate because:Inpatient level of Robinson appropriate due to severity of illness . Will need definitive decision re: vascular surgery intervention before d/c. Unsafe to d/c home in present condition.   Dispo: The patient is from: Home              Anticipated d/c is to: SNF  Anticipated d/c date is: 2 days               Patient currently is not medically stable to d/c.   Medical Consultants:    Neurology  Vascular Surgery   Anti-Infectives:    None  Subjective:   Denies paresthesias, weakness, speech/swallowing difficulty.  No complaints of pain or SOB. Confused. Disoriented.  Does not follow commands well.  Objective:    Vitals:   12/11/20 1645 12/11/20 1700 12/11/20 2332 12/12/20 0342  BP: (!) 177/116 (!) 168/113 (!) 182/96 (!) 149/84  Pulse: 61 (!) 57 64 (!) 54  Resp: 18 14    Temp:   98.2 F (36.8 C)   TempSrc:   Oral   SpO2: 98% 100% 100% 94%  Weight:      Height:       No intake or output data in the 24 hours ending 12/12/20 0658 Filed Weights   12/10/20 1830  Weight: 94 kg    Exam: General: No acute distress. Sitting up, working with PT. Cardiovascular: Heart sounds show a regular rate, and rhythm. No gallops or rubs. No murmurs. No JVD. Lungs: Clear to auscultation bilaterally with good air movement. No rales, rhonchi or wheezes. Abdomen: Soft, nontender, nondistended with normal active bowel sounds. No masses. No hepatosplenomegaly. Neurological: Alert and oriented 1, self only.  Thinks he is in a "house", unable to tell me date/month. Moves all extremities 4 with grossly equal strength, but hesitant to move or stand with PT. Seems confused, unable to follow commands consistently. Unable to stand without assistance, leaning backwards. Skin: Warm and dry. No rashes or lesions. Extremities: No clubbing or cyanosis. No edema. Pedal pulses 2+. Psychiatric: Mood and affect are flat. Insight and judgment are impaired.  Data Reviewed:   I have personally reviewed following labs and imaging studies:  Labs: Labs show the following:   Basic Metabolic Panel: Recent Labs  Lab 12/05/20 1208 12/10/20 1937  NA 134* 141  K 4.1 4.5  CL 102 103  CO2 23 26  GLUCOSE 130* 106*  BUN 14 18  CREATININE 0.95 1.06  CALCIUM 8.9 9.2  MG 1.7  --    GFR Estimated  Creatinine Clearance: 76.1 mL/min (by C-G formula based on SCr of 1.06 mg/dL). Liver Function Tests: Recent Labs  Lab 12/10/20 1937  AST 18  ALT 15  ALKPHOS 98  BILITOT 0.8  PROT 7.5  ALBUMIN 3.8    Recent Labs  Lab 12/10/20 1937  AMMONIA 30    CBC: Recent Labs  Lab 12/05/20 1208 12/10/20 1937  WBC 4.3 4.5  NEUTROABS 2.8 2.6  HGB 14.2 14.9  HCT 39.8 45.6  MCV 95.9 101.8*  PLT 180 206   CBG: Recent Labs  Lab 12/05/20 0658 12/10/20 1948 12/11/20 2024 12/11/20 2357 12/12/20 0409  GLUCAP 78 89 116* 118* 71   Hgb A1c: Recent Labs    12/12/20 0139  HGBA1C 5.3   Lipid Profile: Recent Labs    12/12/20 0139  CHOL 128  HDL 38*  LDLCALC 73  TRIG 83  CHOLHDL 3.4    Microbiology Recent Results (from the past 240 hour(s))  SARS CORONAVIRUS 2 (TAT 6-24 HRS) Nasopharyngeal Nasopharyngeal Swab     Status: None   Collection Time: 12/05/20 12:50 AM   Specimen: Nasopharyngeal Swab  Result Value Ref Range Status   SARS Coronavirus 2 NEGATIVE NEGATIVE Final    Comment: (NOTE) SARS-CoV-2 target nucleic acids are NOT DETECTED.  The SARS-CoV-2 RNA is  generally detectable in upper and lower respiratory specimens during the acute phase of infection. Negative results do not preclude SARS-CoV-2 infection, do not rule out co-infections with other pathogens, and should not be used as the sole basis for treatment or other patient management decisions. Negative results must be combined with clinical observations, patient history, and epidemiological information. The expected result is Negative.  Fact Sheet for Patients: HairSlick.no  Fact Sheet for Healthcare Providers: quierodirigir.com  This test is not yet approved or cleared by the [place] FDA and  has been authorized for detection and/or diagnosis of SARS-CoV-2 by FDA under an Emergency Use Authorization (EUA). This EUA will remain  in effect (meaning  this test can be used) for the duration of the COVID-19 declaration under Se ction 564(b)(1) of the Act, 21 U.S.C. section 360bbb-3(b)(1), unless the authorization is terminated or revoked sooner.  Performed at [facility], [address], [place], [place] [address]   Urine Culture     Status: Abnormal   Collection Time: 12/06/20 10:38 AM   Specimen: Urine, Clean Catch  Result Value Ref Range Status   Specimen Description URINE, CLEAN CATCH  Final   Special Requests NONE  Final   Culture (A)  Final    <10,000 COLONIES/mL INSIGNIFICANT GROWTH Performed at [facility], [address], [place], [place] [address]    Report Status 12/07/2020 FINAL  Final  Resp Panel by RT-PCR (Flu A&B, Covid) Nasopharyngeal Swab     Status: None   Collection Time: 12/11/20 12:04 AM   Specimen: Nasopharyngeal Swab; Nasopharyngeal(NP) swabs in vial transport medium  Result Value Ref Range Status   SARS Coronavirus 2 by RT PCR NEGATIVE NEGATIVE Final    Comment: (NOTE) SARS-CoV-2 target nucleic acids are NOT DETECTED.  The SARS-CoV-2 RNA is generally detectable in upper respiratory specimens during the acute phase of infection. The lowest concentration of SARS-CoV-2 viral copies this assay can detect is 138 copies/mL. A negative result does not preclude SARS-Cov-2 infection and should not be used as the sole basis for treatment or other patient management decisions. A negative result may occur with  improper specimen collection/handling, submission of specimen other than nasopharyngeal swab, presence of viral mutation(s) within the areas targeted by this assay, and inadequate number of viral copies(<138 copies/mL). A negative result must be combined with clinical observations, patient history, and epidemiological information. The expected result is Negative.  Fact Sheet for Patients:  BloggerCourse.com  Fact Sheet for Healthcare Providers:   SeriousBroker.it  This test is no t yet approved or cleared by the [place] FDA and  has been authorized for detection and/or diagnosis of SARS-CoV-2 by FDA under an Emergency Use Authorization (EUA). This EUA will remain  in effect (meaning this test can be used) for the duration of the COVID-19 declaration under Section 564(b)(1) of the Act, 21 U.S.C.section 360bbb-3(b)(1), unless the authorization is terminated  or revoked sooner.       Influenza A by PCR NEGATIVE NEGATIVE Final   Influenza B by PCR NEGATIVE NEGATIVE Final    Comment: (NOTE) The Xpert Xpress SARS-CoV-2/FLU/RSV plus assay is intended as an aid in the diagnosis of influenza from Nasopharyngeal swab specimens and should not be used as a sole basis for treatment. Nasal washings and aspirates are unacceptable for Xpert Xpress SARS-CoV-2/FLU/RSV testing.  Fact Sheet for Patients: BloggerCourse.com  Fact Sheet for Healthcare Providers: SeriousBroker.it  This test is not yet approved or cleared by the [place] and  has been authorized for detection and/or diagnosis of SARS-CoV-2 by FDA under an Emergency Use Authorization (EUA). This EUA will remain in effect (meaning this test can be used) for the duration of the COVID-19 declaration under Section 564(b)(1) of the Act, 21 U.S.C. section 360bbb-3(b)(1), unless the authorization is terminated or revoked.  Performed at [facility], [address] [place], [place] [address]     Procedures and diagnostic studies:  CT ANGIO HEAD W OR WO CONTRAST  Result Date: 12/11/2020 CLINICAL DATA:  Seizure and acute stroke EXAM: CT ANGIOGRAPHY HEAD AND NECK TECHNIQUE: Multidetector CT imaging of the head and neck was performed using the standard protocol during bolus administration of intravenous contrast. Multiplanar CT image reconstructions and MIPs were obtained to evaluate the  vascular anatomy. Carotid stenosis measurements (when applicable) are obtained utilizing NASCET criteria, using the distal internal carotid diameter as the denominator. CONTRAST:  75mL OMNIPAQUE IOHEXOL 350 MG/ML SOLN COMPARISON:  Brain MRI from earlier today FINDINGS: CT HEAD FINDINGS Brain: Small acute infarcts by MRI are not perceptible separate from extensive chronic small vessel ischemia in the cerebral white matter. Chronic lacunar infarcts in the left pons and deep gray nuclei. Chronic cortical infarct in the right occipital lobe. No acute hemorrhage, hydrocephalus, or masslike finding. Cerebral volume loss. Vascular: See below Skull: Negative Sinuses: Negative Orbits: Negative Review of the MIP images confirms the above findings CTA NECK FINDINGS Aortic arch: Atheromatous plaque.  4 cm diameter aortic arch. Right carotid system: Diffuse atheromatous wall thickening of the common carotid with mixed density plaque at the bifurcation. Predominately low-density plaque at the bulb creates a short segment string sign with patent channel only measuring 1 mm, at least 75% stenosis. No superimposed dissection or ulceration. Left carotid system: Atheromatous wall thickening of the common carotid with mixed density plaque at the bifurcation. No flow reducing stenosis, ulceration, or dissection. Vertebral arteries: Proximal subclavian atherosclerosis without ulceration or dissection. High-grade narrowing of the dominant left vertebral origin. Occluded right V1 segment with reconstitution at the distal V1 level and thready flow and less luminal density by the dura. The right vertebral artery gradually increases in size. Skeleton: Spondylosis and degenerative disc narrowing. Posterior longitudinal ligament ossification at C4 to C6, likely contacting the ventral cord at C5. No acute finding. Other neck: Negative Upper chest: Biapical fibrosis. Review of the MIP images confirms the above findings CTA HEAD FINDINGS Anterior  circulation: Carotid siphon heavy atheromatous calcification with prominent calcified plaque at the supraclinoid right ICA causing 80% stenosis by measurement. Atheromatous irregularity of MCA vessels diffusely. No proximal branch occlusion, beading, or aneurysm. Posterior circulation: Less intense flow in the right vertebral artery in the setting of proximal occlusion and reconstitution. Atheromatous irregularity of the left V4 segment with mild to moderate distal narrowing. Diffusely patent basilar. PCA branches are atheromatous especially at the right P2 segment. Venous sinuses: Unremarkable in the arterial phase Anatomic variants: None significant Review of the MIP images confirms the above findings IMPRESSION: 1. Severe atheromatous stenosis at the right ICA bulb with short segment string sign. Second flow reducing stenosis in the supraclinoid right ICA. 2. 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 The right vertebral artery gradually reconstitutes but is less intensely enhancing by the level of the basilar. 3. 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 4. No intracranial hemorrhage. Known acute infarcts are not visible separate from the patient's extensive chronic ischemic injury. Electronically Signed   By: Kathrynn DuckingJonathon  Watts M.D.  On: 12/11/2020 08:16   CT HEAD WO CONTRAST  Result Date: 12/10/2020 CLINICAL DATA:  Delirium. Additional history provided: Seizure last week, generalized weakness, increased left leg weakness, episode of stool incontinence today. EXAM: CT HEAD WITHOUT CONTRAST TECHNIQUE: Contiguous axial images were obtained from the base of the skull through the vertex without intravenous contrast. COMPARISON:  Prior head CT examinations 12/05/2020 and earlier. Brain MRI 05/25/2015. FINDINGS: Brain: Mild cerebral and cerebellar atrophy. 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 Redemonstrated chronic lacunar infarcts within  the bilateral corona radiata/basal ganglia, thalami, pons and cerebellar hemispheres. Background advanced ill-defined hypoattenuation within the cerebral white matter is nonspecific, but compatible with chronic small vessel ischemic disease. Chronic punctate focus of calcification within the right pons (series 3, image 23). There is no acute intracranial hemorrhage. No acute demarcated cortical infarct. No extra-axial fluid collection. No evidence of intracranial mass. No midline shift. Vascular: No hyperdense vessel.  Atherosclerotic calcifications. Skull: Normal. Negative for fracture or focal lesion. Sinuses/Orbits: Visualized orbits show no acute finding. Small volume frothy secretions within a posterior left ethmoid air cell and within the left sphenoid sinus. Trace bilateral ethmoid sinus mucosal thickening. IMPRESSION: No evidence of acute intracranial abnormality. 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 Redemonstrated chronic lacunar infarcts within the bilateral corona radiata/basal ganglia, thalami, pons and cerebellar hemispheres. Stable background mild generalized atrophy of the brain and advanced cerebral white matter chronic small vessel ischemic disease. Mild paranasal sinus disease, most notably left ethmoid and sphenoid sinusitis. Electronically Signed   By: Jackey Loge DO   On: 12/10/2020 19:26   CT ANGIO NECK W OR WO CONTRAST  Result Date: 12/11/2020 CLINICAL DATA:  Seizure and acute stroke EXAM: CT ANGIOGRAPHY HEAD AND NECK TECHNIQUE: Multidetector CT imaging of the head and neck was performed using the standard protocol during bolus administration of intravenous contrast. Multiplanar CT image reconstructions and MIPs were obtained to evaluate the vascular anatomy. Carotid stenosis measurements (when applicable) are obtained utilizing NASCET criteria, using the distal internal carotid diameter as the denominator. CONTRAST:  75mL OMNIPAQUE IOHEXOL 350 MG/ML  SOLN COMPARISON:  Brain MRI from earlier today FINDINGS: CT HEAD FINDINGS Brain: Small acute infarcts by MRI are not perceptible separate from extensive chronic small vessel ischemia in the cerebral white matter. Chronic lacunar infarcts in the left pons and deep gray nuclei. Chronic cortical infarct in the right occipital lobe. No acute hemorrhage, hydrocephalus, or masslike finding. Cerebral volume loss. Vascular: See below Skull: Negative Sinuses: Negative Orbits: Negative Review of the MIP images confirms the above findings CTA NECK FINDINGS Aortic arch: Atheromatous plaque.  4 cm diameter aortic arch. Right carotid system: Diffuse atheromatous wall thickening of the common carotid with mixed density plaque at the bifurcation. Predominately low-density plaque at the bulb creates a short segment string sign with patent channel only measuring 1 mm, at least 75% stenosis. No superimposed dissection or ulceration. Left carotid system: Atheromatous wall thickening of the common carotid with mixed density plaque at the bifurcation. No flow reducing stenosis, ulceration, or dissection. Vertebral arteries: Proximal subclavian atherosclerosis without ulceration or dissection. High-grade narrowing of the dominant left vertebral origin. Occluded right V1 segment with reconstitution at the distal V1 level and thready flow and less luminal density by the dura. The right vertebral artery gradually increases in size. Skeleton: Spondylosis and degenerative disc narrowing. Posterior longitudinal ligament ossification at C4 to C6, likely contacting the ventral cord at C5. No acute finding. Other neck: Negative Upper chest: Biapical fibrosis. Review of  the MIP images confirms the above findings CTA HEAD FINDINGS Anterior circulation: Carotid siphon heavy atheromatous calcification with prominent calcified plaque at the supraclinoid right ICA causing 80% stenosis by measurement. Atheromatous irregularity of MCA vessels diffusely.  No proximal branch occlusion, beading, or aneurysm. Posterior circulation: Less intense flow in the right vertebral artery in the setting of proximal occlusion and reconstitution. Atheromatous irregularity of the left V4 segment with mild to moderate distal narrowing. Diffusely patent basilar. PCA branches are atheromatous especially at the right P2 segment. Venous sinuses: Unremarkable in the arterial phase Anatomic variants: None significant Review of the MIP images confirms the above findings IMPRESSION: 1. Severe atheromatous stenosis at the right ICA bulb with short segment string sign. Second flow reducing stenosis in the supraclinoid right ICA. 2. 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 The right vertebral artery gradually reconstitutes but is less intensely enhancing by the level of the basilar. 3. 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 4. No intracranial hemorrhage. Known acute infarcts are not visible separate from the patient's extensive chronic ischemic injury. Electronically Signed   By: Marnee Spring M.D.   On: 12/11/2020 08:16   MR Brain Wo Contrast (neuro protocol)  Result Date: 12/11/2020 CLINICAL DATA:  Delirium and left-sided weakness EXAM: MRI HEAD WITHOUT CONTRAST TECHNIQUE: Multiplanar, multiecho pulse sequences of the brain and surrounding structures were obtained without intravenous contrast. COMPARISON:  Head CT from yesterday FINDINGS: Brain: Tiny acute infarcts scattered along the corona radiata and cortex of the right frontal to occipital lobes. Small acute infarct at the genu of the right internal capsule. There is a background of advanced chronic small vessel disease with extensive gliosis and chronic lacune is in the left pons and bilateral deep gray nuclei. Remote cortical infarct in the right occipital lobe. Small remote bilateral cerebellar infarctions. No mass, acute hemorrhage, obstructive hydrocephalus, or extra-axial collection.  Vascular: Absent flow void in the right V4 segment just beyond the dura, but normalized more distally and potentially affected by motion artifact on this slice. Skull and upper cervical spine: Normal marrow signal Sinuses/Orbits: Unremarkable Other: Intermittent significant motion degradation. IMPRESSION: 1. Small acute infarcts scattered along the deep watershed territory of the right cerebral hemisphere. 2. Background of advanced chronic ischemic injury. 3. Motion degraded Electronically Signed   By: Marnee Spring M.D.   On: 12/11/2020 04:24   DG Abdomen Acute W/Chest  Result Date: 12/10/2020 CLINICAL DATA:  Check for metal.  MRI clearance. EXAM: DG ABDOMEN ACUTE WITH 1 VIEW CHEST COMPARISON:  X-ray abdomen 05/25/2015, chest x-ray 12/06/2017 FINDINGS: There is no evidence of dilated bowel loops or free intraperitoneal air. No radiopaque calculi or other significant radiographic abnormality is seen. The heart size and mediastinal contours are unchanged. Aortic arch calcifications. Biapical pleural/pulmonary scarring. Nipple shadow overlies the left lower lobe. Similar appearing calcified granuloma at the right apex. No focal consolidation. No pulmonary edema. No pleural effusion. No pneumothorax. No acute osseous abnormality. Multilevel degenerative changes of the spine. No unexpected metallic density identified.  Overlying cardiac leads. IMPRESSION: Negative abdominal radiographs.  No acute cardiopulmonary disease. Electronically Signed   By: Tish Frederickson M.D.   On: 12/10/2020 23:59   ECHOCARDIOGRAM COMPLETE  Result Date: 12/11/2020    ECHOCARDIOGRAM REPORT   Patient Name:   Philip Robinson Date of Exam: 12/11/2020 Medical Rec #:  [id number]      Height:       72.0 in Accession #:    [id number]  Weight:       207.2 lb Date of Birth:  03/21/1951       BSA:          2.163 m Patient Age:    69 years       BP:           146/85 mmHg Patient Gender: M              HR:           55 bpm. Exam Location:  Inpatient  Procedure: 2D Echo, Cardiac Doppler and Color Doppler Indications:    Stroke 434.91 / I163.9  History:        Patient has no prior history of Echocardiogram examinations.                 Risk Factors:Hypertension and Diabetes.  Sonographer:    Tiffany Dance Referring Phys: [id number] RONDELL A SMITH IMPRESSIONS  1. Left ventricular ejection fraction, by estimation, is 60 to 65%. The left ventricle has normal function. The left ventricle has no regional wall motion abnormalities. There is moderate concentric left ventricular hypertrophy. Left ventricular diastolic parameters are consistent with Grade I diastolic dysfunction (impaired relaxation). Elevated left ventricular end-diastolic pressure.  2. Right ventricular systolic function is normal. The right ventricular size is normal.  3. Left atrial size was mildly dilated.  4. The mitral valve is normal in structure. No evidence of mitral valve regurgitation. No evidence of mitral stenosis.  5. The aortic valve is calcified. There is moderate calcification of the aortic valve. There is moderate thickening of the aortic valve. Aortic valve regurgitation is not visualized. No aortic stenosis is present.  6. Aortic dilatation noted. There is mild dilatation at the level of the sinuses of Valsalva, measuring 43 mm. There is mild dilatation of the ascending aorta, measuring 41 mm.  7. The inferior vena cava is normal in size with greater than 50% respiratory variability, suggesting right atrial pressure of 3 mmHg. FINDINGS  Left Ventricle: Left ventricular ejection fraction, by estimation, is 60 to 65%. The left ventricle has normal function. The left ventricle has no regional wall motion abnormalities. The left ventricular internal cavity size was normal in size. There is  moderate concentric left ventricular hypertrophy. Left ventricular diastolic parameters are consistent with Grade I diastolic dysfunction (impaired relaxation). Elevated left ventricular end-diastolic  pressure. Right Ventricle: The right ventricular size is normal. No increase in right ventricular wall thickness. Right ventricular systolic function is normal. Left Atrium: Left atrial size was mildly dilated. Right Atrium: Right atrial size was normal in size. Pericardium: There is no evidence of pericardial effusion. Mitral Valve: The mitral valve is normal in structure. No evidence of mitral valve regurgitation. No evidence of mitral valve stenosis. Tricuspid Valve: The tricuspid valve is normal in structure. Tricuspid valve regurgitation is not demonstrated. No evidence of tricuspid stenosis. Aortic Valve: The aortic valve is calcified. There is moderate calcification of the aortic valve. There is moderate thickening of the aortic valve. Aortic valve regurgitation is not visualized. No aortic stenosis is present. Pulmonic Valve: The pulmonic valve was normal in structure. Pulmonic valve regurgitation is not visualized. No evidence of pulmonic stenosis. Aorta: Aortic dilatation noted. There is mild dilatation at the level of the sinuses of Valsalva, measuring 43 mm. There is mild dilatation of the ascending aorta, measuring 41 mm. Venous: The inferior vena cava is normal in size with greater than 50% respiratory variability, suggesting right atrial pressure of 3 mmHg.  IAS/Shunts: No atrial level shunt detected by color flow Doppler.  LEFT VENTRICLE PLAX 2D LVIDd:         4.80 cm  Diastology LVIDs:         2.90 cm  LV e' medial:    2.72 cm/s LV PW:         1.40 cm  LV E/e' medial:  19.0 LV IVS:        1.40 cm  LV e' lateral:   4.35 cm/s LVOT diam:     2.30 cm  LV E/e' lateral: 11.9 LV SV:         87 LV SV Index:   40 LVOT Area:     4.15 cm  RIGHT VENTRICLE            IVC RV Basal diam:  2.10 cm    IVC diam: 1.90 cm RV S prime:     8.70 cm/s TAPSE (M-mode): 2.5 cm LEFT ATRIUM             Index       RIGHT ATRIUM           Index LA diam:        3.40 cm 1.57 cm/m  RA Area:     14.80 cm LA Vol (A2C):   64.9 ml  30.01 ml/m RA Volume:   39.20 ml  18.12 ml/m LA Vol (A4C):   41.2 ml 19.05 ml/m LA Biplane Vol: 54.9 ml 25.38 ml/m  AORTIC VALVE LVOT Vmax:   101.00 cm/s LVOT Vmean:  73.300 cm/s LVOT VTI:    0.210 m  AORTA Ao Root diam: 4.30 cm Ao Asc diam:  4.10 cm MITRAL VALVE MV Area (PHT): 2.09 cm    SHUNTS MV Decel Time: 363 msec    Systemic VTI:  0.21 m MV E velocity: 51.80 cm/s  Systemic Diam: 2.30 cm MV A velocity: 72.80 cm/s MV E/A ratio:  0.71 Chilton Si MD Electronically signed by Chilton Si MD Signature Date/Time: 12/11/2020/3:43:17 PM    Final     Medications:   . aspirin  300 mg Rectal Daily   Or  . aspirin  325 mg Oral Daily  . atorvastatin  40 mg Oral QHS  . donepezil  5 mg Oral Q lunch  . enoxaparin (LOVENOX) injection  40 mg Subcutaneous Q24H  . mirabegron ER  50 mg Oral Q lunch  . mirtazapine  7.5 mg Oral Q lunch  . nicotine  14 mg Transdermal Daily  . phenytoin  200 mg Oral BID  . sertraline  100 mg Oral Q lunch  . tamsulosin  0.4 mg Oral Q lunch   Continuous Infusions:   LOS: 1 day   Hillery Aldo, MD  [facility]   [facility] How to contact the [facility] Attending or Consulting provider 7A - 7P or covering provider during after hours 7P -7A, for this patient?  1. Check the Robinson team in [facility] and look for a) attending/consulting TRH provider listed and b) the [facility] team listed 2. Log into [email or web address] and use [place]'s universal password to access. If you do not have the password, please contact the hospital operator. 3. Locate the [facility] provider you are looking for under [facility] and page to a number that you can be directly reached. 4. If you still have difficulty reaching the provider, please page the [facility] (Director on Call) for the Hospitalists listed on amion for assistance.  12/12/2020, 6:58 AM

## 2020-12-12 NOTE — Progress Notes (Signed)
[facility]  Subjective  -no acute events overnight.  Does not really want to participate in a neuro exam this morning.  Severe dementia at baseline.   Objective (!) 149/84 (!) 54 98.2 F (36.8 C) (Oral) 14 94% No intake or output data in the 24 hours ending 12/12/20 1001  Appears neuro intact by my exam although somewhat limited participation  Laboratory Lab Results: Recent Labs    12/10/20 1937  WBC 4.5  HGB 14.9  HCT 45.6  PLT 206   BMET Recent Labs    12/10/20 1937  NA 141  K 4.5  CL 103  CO2 26  GLUCOSE 106*  BUN 18  CREATININE 1.06  CALCIUM 9.2    COAG No results found for: INR, PROTIME No results found for: PTT  Assessment/Planning:  70 year old male seen in the ED yesterday for symptomatic high-grade right ICA stenosis with right brain event.  I have reviewed his images and I suspect his right ICA stenosis is likely the source.  I did discuss with his daughter on the phone yesterday options of carotid revascularization given his advanced dementia and she states that she would want surgery to give him the best chance of living independently.  I would recommend starting him on dual antiplatelet therapy with aspirin Plavix.  I am going to evaluate him for TCAR with carotid stent later this week given tandem lesion with a second severe stenosis in the supraclinoid right ICA which makes him high risk.  He will also need to be on a statin and appears he is on lipitor.  Cephus Shelling 12/12/2020 10:01 AM --

## 2020-12-12 NOTE — Progress Notes (Signed)
Physical Therapy Treatment Patient Details Name: Philip Robinson MRN: [id number] DOB: August 31, 1951 Today's Date: 12/12/2020    History of Present Illness Pt is a 70 y.o. male with a medical hx significant for seizures, HTN, dementia, stroke anuerysm, and DM2 who presents with generalized weakness especially on the L side, increased confusion, and difficulty standing since d/c 12/06/20 from recent hospitalization 2/2 seizures. MRI revealed small acute infarcts of the watershed area of the R cerebral hemisphere. CTA showed R ICA stenosis.    PT Comments    RN requesting assistance from PT to transfer pt to commode, thus returned for 2nd session this date. Pt maintaining eyes open for longer durations now but still having difficulty following commands, impacting his safety with mobility. Required maxA to come to sit but only minAx2 to return to supine this time. Still requiring maxA to come to stand from EOB or commode with poor carryover for hand and feet placement. MaxAx1 from therapist with minAx1 required to direct pt with max cues at hips and verbally to sequence steps to transfer EOB <> commode, with pt taking small shuffling steps while maintaining a strong posterior lean. Will continue to follow acutely. Current recommendations remain appropriate.   Follow Up Recommendations  SNF;Supervision/Assistance - 24 hour     Equipment Recommendations  3in1 (PT);Rolling walker with 5" wheels;Hospital bed;Wheelchair (measurements PT);Wheelchair cushion (measurements PT) (depends on how pt progresses)    Recommendations for Other Services       Precautions / Restrictions Precautions Precautions: Fall Precaution Comments: seizures; dementia Restrictions Weight Bearing Restrictions: No    Mobility  Bed Mobility Overal bed mobility: Needs Assistance Bed Mobility: Supine to Sit;Sit to Supine     Supine to sit: Max assist Sit to supine: Min assist;+2 for physical assistance   General bed mobility  comments: Pt reached with L hand for therapist's hand and pulled to ascend trunk, maxA. Pt able to initiate return to supine after max cues, minAx2 with assist from RN to return to supine safely.  Transfers Overall transfer level: Needs assistance Equipment used: 2 person hand held assist Transfers: Sit to/from [employer] Sit to Stand: Max assist Stand pivot transfers: Max assist;Min assist       General transfer comment: MaxA to power up to stand from EOB with extra time, displaying strong posterior lean. Tactile and verbal cues provided for feet and hand placement coming to stand, poor carryover. MaxAx1 from therapist and minAx1 from RN to direct pt to step laterally to L and R 1x each to transfer EOB <> commode.  Ambulation/Gait Ambulation/Gait assistance: Max assist;Min assist Gait Distance (Feet): 1 Feet Assistive device: 2 person hand held assist Gait Pattern/deviations: Step-through pattern;Decreased step length - right;Decreased step length - left;Shuffle;Decreased weight shift to right;Decreased weight shift to left;Decreased stride length;Leaning posteriorly;Trunk flexed Gait velocity: reduced Gait velocity interpretation: <1.31 ft/sec, indicative of household ambulator General Gait Details: Small shuffling steps to R and L to transfer EOB <> commode 1x either direction with maxAx1 from therapist and minAx1 from RN to direct hips and cue pt throughout, poor and slow processing. Strong posterior lean.   Stairs             Wheelchair Mobility    Modified Rankin (Stroke Patients Only) Modified Rankin (Stroke Patients Only) Pre-Morbid Rankin Score: Moderate disability Modified Rankin: Moderately severe disability     Balance Overall balance assessment: Needs assistance Sitting-balance support: Single extremity supported;Feet supported Sitting balance-Leahy Scale: Poor Sitting balance - Comments: R UE  support on bed majority of time with min guard for  safety sitting statically EOB. Postural control: Posterior lean Standing balance support: Bilateral upper extremity supported;During functional activity Standing balance-Leahy Scale: Zero Standing balance comment: Strong posterior lean resulting in maxAx1 with intermittent minAx1 to maintain standing balance.                            Cognition Arousal/Alertness: Lethargic Behavior During Therapy: Flat affect Overall Cognitive Status: Impaired/Different from baseline Area of Impairment: Orientation;Attention;Memory;Following commands;Safety/judgement;Awareness;Problem solving                 Orientation Level: Disoriented to;Place;Time;Situation (2011, unable to provide answers to other questions) Current Attention Level: Divided Memory: Decreased short-term memory;Decreased recall of precautions Following Commands: Follows one step commands inconsistently;Follows one step commands with increased time Safety/Judgement: Decreased awareness of safety;Decreased awareness of deficits Awareness: Intellectual Problem Solving: Slow processing;Decreased initiation;Difficulty sequencing;Requires verbal cues;Requires tactile cues General Comments: Pt following ~25% of simple multi-modal commands. Poor recognition of his deficits, repeating directions and explanations for plan for next task during session.      Exercises      General Comments General comments (skin integrity, edema, etc.): At start of session pt was rhythmically wuickly moving R hand superior and inferior (elbow flexion and extension) on chest, able to stop movement at rest and with active movement of arm when cued; Holds L hand in fist form throughout; BP ~170s/110 sitting EOB thus returned to supine after removed urine saturated pad from underneath buttocks with brief standing period      Pertinent Vitals/Pain Pain Assessment: Faces Faces Pain Scale: No hurt    Home Living Family/patient expects to be  discharged to:: Private residence Living Arrangements: Children (Daughter and her fiance) Available Help at Discharge: Family;Available PRN/intermittently Type of Home: House Home Access: Stairs to enter Entrance Stairs-Rails: Right;Left (cannot reach both at same time) Home Layout: One level Home Equipment: Walker - 4 wheels;Cane - single point;Shower seat      Prior Function Level of Independence: Independent with assistive device(s)      Comments: Independent with use of SPC or daughter's rollator for mobility within home, rarely goes into community other than for appointments. Independent with use of AD/AE for grooming self, self hygeine, and bathing but pt did not drive, cook, or clean.   PT Goals (current goals can now be found in the care plan section) Acute Rehab PT Goals Patient Stated Goal: did not state PT Goal Formulation: With patient/family Time For Goal Achievement: 12/26/20 Potential to Achieve Goals: Fair Progress towards PT goals: Progressing toward goals    Frequency    Min 3X/week      PT Plan Current plan remains appropriate    Co-evaluation              AM-PAC PT "6 Clicks" Mobility   Outcome Measure  Help needed turning from your back to your side while in a flat bed without using bedrails?: A Lot Help needed moving from lying on your back to sitting on the side of a flat bed without using bedrails?: A Lot Help needed moving to and from a bed to a chair (including a wheelchair)?: A Lot Help needed standing up from a chair using your arms (e.g., wheelchair or bedside chair)?: A Lot Help needed to walk in hospital room?: Total Help needed climbing 3-5 steps with a railing? : Total 6 Click Score: 10    End  of Session Equipment Utilized During Treatment: Gait belt Activity Tolerance: Patient tolerated treatment well Patient left: in bed;with bed alarm set;with call bell/phone within reach;with nursing/sitter in room Nurse Communication:  Mobility status PT Visit Diagnosis: Unsteadiness on feet (R26.81);Muscle weakness (generalized) (M62.81);Difficulty in walking, not elsewhere classified (R26.2);Other symptoms and signs involving the nervous system (R29.898)     Time: 1610-9604 PT Time Calculation (min) (ACUTE ONLY): 26 min  Charges:  $Therapeutic Activity: 23-37 mins                     Raymond Gurney, PT, DPT Acute Rehabilitation Services  Pager: [phone] Office: [phone]    Jewel Baize 12/12/2020, 9:46 AM

## 2020-12-12 NOTE — Plan of Care (Signed)
  Problem: Clinical Measurements: Goal: Ability to maintain clinical measurements within normal limits will improve Outcome: Progressing Goal: Respiratory complications will improve Outcome: Progressing Goal: Cardiovascular complication will be avoided Outcome: Progressing   Problem: Coping: Goal: Level of anxiety will decrease Outcome: Progressing   Problem: Elimination: Goal: Will not experience complications related to urinary retention Outcome: Progressing   Problem: Safety: Goal: Ability to remain free from injury will improve Outcome: Progressing   Problem: Skin Integrity: Goal: Risk for impaired skin integrity will decrease Outcome: Progressing

## 2020-12-12 NOTE — Progress Notes (Addendum)
STROKE TEAM PROGRESS NOTE   INTERVAL HISTORY No family at the bedside. Patient sitting up in bed for lunch. He is more awake alert today and cooperative on exam, however, still not orientated, likely his baseline. VVS on board, plan of TCAR later this week.   Vitals:   12/11/20 1645 12/11/20 1700 12/11/20 2332 12/12/20 0342  BP: (!) 177/116 (!) 168/113 (!) 182/96 (!) 149/84  Pulse: 61 (!) 57 64 (!) 54  Resp: 18 14    Temp:   98.2 F (36.8 C)   TempSrc:   Oral   SpO2: 98% 100% 100% 94%  Weight:      Height:       CBC:  Recent Labs  Lab 12/05/20 1208 12/10/20 1937  WBC 4.3 4.5  NEUTROABS 2.8 2.6  HGB 14.2 14.9  HCT 39.8 45.6  MCV 95.9 101.8*  PLT 180 206   Basic Metabolic Panel:  Recent Labs  Lab 12/05/20 1208 12/10/20 1937  NA 134* 141  K 4.1 4.5  CL 102 103  CO2 23 26  GLUCOSE 130* 106*  BUN 14 18  CREATININE 0.95 1.06  CALCIUM 8.9 9.2  MG 1.7  --    Urine Drug Screen:  Recent Labs  Lab 12/10/20 1843  LABOPIA NONE DETECTED  COCAINSCRNUR NONE DETECTED  LABBENZ NONE DETECTED  AMPHETMU NONE DETECTED  THCU NONE DETECTED  LABBARB NONE DETECTED     IMAGING past 24 hours  CT Head wo contrast 12/11/2019 IMPRESSION: No evidence of acute intracranial abnormality.  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  Redemonstrated chronic lacunar infarcts within the bilateral corona radiata/basal ganglia, thalami, pons and cerebellar hemispheres.  Stable background mild generalized atrophy of the brain and advanced cerebral white matter chronic small vessel ischemic disease.  Mild paranasal sinus disease, most notably left ethmoid and sphenoid Sinusitis.  MR Brain wo Contrast 12/12/2019 IMPRESSION: 1. Small acute infarcts scattered along the deep watershed territory of the right cerebral hemisphere. 2. Background of advanced chronic ischemic injury. 3. Motion degraded  CTA head and Neck: 12/12/2019   IMPRESSION: 1.  Severe atheromatous stenosis at the right ICA bulb with short segment string sign. Second flow reducing stenosis in the supraclinoid right ICA. 2. 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 The right vertebral artery gradually reconstitutes but is less intensely enhancing by the level of the basilar. 3. 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 4. No intracranial hemorrhage. Known acute infarcts are not visible separate from the patient's extensive chronic ischemic injury.    PHYSICAL EXAM General: Well developed, well nourished elderly male sitting up in bed, NAD. HEENT: [place]/AT, no scleral injection. Cardiovascular: RRR, S1, S2 heard.  No R/M/G. Respiratory: Clear to auscultation bilaterally, normal respiratory effort Neurological:  Mental Status: Awake alert but disoriented.  Not orientated to time place and age. Speech limited but fluent without evidence of aphasia.  Able to follow 1step commands. Cranial Nerves: II:  Visual fields grossly normal, pupils equal, round, reactive to light and accommodation III,IV, VI: ptosis not present, extra-ocular motions intact bilaterally V,VII: Face symmetric VIII: hearing normal bilaterally XII: midline tongue extension without atrophy or fasciculations  Motor: Right : Upper extremity   5/5    Left:     Upper extremity   4+/5  Lower extremity   5/5     Lower extremity   4+/5 Tone and bulk:normal tone throughout; no atrophy noted Sensory: Noncooperative patient.  Plantars: Right: downgoing   Left: downgoing  Cerebellar: Unable to follow commands Gait: Deferred   ASSESSMENT/PLAN [name] is a 70 y.o. male with PMHx  of seizures, dementia, tobacco abuse, hypertension, non-insulin-dependent type 2 diabetes that presents to the emergency department today for altered mental status. MRI brain shows small acute infarcts scattered along the deep watershed territory of the right cerebral  hemisphere. Did not receive tPA as outside of window.  Stroke:  acute infarcts scattered along the deep watershed territory of the right cerebral hemisphere possibly due to large vessel source of right ICA high grade stenosis  CT head No evidence of acute intracranial abnormality.  CTA head & neck : Severe atheromatous stenosis at the right ICA bulb with short segment string sign. Second flow reducing stenosis in the supraclinoid right ICA. 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 The right vertebral artery gradually reconstitutes but is less intensely enhancing by the level of the basilar.  MRI : Small acute infarcts scattered along the deep watershed territory of the right cerebral hemisphere  2D Echo EF 60-65%  LDL 73  HgbA1c 5.3  UDS neg  VTE prophylaxis -Lovenox 40 mg   No antithrombotic prior to admission, now on ASA 325 and plavix 75 after 300mg  load. Continue DAPT per VVS  Therapy recommendations: SNF  Disposition: Pending  Carotid stenosis, right  CTA head & neck : Severe atheromatous stenosis at the right ICA bulb with short segment string sign. Second flow reducing stenosis in the supraclinoid right ICA. Left ICA also severe atherosclerosis but no stenosis  Likely the cause of stroke  VVS on board with Dr. , plan for TCAR next week  On DAPT and statin now   Seizure disorder  EEG 1/2 independent epileptogenicity arising from right> left frontal-anterior temporal region, no seizure  On home dilantin 400mg  daily  Dilantin level 18.0   Now on dilantin 200mg  bid - continue   Hypertension  Home meds: hydralazine 25 mg, labetalol 100 mg, lisinopril 20 mg  Stable . Gradually normalize in 2-3 days . Long-term BP goal normotensive  Hyperlipidemia  Home meds: Lipitor 40 mg  LDL 73, goal < 70  lipitor 40 resumed  Continue statin at discharge  Tobacco abuse  Current smoker  Smoking cessation counseling was  provided   Pt verbally agreed to quit   Other Stroke Risk Factors  Advanced Age >/= 45   Vascular dementia - disorientation, likely baseline   Hospital day # 1  Neurology will sign off. Please call with questions. Pt will follow up with stroke clinic NP at [facility] in about 4 weeks. Thanks for the consult.  , MD PhD Stroke Neurology 12/12/2020 1:33 PM   To contact Stroke Continuity provider, please refer to [facility]. After hours, contact General Neurology

## 2020-12-13 DIAGNOSIS — I10 Essential (primary) hypertension: Secondary | ICD-10-CM | POA: Diagnosis not present

## 2020-12-13 LAB — TSH: TSH: 2.91 u[IU]/mL (ref 0.350–4.500)

## 2020-12-13 LAB — CBC
HCT: 43.8 % (ref 39.0–52.0)
Hemoglobin: 15 g/dL (ref 13.0–17.0)
MCH: 32.8 pg (ref 26.0–34.0)
MCHC: 34.2 g/dL (ref 30.0–36.0)
MCV: 95.8 fL (ref 80.0–100.0)
Platelets: 193 10*3/uL (ref 150–400)
RBC: 4.57 MIL/uL (ref 4.22–5.81)
RDW: 11.5 % (ref 11.5–15.5)
WBC: 3.3 10*3/uL — ABNORMAL LOW (ref 4.0–10.5)
nRBC: 0 % (ref 0.0–0.2)

## 2020-12-13 LAB — BASIC METABOLIC PANEL
Anion gap: 12 (ref 5–15)
BUN: 12 mg/dL (ref 8–23)
CO2: 22 mmol/L (ref 22–32)
Calcium: 8.9 mg/dL (ref 8.9–10.3)
Chloride: 101 mmol/L (ref 98–111)
Creatinine, Ser: 0.89 mg/dL (ref 0.61–1.24)
GFR, Estimated: >60 mL/min (ref >60)
Glucose, Bld: 75 mg/dL (ref 70–99)
Potassium: 3.6 mmol/L (ref 3.5–5.1)
Sodium: 135 mmol/L (ref 135–145)

## 2020-12-13 LAB — VITAMIN B12: Vitamin B-12: 544 pg/mL (ref 180–914)

## 2020-12-13 LAB — MAGNESIUM: Magnesium: 1.8 mg/dL (ref 1.7–2.4)

## 2020-12-13 MED ORDER — SENNA 8.6 MG PO TABS
1.0000 | ORAL_TABLET | Freq: Every day | ORAL | Status: DC
  Administered 2020-12-13 – 2020-12-18 (×4): 8.6 mg via ORAL
  Filled 2020-12-13 (×4): qty 1

## 2020-12-13 MED ORDER — HYDRALAZINE HCL 20 MG/ML IJ SOLN
5.0000 mg | Freq: Once | INTRAMUSCULAR | Status: AC
  Administered 2020-12-13: 5 mg via INTRAVENOUS
  Filled 2020-12-13: qty 1

## 2020-12-13 MED ORDER — POLYETHYLENE GLYCOL 3350 17 G PO PACK
17.0000 g | PACK | Freq: Two times a day (BID) | ORAL | Status: DC
  Administered 2020-12-13 – 2020-12-19 (×8): 17 g via ORAL
  Filled 2020-12-13 (×9): qty 1

## 2020-12-13 MED ORDER — HYDRALAZINE HCL 20 MG/ML IJ SOLN: 5.0000 mg | Freq: Three times a day (TID) | INTRAMUSCULAR | Status: DC | PRN

## 2020-12-13 NOTE — Progress Notes (Signed)
PROGRESS NOTE    Philip Robinson  ZOX:[id number] DOB: 06/10/1951 DOA: 12/10/2020 PCP: Hermelinda Delleninges, Marie Moore, NP   Brief Narrative: 70 year old with past medical history significant for non-insulin-dependent diabetes, hypertension, AAA, seizure disorder, vascular dementia, tobacco abuse recent admission for breakthrough seizure who was brought to the ED via EMS with weakness, worsening left leg weakness, stool incontinence and altered mental over  usual baseline per family report.  In the ED, only oriented to self/ place and was unable to articulate why  he was brought to the ED.  MRI obtained which showed watershed infarcts in the right hemisphere.  Neurology was consulted.  Full stroke work-up initiated and showed right ICA stenosis.  Vascular surgery considering intervention at the end of the week.  Will need SNF after.   Assessment & Plan:   Principal Problem:   Acute CVA (cerebrovascular accident) [facility](HCC) Active Problems:   Seizure (HCC)   Essential hypertension   Ascending aortic aneurysm (HCC)   Vascular dementia (HCC)   Nicotine abuse   Diabetes mellitus without complication (HCC)   Overweight (BMI 25.0-29.9)   1-Acute cerebrovascular accident, present on admission, MRI significant for a small acute infarct of the watershed area in the right cerebral hemisphere. Thought to be related to right ICA stenosis seen on CTA. Echo: Ejection fraction 60 to 65% grade 1 diastolic dysfunction. Continue with aspirin Nephrology signed off  2-Right internal carotid artery stenosis, present on admission, CTA shows severe atheromatous  stenosis of the right ICA bulb with short segment string sign. Vascular surgery consulted and plan is for TCAR probably Wednesday. She was restarted on aspirin and Plavix. Continue with a statin.  3-seizure disorder: Continue with Dilantin twice a day as recommended by neurology. Could do 150 mg IV 3 times daily if patient unable to tolerate oral.    Hypertension: Permissive hypertension initially after acute stroke. Discussed with neurology due to ICA stenosis blood pressure range should be 130--to 160.  Will use as needed as needed for systolic blood pressure more than 190. After surgery, long-term she will be normalization of blood pressure.  Vascular dementia: Continue with Aricept  HLD: Continue restarting  Diabetes type 2, non-insulin-dependent: Hemoglobin A1c 6.1 in 03/08/2019 Hemoglobin A1c 5.3  Tobacco abuse: Continue with nicotine patch  Ascending aortic aneurysm: CT angiogram of the neck showed dilation of the aortic up to 4 cm Need outpatient follow-up and/or surveillance  Nutritional status overweight: BMI 28   Estimated body mass index is 28.11 kg/m as calculated from the following:   Height as of this encounter: 6' (1.829 m).   Weight as of this encounter: 94 kg.   DVT prophylaxis: Lovenox Code Status: Full code Family Communication: No family at bedside Disposition Plan:  Status is: Inpatient  Remains inpatient appropriate because:Ongoing diagnostic testing needed not appropriate for outpatient work up   Dispo: The patient is from: Home              Anticipated d/c is to: SNF              Anticipated d/c date is: 3 days              Patient currently is not medically stable to d/c.  Plan for TCAR  on Wednesday        Consultants:   Neurology  Vascular  Procedures:   Echo see report above  Antimicrobials:    Subjective: Patient is alert oriented to person, denies pain.  Pleasantly confused  Objective: Vitals:  12/12/20 1500 12/12/20 2200 12/12/20 2328 12/13/20 0431  BP: (!) 167/80 (!) 192/101 (!) 172/93 (!) 150/100  Pulse: (!) 51 (!) 56 63 87  Resp: 16   20  Temp: (!) 97.5 F (36.4 C) 98.7 F (37.1 C)  (!) 97.5 F (36.4 C)  TempSrc: Oral Oral  Oral  SpO2: 100%   100%  Weight:      Height:        Intake/Output Summary (Last 24 hours) at 12/13/2020 0916 Last data filed  at 12/12/2020 1015 Gross per 24 hour  Intake 120 ml  Output -  Net 120 ml   Filed Weights   12/10/20 1830  Weight: 94 kg    Examination:  General exam: Appears calm and comfortable  Respiratory system: Clear to auscultation. Respiratory effort normal. Cardiovascular system: S1 & S2 heard, RRR. No JVD, murmurs, rubs, gallops or clicks. No pedal edema. Gastrointestinal system: Abdomen is nondistended, soft and nontender. No organomegaly or masses felt. Normal bowel sounds heard. Central nervous system: Alert and oriented time 1.   Extremities: no edema    Data Reviewed: I have personally reviewed following labs and imaging studies  CBC: Recent Labs  Lab 12/10/20 1937 12/13/20 0748  WBC 4.5 3.3*  NEUTROABS 2.6  --   HGB 14.9 15.0  HCT 45.6 43.8  MCV 101.8* 95.8  PLT 206 193   Basic Metabolic Panel: Recent Labs  Lab 12/10/20 1937 12/13/20 0748  NA 141 135  K 4.5 3.6  CL 103 101  CO2 26 22  GLUCOSE 106* 75  BUN 18 12  CREATININE 1.06 0.89  CALCIUM 9.2 8.9  MG  --  1.8   GFR: Estimated Creatinine Clearance: 90.7 mL/min (by C-G formula based on SCr of 0.89 mg/dL). Liver Function Tests: Recent Labs  Lab 12/10/20 1937  AST 18  ALT 15  ALKPHOS 98  BILITOT 0.8  PROT 7.5  ALBUMIN 3.8   No results for input(s): LIPASE, AMYLASE in the last 168 hours. Recent Labs  Lab 12/10/20 1937  AMMONIA 30   Coagulation Profile: No results for input(s): INR, PROTIME in the last 168 hours. Cardiac Enzymes: No results for input(s): CKTOTAL, CKMB, CKMBINDEX, TROPONINI in the last 168 hours. BNP (last 3 results) No results for input(s): PROBNP in the last 8760 hours. HbA1C: Recent Labs    12/12/20 0139  HGBA1C 5.3   CBG: Recent Labs  Lab 12/11/20 2357 12/12/20 0409 12/12/20 0821 12/12/20 1221 12/12/20 1628  GLUCAP 118* 71 75 106* 143*   Lipid Profile: Recent Labs    12/12/20 0139  CHOL 128  HDL 38*  LDLCALC 73  TRIG 83  CHOLHDL 3.4   Thyroid  Function Tests: No results for input(s): TSH, T4TOTAL, FREET4, T3FREE, THYROIDAB in the last 72 hours. Anemia Panel: No results for input(s): VITAMINB12, FOLATE, FERRITIN, TIBC, IRON, RETICCTPCT in the last 72 hours. Sepsis Labs: No results for input(s): PROCALCITON, LATICACIDVEN in the last 168 hours.  Recent Results (from the past 240 hour(s))  SARS CORONAVIRUS 2 (TAT 6-24 HRS) Nasopharyngeal Nasopharyngeal Swab     Status: None   Collection Time: 12/05/20 12:50 AM   Specimen: Nasopharyngeal Swab  Result Value Ref Range Status   SARS Coronavirus 2 NEGATIVE NEGATIVE Final    Comment: (NOTE) SARS-CoV-2 target nucleic acids are NOT DETECTED.  The SARS-CoV-2 RNA is generally detectable in upper and lower respiratory specimens during the acute phase of infection. Negative results do not preclude SARS-CoV-2 infection, do not rule out co-infections  with other pathogens, and should not be used as the sole basis for treatment or other patient management decisions. Negative results must be combined with clinical observations, patient history, and epidemiological information. The expected result is Negative.  Fact Sheet for Patients: HairSlick.no  Fact Sheet for Healthcare Providers: quierodirigir.com  This test is not yet approved or cleared by the [place] FDA and  has been authorized for detection and/or diagnosis of SARS-CoV-2 by FDA under an Emergency Use Authorization (EUA). This EUA will remain  in effect (meaning this test can be used) for the duration of the COVID-19 declaration under Se ction 564(b)(1) of the Act, 21 U.S.C. section 360bbb-3(b)(1), unless the authorization is terminated or revoked sooner.  Performed at [facility], [address], [place], [place] [address]   Urine Culture     Status: Abnormal   Collection Time: 12/06/20 10:38 AM   Specimen: Urine, Clean Catch  Result Value Ref Range Status    Specimen Description URINE, CLEAN CATCH  Final   Special Requests NONE  Final   Culture (A)  Final    <10,000 COLONIES/mL INSIGNIFICANT GROWTH Performed at [facility], [address], [place], [place] [address]    Report Status 12/07/2020 FINAL  Final  Resp Panel by RT-PCR (Flu A&B, Covid) Nasopharyngeal Swab     Status: None   Collection Time: 12/11/20 12:04 AM   Specimen: Nasopharyngeal Swab; Nasopharyngeal(NP) swabs in vial transport medium  Result Value Ref Range Status   SARS Coronavirus 2 by RT PCR NEGATIVE NEGATIVE Final    Comment: (NOTE) SARS-CoV-2 target nucleic acids are NOT DETECTED.  The SARS-CoV-2 RNA is generally detectable in upper respiratory specimens during the acute phase of infection. The lowest concentration of SARS-CoV-2 viral copies this assay can detect is 138 copies/mL. A negative result does not preclude SARS-Cov-2 infection and should not be used as the sole basis for treatment or other patient management decisions. A negative result may occur with  improper specimen collection/handling, submission of specimen other than nasopharyngeal swab, presence of viral mutation(s) within the areas targeted by this assay, and inadequate number of viral copies(<138 copies/mL). A negative result must be combined with clinical observations, patient history, and epidemiological information. The expected result is Negative.  Fact Sheet for Patients:  BloggerCourse.com  Fact Sheet for Healthcare Providers:  SeriousBroker.it  This test is no t yet approved or cleared by the [place] FDA and  has been authorized for detection and/or diagnosis of SARS-CoV-2 by FDA under an Emergency Use Authorization (EUA). This EUA will remain  in effect (meaning this test can be used) for the duration of the COVID-19 declaration under Section 564(b)(1) of the Act, 21 U.S.C.section 360bbb-3(b)(1), unless the authorization  is terminated  or revoked sooner.       Influenza A by PCR NEGATIVE NEGATIVE Final   Influenza B by PCR NEGATIVE NEGATIVE Final    Comment: (NOTE) The Xpert Xpress SARS-CoV-2/FLU/RSV plus assay is intended as an aid in the diagnosis of influenza from Nasopharyngeal swab specimens and should not be used as a sole basis for treatment. Nasal washings and aspirates are unacceptable for Xpert Xpress SARS-CoV-2/FLU/RSV testing.  Fact Sheet for Patients: BloggerCourse.com  Fact Sheet for Healthcare Providers: SeriousBroker.it  This test is not yet approved or cleared by the [place] FDA and has been authorized for detection and/or diagnosis of SARS-CoV-2 by FDA under an Emergency Use Authorization (EUA). This EUA will remain in effect (meaning this test  can be used) for the duration of the COVID-19 declaration under Section 564(b)(1) of the Act, 21 U.S.C. section 360bbb-3(b)(1), unless the authorization is terminated or revoked.  Performed at [facility], [address], [place], [place] [address]          Radiology Studies: ECHOCARDIOGRAM COMPLETE  Result Date: 12/11/2020    ECHOCARDIOGRAM REPORT   Patient Name:   Philip Robinson Date of Exam: 12/11/2020 Medical Rec #:  [id number]      Height:       72.0 in Accession #:    [id number]     Weight:       207.2 lb Date of Birth:  02/15/1951       BSA:          2.163 m Patient Age:    69 years       BP:           146/85 mmHg Patient Gender: M              HR:           55 bpm. Exam Location:  Inpatient Procedure: 2D Echo, Cardiac Doppler and Color Doppler Indications:    Stroke 434.91 / I163.9  History:        Patient has no prior history of Echocardiogram examinations.                 Risk Factors:Hypertension and Diabetes.  Sonographer:    Tiffany Dance Referring Phys: [id number] RONDELL A SMITH IMPRESSIONS  1. Left ventricular ejection fraction, by estimation, is 60 to 65%. The left  ventricle has normal function. The left ventricle has no regional wall motion abnormalities. There is moderate concentric left ventricular hypertrophy. Left ventricular diastolic parameters are consistent with Grade I diastolic dysfunction (impaired relaxation). Elevated left ventricular end-diastolic pressure.  2. Right ventricular systolic function is normal. The right ventricular size is normal.  3. Left atrial size was mildly dilated.  4. The mitral valve is normal in structure. No evidence of mitral valve regurgitation. No evidence of mitral stenosis.  5. The aortic valve is calcified. There is moderate calcification of the aortic valve. There is moderate thickening of the aortic valve. Aortic valve regurgitation is not visualized. No aortic stenosis is present.  6. Aortic dilatation noted. There is mild dilatation at the level of the sinuses of Valsalva, measuring 43 mm. There is mild dilatation of the ascending aorta, measuring 41 mm.  7. The inferior vena cava is normal in size with greater than 50% respiratory variability, suggesting right atrial pressure of 3 mmHg. FINDINGS  Left Ventricle: Left ventricular ejection fraction, by estimation, is 60 to 65%. The left ventricle has normal function. The left ventricle has no regional wall motion abnormalities. The left ventricular internal cavity size was normal in size. There is  moderate concentric left ventricular hypertrophy. Left ventricular diastolic parameters are consistent with Grade I diastolic dysfunction (impaired relaxation). Elevated left ventricular end-diastolic pressure. Right Ventricle: The right ventricular size is normal. No increase in right ventricular wall thickness. Right ventricular systolic function is normal. Left Atrium: Left atrial size was mildly dilated. Right Atrium: Right atrial size was normal in size. Pericardium: There is no evidence of pericardial effusion. Mitral Valve: The mitral valve is normal in structure. No evidence of  mitral valve regurgitation. No evidence of mitral valve stenosis. Tricuspid Valve: The tricuspid valve is normal in structure. Tricuspid valve regurgitation is not demonstrated. No evidence of tricuspid stenosis. Aortic Valve:  The aortic valve is calcified. There is moderate calcification of the aortic valve. There is moderate thickening of the aortic valve. Aortic valve regurgitation is not visualized. No aortic stenosis is present. Pulmonic Valve: The pulmonic valve was normal in structure. Pulmonic valve regurgitation is not visualized. No evidence of pulmonic stenosis. Aorta: Aortic dilatation noted. There is mild dilatation at the level of the sinuses of Valsalva, measuring 43 mm. There is mild dilatation of the ascending aorta, measuring 41 mm. Venous: The inferior vena cava is normal in size with greater than 50% respiratory variability, suggesting right atrial pressure of 3 mmHg. IAS/Shunts: No atrial level shunt detected by color flow Doppler.  LEFT VENTRICLE PLAX 2D LVIDd:         4.80 cm  Diastology LVIDs:         2.90 cm  LV e' medial:    2.72 cm/s LV PW:         1.40 cm  LV E/e' medial:  19.0 LV IVS:        1.40 cm  LV e' lateral:   4.35 cm/s LVOT diam:     2.30 cm  LV E/e' lateral: 11.9 LV SV:         87 LV SV Index:   40 LVOT Area:     4.15 cm  RIGHT VENTRICLE            IVC RV Basal diam:  2.10 cm    IVC diam: 1.90 cm RV S prime:     8.70 cm/s TAPSE (M-mode): 2.5 cm LEFT ATRIUM             Index       RIGHT ATRIUM           Index LA diam:        3.40 cm 1.57 cm/m  RA Area:     14.80 cm LA Vol (A2C):   64.9 ml 30.01 ml/m RA Volume:   39.20 ml  18.12 ml/m LA Vol (A4C):   41.2 ml 19.05 ml/m LA Biplane Vol: 54.9 ml 25.38 ml/m  AORTIC VALVE LVOT Vmax:   101.00 cm/s LVOT Vmean:  73.300 cm/s LVOT VTI:    0.210 m  AORTA Ao Root diam: 4.30 cm Ao Asc diam:  4.10 cm MITRAL VALVE MV Area (PHT): 2.09 cm    SHUNTS MV Decel Time: 363 msec    Systemic VTI:  0.21 m MV E velocity: 51.80 cm/s  Systemic Diam:  2.30 cm MV A velocity: 72.80 cm/s MV E/A ratio:  0.71 Chilton Si MD Electronically signed by Chilton Si MD Signature Date/Time: 12/11/2020/3:43:17 PM    Final         Scheduled Meds: . aspirin  325 mg Oral Daily  . atorvastatin  40 mg Oral QHS  . clopidogrel  75 mg Oral Daily  . donepezil  5 mg Oral Q lunch  . enoxaparin (LOVENOX) injection  40 mg Subcutaneous Q24H  . mirabegron ER  50 mg Oral Q lunch  . mirtazapine  7.5 mg Oral Q lunch  . nicotine  14 mg Transdermal Daily  . phenytoin  200 mg Oral BID  . sertraline  100 mg Oral Q lunch  . tamsulosin  0.4 mg Oral Q lunch   Continuous Infusions:   LOS: 2 days    Time spent: 35 minutes    Elfreida Heggs A Makalya Nave, MD [facility]   If 7PM-7AM, please contact night-coverage [email or web address]  12/13/2020, 9:16 AM

## 2020-12-13 NOTE — Evaluation (Addendum)
Occupational Therapy Evaluation Patient Details Name: Philip Robinson MRN: [id number] DOB: 1951-07-27 Today's Date: 12/13/2020    History of Present Illness Pt is a 70 y.o. male with a medical hx significant for seizures, HTN, dementia, stroke anuerysm, and DM2 who presents with generalized weakness especially on the L side, increased confusion, and difficulty standing since d/c 12/06/20 from recent hospitalization 2/2 seizures. MRI revealed small acute infarcts of the watershed area of the R cerebral hemisphere. CTA showed R ICA stenosis.   Clinical Impression   Per chart review, pt was living with his daughter and performed BADLs and used [facility] for mobility. Pt presenting with decreased cognition (reporting he lives with his wife and is working "with big machines"), balance, strength, and activity tolerance. Despite confusion, pt very motivated to participate in therapy and following simple commands. Pt performing UB ADLs with Min A, LB ADLs with Min A, and functional mobility with Min A +2 and RW. Pt would benefit from further acute OT to facilitate safe dc. Recommend dc to CIR for further OT to optimize safety, independence with ADLs, and return to PLOF.     Follow Up Recommendations  CIR;Supervision/Assistance - 24 hour    Equipment Recommendations  Other (comment) (Defer to next venue)    Recommendations for Other Services PT consult     Precautions / Restrictions Precautions Precautions: Fall Precaution Comments: seizures; dementia Restrictions Weight Bearing Restrictions: No      Mobility Bed Mobility Overal bed mobility: Needs Assistance Bed Mobility: Supine to Sit     Supine to sit: Min guard     General bed mobility comments: MIn Guard A for safety. Pt following simple cues. Needs to be given time to move at his own pace    Transfers Overall transfer level: Needs assistance Equipment used: Rolling walker (2 wheeled) Transfers: Sit to/from Stand Sit to Stand: Min  assist;+2 safety/equipment         General transfer comment: strong posterior lean, min A +2 for fwd wt shift. vc's for hand placement    Balance Overall balance assessment: Needs assistance Sitting-balance support: Single extremity supported;Feet supported Sitting balance-Leahy Scale: Fair Sitting balance - Comments: pt able to sit without support of hands Postural control: Posterior lean Standing balance support: Bilateral upper extremity supported;During functional activity Standing balance-Leahy Scale: Poor Standing balance comment: Strong posterior lean resulting in maxAx1 with intermittent minAx1 to maintain standing balance.                           ADL either performed or assessed with clinical judgement   ADL Overall ADL's : Needs assistance/impaired Eating/Feeding: Set up;Sitting   Grooming: Wash/dry face;Supervision/safety;Set up;Sitting   Upper Body Bathing: Minimal assistance;Sitting   Lower Body Bathing: Minimal assistance;Sit to/from stand   Upper Body Dressing : Minimal assistance;Sitting   Lower Body Dressing: Minimal assistance;Sit to/from stand;+2 for safety/equipment;+2 for physical assistance Lower Body Dressing Details (indicate cue type and reason): Pt pulling up his socks while sitting at EOB. Min A +2 for standing balance Toilet Transfer: Minimal assistance;+2 for physical assistance;+2 for safety/equipment           Functional mobility during ADLs: Minimal assistance;+2 for safety/equipment;Rolling walker General ADL Comments: Pt presenting with decreased balance, cognition, safety, and strength.     Vision Baseline Vision/History: No visual deficits       Perception     Praxis      Pertinent Vitals/Pain Pain Assessment: Faces Faces Pain Scale:  No hurt Pain Intervention(s): Monitored during session     Hand Dominance Right   Extremity/Trunk Assessment Upper Extremity Assessment Upper Extremity Assessment: Generalized  weakness   Lower Extremity Assessment Lower Extremity Assessment: Defer to PT evaluation   Cervical / Trunk Assessment Cervical / Trunk Assessment: Kyphotic   Communication Communication Communication: No difficulties   Cognition Arousal/Alertness: Awake/alert Behavior During Therapy: WFL for tasks assessed/performed Overall Cognitive Status: History of cognitive impairments - at baseline Area of Impairment: Orientation;Attention;Memory;Following commands;Safety/judgement;Awareness;Problem solving                 Orientation Level: Disoriented to;Place;Time;Situation (Year [id number]) Current Attention Level: Selective Memory: Decreased short-term memory;Decreased recall of precautions Following Commands: Follows one step commands with increased time;Follows multi-step commands inconsistently Safety/Judgement: Decreased awareness of safety;Decreased awareness of deficits Awareness: Intellectual Problem Solving: Slow processing;Decreased initiation;Difficulty sequencing;Requires verbal cues;Requires tactile cues General Comments: Pt with baseline dementia. Upon arrival, pt awake and very agreeable to participate. Following simple commands. Pt continues to present with dereased orientation as he reports he lives at home with wife and still works.   General Comments  HR 112    Exercises     Shoulder Instructions      Home Living Family/patient expects to be discharged to:: Private residence Living Arrangements: Children (Daughter and her fiance) Available Help at Discharge: Family;Available PRN/intermittently Type of Home: House Home Access: Stairs to enter [employer] of Steps: 3 Entrance Stairs-Rails: Right;Left Home Layout: One level     Bathroom Shower/Tub: Chief Strategy Officer: Standard Bathroom Accessibility: Yes   Home Equipment: Environmental consultant - 4 wheels;Cane - single point;Shower seat   Additional Comments: Pt reporting he lives with his wife  and is still working. Information above from PT evaluation; collected via phone call.      Prior Functioning/Environment Level of Independence: Independent with assistive device(s)        Comments: Independent with use of SPC or daughter's rollator for mobility within home, rarely goes into community other than for appointments. Independent with use of AD/AE for grooming self, self hygeine, and bathing but pt did not drive, cook, or clean.        OT Problem List: Decreased strength;Decreased range of motion;Decreased activity tolerance;Impaired balance (sitting and/or standing);Decreased cognition;Decreased safety awareness;Decreased knowledge of use of DME or AE;Decreased knowledge of precautions      OT Treatment/Interventions: Self-care/ADL training;Therapeutic exercise;DME and/or AE instruction;Energy conservation;Therapeutic activities;Patient/family education    OT Goals(Current goals can be found in the care plan section) Acute Rehab OT Goals Patient Stated Goal: did not state OT Goal Formulation: With patient Time For Goal Achievement: 12/27/20 Potential to Achieve Goals: Good  OT Frequency: Min 2X/week   Barriers to D/C:            Co-evaluation PT/OT/SLP Co-Evaluation/Treatment: Yes Reason for Co-Treatment: Complexity of the patient's impairments (multi-system involvement);Necessary to address cognition/behavior during functional activity;For patient/therapist safety PT goals addressed during session: Mobility/safety with mobility;Balance;Proper use of DME;Strengthening/ROM OT goals addressed during session: ADL's and self-care      AM-PAC OT "6 Clicks" Daily Activity     Outcome Measure Help from another person eating meals?: None Help from another person taking care of personal grooming?: A Little Help from another person toileting, which includes using toliet, bedpan, or urinal?: A Lot Help from another person bathing (including washing, rinsing, drying)?: A  Lot Help from another person to put on and taking off regular upper body clothing?: A Little Help from another person  to put on and taking off regular lower body clothing?: A Lot 6 Click Score: 16   End of Session Equipment Utilized During Treatment: Rolling walker;Gait belt Nurse Communication: Mobility status  Activity Tolerance: Patient tolerated treatment well Patient left: in chair;with call bell/phone within reach;with chair alarm set  OT Visit Diagnosis: Unsteadiness on feet (R26.81);Other abnormalities of gait and mobility (R26.89);Muscle weakness (generalized) (M62.81)                Time: 6045-4098 OT Time Calculation (min): 25 min Charges:  OT General Charges $OT Visit: 1 Visit OT Evaluation $OT Eval Moderate Complexity: 1 Mod  India Jolin MSOT, OTR/L Acute Rehab Pager: [phone] Office: [phone]  Theodoro Grist Tiffanye Hartmann 12/13/2020, 1:19 PM

## 2020-12-13 NOTE — Progress Notes (Addendum)
  Progress Note    12/13/2020 8:40 AM Hospital Day 2  Subjective:  No complaints    Vitals:   12/12/20 2328 12/13/20 0431  BP: (!) 172/93 (!) 150/100  Pulse: 63 87  Resp:  20  Temp:  (!) 97.5 F (36.4 C)  SpO2:  100%    Physical Exam: General:  No distress Lungs:  Non labored Neuro:  Moving all extremities equally; tongue is midline   CBC    Component Value Date/Time   WBC 4.5 12/10/2020 1937   RBC 4.48 12/10/2020 1937   HGB 14.9 12/10/2020 1937   HCT 45.6 12/10/2020 1937   PLT 206 12/10/2020 1937   MCV 101.8 (H) 12/10/2020 1937   MCH 33.3 12/10/2020 1937   MCHC 32.7 12/10/2020 1937   RDW 12.1 12/10/2020 1937   LYMPHSABS 1.0 12/10/2020 1937   MONOABS 0.6 12/10/2020 1937   EOSABS 0.2 12/10/2020 1937   BASOSABS 0.0 12/10/2020 1937    BMET    Component Value Date/Time   NA 141 12/10/2020 1937   K 4.5 12/10/2020 1937   CL 103 12/10/2020 1937   CO2 26 12/10/2020 1937   GLUCOSE 106 (H) 12/10/2020 1937   BUN 18 12/10/2020 1937   CREATININE 1.06 12/10/2020 1937   CALCIUM 9.2 12/10/2020 1937   GFRNONAA >60 12/10/2020 1937   GFRAA >60 07/29/2020 1535    INR No results found for: INR   Intake/Output Summary (Last 24 hours) at 12/13/2020 0840 Last data filed at 12/12/2020 1015 Gross per 24 hour  Intake 120 ml  Output --  Net 120 ml     Assessment/Plan:  70 y.o. male with symptomatic high grade right ICA stenosis Hospital Day 2  -pt neuro in tact this am.  Plan is for right TCAR later this week.  -dual antiplatelet therapy, which he is on as well as statin.   Doreatha Massed, PA-C [facility] [phone] 12/13/2020 8:40 AM  I have seen and evaluated the patient. I agree with the PA note as documented above.  70 year old male seen in consultation over the weekend with a high-grade proximal right ICA stenosis with right brain event.  Given symptomatic high-grade stenosis with tandem intracranial stenosis I think he would be a  candidate for TCAR with carotid stent.  Plan later this week likely Wednesday.  Continue aspirin Plavix as well as statin.  No new neuro events overnight.  Cephus Shelling, MD [facility] [place] Office: [phone]

## 2020-12-13 NOTE — Progress Notes (Signed)
Physical Therapy Treatment Patient Details Name: Philip Robinson MRN: [id number] DOB: March 26, 1951 Today's Date: 12/13/2020    History of Present Illness Pt is a 70 y.o. male with a medical hx significant for seizures, HTN, dementia, stroke anuerysm, and DM2 who presents with generalized weakness especially on the L side, increased confusion, and difficulty standing since d/c 12/06/20 from recent hospitalization 2/2 seizures. MRI revealed small acute infarcts of the watershed area of the R cerebral hemisphere. CTA showed R ICA stenosis.    PT Comments    Pt progressing well today with mobility. Able to come to EOB with increased time and min-guard A. Needed +2 min A for sit to stand due to posterior lean but no buckling of LLE in standing today. Pt ambulated 40' with RW and min A +2 with chair brought behind. Pt with very narrow BOS, almost scissoring and flexed trunk. Due to pt progress, updating frequency and requesting CIR consult for possible return home with daughter. PT will continue to follow.    Follow Up Recommendations  Supervision/Assistance - 24 hour;CIR     Equipment Recommendations  3in1 (PT);Rolling walker with 5" wheels;Wheelchair (measurements PT);Wheelchair cushion (measurements PT)    Recommendations for Other Services Rehab consult     Precautions / Restrictions Precautions Precautions: Fall Precaution Comments: seizures; dementia Restrictions Weight Bearing Restrictions: No    Mobility  Bed Mobility Overal bed mobility: Needs Assistance Bed Mobility: Supine to Sit     Supine to sit: Min guard     General bed mobility comments: MIn Guard A for safety. Pt following simple cues. Needs to be given time to move at his own pace  Transfers Overall transfer level: Needs assistance Equipment used: Rolling walker (2 wheeled) Transfers: Sit to/from Stand Sit to Stand: Min assist;+2 safety/equipment         General transfer comment: strong posterior lean, min A +2  for fwd wt shift. vc's for hand placement  Ambulation/Gait Ambulation/Gait assistance: Min assist;+2 safety/equipment Gait Distance (Feet): 40 Feet Assistive device: Rolling walker (2 wheeled) Gait Pattern/deviations: Step-through pattern;Decreased step length - right;Decreased step length - left;Shuffle;Decreased weight shift to left;Leaning posteriorly;Trunk flexed;Narrow base of support;Decreased stance time - left Gait velocity: reduced Gait velocity interpretation: <1.31 ft/sec, indicative of household ambulator General Gait Details: pt with very narros BOS, occasionally stepping L foot on R. Able to correct minimall with visual and verbal cues. LLE fatigued quickly with ambulation decreasing safety with increasing distance. Chair brought behind   Stairs             Wheelchair Mobility    Modified Rankin (Stroke Patients Only) Modified Rankin (Stroke Patients Only) Pre-Morbid Rankin Score: Moderate disability Modified Rankin: Moderately severe disability     Balance Overall balance assessment: Needs assistance Sitting-balance support: Single extremity supported;Feet supported Sitting balance-Leahy Scale: Fair Sitting balance - Comments: pt able to sit without support of hands Postural control: Posterior lean Standing balance support: Bilateral upper extremity supported;During functional activity Standing balance-Leahy Scale: Poor Standing balance comment: posterior lean with initial standing, reliant on UE support                            Cognition Arousal/Alertness: Awake/alert Behavior During Therapy: WFL for tasks assessed/performed Overall Cognitive Status: History of cognitive impairments - at baseline Area of Impairment: Orientation;Attention;Memory;Following commands;Safety/judgement;Awareness;Problem solving                 Orientation Level: Disoriented to;Place;Time;Situation (Year [phone]) Current  Attention Level: Selective Memory:  Decreased short-term memory;Decreased recall of precautions Following Commands: Follows one step commands with increased time;Follows multi-step commands inconsistently Safety/Judgement: Decreased awareness of safety;Decreased awareness of deficits Awareness: Intellectual Problem Solving: Slow processing;Decreased initiation;Difficulty sequencing;Requires verbal cues;Requires tactile cues General Comments: Pt with baseline dementia. Upon arrival, pt awake and very agreeable to participate. Following simple commands. Pt continues to present with dereased orientation as he reports he lives at home with wife and still works.      Exercises      General Comments General comments (skin integrity, edema, etc.): HR 112 bpm      Pertinent Vitals/Pain Pain Assessment: Faces Faces Pain Scale: No hurt Pain Intervention(s): Monitored during session    Home Living Family/patient expects to be discharged to:: Private residence Living Arrangements: Children (Daughter and her fiance) Available Help at Discharge: Family;Available PRN/intermittently Type of Home: House Home Access: Stairs to enter Entrance Stairs-Rails: Right;Left Home Layout: One level Home Equipment: Environmental consultant - 4 wheels;Cane - single point;Shower seat Additional Comments: Pt reporting he lives with his wife and is still working. Information above from PT evaluation; collected via phone call.    Prior Function Level of Independence: Independent with assistive device(s)      Comments: Independent with use of SPC or daughter's rollator for mobility within home, rarely goes into community other than for appointments. Independent with use of AD/AE for grooming self, self hygeine, and bathing but pt did not drive, cook, or clean.   PT Goals (current goals can now be found in the care plan section) Acute Rehab PT Goals Patient Stated Goal: did not state PT Goal Formulation: With patient/family Time For Goal Achievement:  12/26/20 Potential to Achieve Goals: Good Progress towards PT goals: Progressing toward goals    Frequency    Min 4X/week      PT Plan Frequency needs to be updated;Discharge plan needs to be updated    Co-evaluation PT/OT/SLP Co-Evaluation/Treatment: Yes Reason for Co-Treatment: Complexity of the patient's impairments (multi-system involvement);Necessary to address cognition/behavior during functional activity;For patient/therapist safety PT goals addressed during session: Mobility/safety with mobility;Balance;Proper use of DME;Strengthening/ROM OT goals addressed during session: ADL's and self-care      AM-PAC PT "6 Clicks" Mobility   Outcome Measure  Help needed turning from your back to your side while in a flat bed without using bedrails?: A Little Help needed moving from lying on your back to sitting on the side of a flat bed without using bedrails?: A Little Help needed moving to and from a bed to a chair (including a wheelchair)?: A Lot Help needed standing up from a chair using your arms (e.g., wheelchair or bedside chair)?: A Lot Help needed to walk in hospital room?: A Lot Help needed climbing 3-5 steps with a railing? : Total 6 Click Score: 13    End of Session Equipment Utilized During Treatment: Gait belt Activity Tolerance: Patient tolerated treatment well Patient left: with call bell/phone within reach;in chair;with chair alarm set Nurse Communication: Mobility status PT Visit Diagnosis: Unsteadiness on feet (R26.81);Muscle weakness (generalized) (M62.81);Difficulty in walking, not elsewhere classified (R26.2);Other symptoms and signs involving the nervous system (R29.898)     Time: 1006-1030 PT Time Calculation (min) (ACUTE ONLY): 24 min  Charges:  $Gait Training: 8-22 mins                     Lyanne Co, PT  Acute Rehab Services  Pager [phone] Office [phone]    Benetta Spar L Rae Sutcliffe  12/13/2020, 1:21 PM

## 2020-12-13 NOTE — Progress Notes (Signed)
Discussed with patient's daughter plan for right TCAR on Wednesday in the operating room.  Please continue aspirin Plavix statin.  Patient's daughter is on board with surgery and wants to proceed.  Risks and benefits discussed.  Cephus Shelling, MD [facility] [place] Office: [phone]   Cephus Shelling

## 2020-12-13 NOTE — Care Management Important Message (Signed)
Important Message  Patient Details  Name: Philip Robinson MRN: [id number] Date of Birth: 25-Aug-1951   Medicare Important Message Given:  Yes     [name] 12/13/2020, 3:50 PM

## 2020-12-14 DIAGNOSIS — I10 Essential (primary) hypertension: Secondary | ICD-10-CM | POA: Diagnosis not present

## 2020-12-14 LAB — CBC
HCT: 42.3 % (ref 39.0–52.0)
Hemoglobin: 14.4 g/dL (ref 13.0–17.0)
MCH: 32.7 pg (ref 26.0–34.0)
MCHC: 34 g/dL (ref 30.0–36.0)
MCV: 96.1 fL (ref 80.0–100.0)
Platelets: 189 10*3/uL (ref 150–400)
RBC: 4.4 MIL/uL (ref 4.22–5.81)
RDW: 11.6 % (ref 11.5–15.5)
WBC: 3.6 10*3/uL — ABNORMAL LOW (ref 4.0–10.5)
nRBC: 0 % (ref 0.0–0.2)

## 2020-12-14 LAB — BASIC METABOLIC PANEL
Anion gap: 10 (ref 5–15)
BUN: 16 mg/dL (ref 8–23)
CO2: 25 mmol/L (ref 22–32)
Calcium: 8.9 mg/dL (ref 8.9–10.3)
Chloride: 102 mmol/L (ref 98–111)
Creatinine, Ser: 1.11 mg/dL (ref 0.61–1.24)
GFR, Estimated: >60 mL/min (ref >60)
Glucose, Bld: 88 mg/dL (ref 70–99)
Potassium: 3.8 mmol/L (ref 3.5–5.1)
Sodium: 137 mmol/L (ref 135–145)

## 2020-12-14 LAB — GLUCOSE, CAPILLARY
Glucose-Capillary: 97 mg/dL (ref 70–99)
Glucose-Capillary: 194 mg/dL — ABNORMAL HIGH (ref 70–99)
Glucose-Capillary: 133 mg/dL — ABNORMAL HIGH (ref 70–99)

## 2020-12-14 LAB — SARS CORONAVIRUS 2 BY RT PCR (HOSPITAL ORDER, PERFORMED IN ~~LOC~~ HOSPITAL LAB): SARS Coronavirus 2: NEGATIVE

## 2020-12-14 NOTE — Consult Note (Signed)
Physical Medicine and Rehabilitation Consult Reason for Consult: Left side weakness Referring Physician: Internal medicine   HPI: Philip Robinson is a 70 y.o. right-handed male with history of dementia maintained on Aricept diabetes mellitus hypertension and seizure disorder maintained on Dilantin as well as tobacco abuse.  Recent hospitalization 12/04/2020 to 12/06/2020 for prolonged seizures and Dilantin was adjusted.  Per chart review patient lives with her daughter perform basic ADLs and used a straight point cane for mobility.  Presented 12/11/2019 with left-sided weakness and altered mental status.  CT/MRI showed small acute infarct scattered along the deep watershed territory of the right cerebral hemisphere.  CT angiogram of head and neck severe stenosis of the right ICA bulb with short segment string sign.  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  Patient did not receive tPA.  Echocardiogram with ejection fraction of 60 to 65% no wall motion abnormalities grade 1 diastolic dysfunction.  Admission chemistries unremarkable, ammonia of 30, Dilantin level 18  Currently maintained on aspirin 325 mg daily and Plavix 75 mg daily for CVA prophylaxis.  Subcutaneous Lovenox for DVT prophylaxis.  Vascular surgery Dr. Chestine Spore follow-up for symptomatic high-grade right ICA stenosis undergoing transcarotid artery revascularization 12/15/2020.  Close monitoring of blood pressure patient had been receiving Cleviprex.  Therapy evaluations completed with recommendations of physical medicine rehab consult.   Review of Systems  Constitutional: Negative for chills and fever.  HENT: Negative for hearing loss.   Eyes: Negative for blurred vision and double vision.  Respiratory: Negative for cough and shortness of breath.   Cardiovascular: Positive for leg swelling. Negative for chest pain and palpitations.  Gastrointestinal: Positive for constipation. Negative for heartburn and  nausea.  Genitourinary: Negative for dysuria, flank pain and hematuria.  Musculoskeletal: Positive for myalgias.  Skin: Negative for rash.  Neurological: Positive for seizures and weakness.  Psychiatric/Behavioral: Positive for memory loss. The patient has insomnia.   All other systems reviewed and are negative.  Past Medical History:  Diagnosis Date  . Dementia (HCC)   . Diabetes mellitus without complication (HCC)   . Hypertension   . Seizure disorder [facility])    History reviewed. No pertinent surgical history. Family History  Family history unknown: Yes   Social History:  reports that he has been smoking. He has never used smokeless tobacco. He reports that he does not drink alcohol and does not use drugs. Allergies: No Known Allergies Medications Prior to Admission  Medication Sig Dispense Refill  . amLODipine (NORVASC) 10 MG tablet Take 10 mg by mouth daily with lunch.    Marland Kitchen atorvastatin (LIPITOR) 40 MG tablet Take 40 mg by mouth at bedtime.    . donepezil (ARICEPT) 5 MG tablet Take 5 mg by mouth daily with lunch.    . hydrALAZINE (APRESOLINE) 25 MG tablet Take 25 mg by mouth at bedtime.    Marland Kitchen labetalol (NORMODYNE) 100 MG tablet Take 0.5 tablets (50 mg total) by mouth 2 (two) times daily. (Patient taking differently: Take 100 mg by mouth daily with lunch.) 60 tablet 0  . lisinopril (ZESTRIL) 20 MG tablet Take 20 mg by mouth daily with lunch.    . mirabegron ER (MYRBETRIQ) 50 MG TB24 tablet Take 50 mg by mouth daily with lunch.    . mirtazapine (REMERON) 7.5 MG tablet Take 7.5 mg by mouth daily with lunch.    . phenytoin (DILANTIN) 200 MG ER capsule Take 2 capsules (400 mg total) by mouth daily. 60 capsule  1  . sertraline (ZOLOFT) 100 MG tablet Take 100 mg by mouth daily with lunch.    . tamsulosin (FLOMAX) 0.4 MG CAPS capsule Take 0.4 mg by mouth daily with lunch.      Home: Home Living Family/patient expects to be discharged to:: Private residence Living Arrangements: Children  (Daughter and her fiance) Available Help at Discharge: Family,Available PRN/intermittently Type of Home: House Home Access: Stairs to enter Secretary/administrator of Steps: 3 Entrance Stairs-Rails: Right,Left Home Layout: One level Bathroom Shower/Tub: Engineer, manufacturing systems: Standard Bathroom Accessibility: Yes Home Equipment: Walker - 4 wheels,Cane - single point,Shower seat Additional Comments: Pt reporting he lives with his wife and is still working. Information above from PT evaluation; collected via phone call.  Lives With: Daughter  Functional History: Prior Function Level of Independence: Independent with assistive device(s) Comments: Independent with use of SPC or daughter's rollator for mobility within home, rarely goes into community other than for appointments. Independent with use of AD/AE for grooming self, self hygeine, and bathing but pt did not drive, cook, or clean. Functional Status:  Mobility: Bed Mobility Overal bed mobility: Needs Assistance Bed Mobility: Supine to Sit Supine to sit: Min guard Sit to supine: Min assist,+2 for physical assistance General bed mobility comments: needed tactile cues to initiate movement but once going, was able to come to EOB without physical assist Transfers Overall transfer level: Needs assistance Equipment used: Rolling walker (2 wheeled) Transfers: Sit to/from Stand Sit to Stand: Mod assist Stand pivot transfers: Mod assist General transfer comment: mod A for power up due to posterior lean. Performed from bed and recliner Ambulation/Gait Ambulation/Gait assistance: Mod assist Gait Distance (Feet): 4 Feet Assistive device: Rolling walker (2 wheeled) Gait Pattern/deviations: Step-through pattern,Decreased step length - right,Decreased step length - left,Leaning posteriorly,Trunk flexed,Narrow base of support,Decreased stance time - left,Decreased weight shift to left General Gait Details: pt with heavier posterior  lean today. Worked on fwd wt shift before ambulating. Ambulated 2' then 4', further ambulation limited by safety concerns. Mod A to prevent posterior LOB Gait velocity: reduced Gait velocity interpretation: <1.31 ft/sec, indicative of household ambulator    ADL: ADL Overall ADL's : Needs assistance/impaired Eating/Feeding: Set up,Sitting Grooming: Wash/dry face,Supervision/safety,Set up,Sitting Upper Body Bathing: Minimal assistance,Sitting Lower Body Bathing: Minimal assistance,Sit to/from stand Upper Body Dressing : Minimal assistance,Sitting Lower Body Dressing: Minimal assistance,Sit to/from stand,+2 for safety/equipment,+2 for physical assistance Lower Body Dressing Details (indicate cue type and reason): Pt pulling up his socks while sitting at EOB. Min A +2 for standing balance Toilet Transfer: Minimal assistance,+2 for physical assistance,+2 for safety/equipment Functional mobility during ADLs: Minimal assistance,+2 for safety/equipment,Rolling walker General ADL Comments: Pt presenting with decreased balance, cognition, safety, and strength.  Cognition: Cognition Overall Cognitive Status: History of cognitive impairments - at baseline Arousal/Alertness: Awake/alert Orientation Level: Oriented to person,Disoriented to place,Disoriented to time,Disoriented to situation Attention: Focused Focused Attention: Appears intact Memory: Impaired Memory Impairment: Storage deficit,Retrieval deficit,Decreased recall of new information,Decreased short term memory Cognition Arousal/Alertness: Awake/alert Behavior During Therapy: WFL for tasks assessed/performed Overall Cognitive Status: History of cognitive impairments - at baseline Area of Impairment: Orientation,Attention,Memory,Following commands,Safety/judgement,Awareness,Problem solving Orientation Level: Disoriented to,Place,Time,Situation Current Attention Level: Selective Memory: Decreased short-term memory,Decreased recall of  precautions Following Commands: Follows one step commands with increased time,Follows multi-step commands inconsistently Safety/Judgement: Decreased awareness of safety,Decreased awareness of deficits Awareness: Intellectual Problem Solving: Slow processing,Decreased initiation,Difficulty sequencing,Requires verbal cues,Requires tactile cues General Comments: pt thinks he is from home with his wife and that his 2  children are young  Blood pressure (!) 147/108, pulse 80, temperature 98.5 F (36.9 C), temperature source Oral, resp. rate 18, height 6' (1.829 m), weight 94 kg, SpO2 100 %. Physical Exam Constitutional:      General: He is not in acute distress. HENT:     Head: Normocephalic.     Nose: Nose normal.     Mouth/Throat:     Mouth: Mucous membranes are moist.  Eyes:     Pupils: Pupils are equal, round, and reactive to light.  Neck:     Comments: Right carotid surgery dressing in place Cardiovascular:     Rate and Rhythm: Normal rate.     Pulses: Normal pulses.  Abdominal:     Palpations: Abdomen is soft.  Musculoskeletal:        General: No swelling or tenderness. Normal range of motion.     Cervical back: Normal range of motion.  Skin:    General: Skin is warm.  Neurological:     Mental Status: He is alert.     Comments: Pt is alert. Oriented to person only. Thought he was at home. Told me he lived in [place] with his wife. Followed basic commands. No focal CN abnl although speech sl dysarthric. UE motor grossly 4/5 perhaps sl weaker on left without PD. RLE: 4/5 prox to distal. LLE: 2+HF, 3/5 KE, ADF/APF but effort was inconsistent. Sensed pain in all 4's. DTR's 1+  Psychiatric:     Comments: Pleasantly confused     Results for orders placed or performed during the hospital encounter of 12/10/20 (from the past 24 hour(s))  SARS Coronavirus 2 by RT PCR (hospital order, performed in [facility] hospital lab)     Status: None   Collection Time: 12/14/20  3:20 AM   Result Value Ref Range   SARS Coronavirus 2 NEGATIVE NEGATIVE  CBC     Status: Abnormal   Collection Time: 12/14/20  8:12 AM  Result Value Ref Range   WBC 3.6 (L) 4.0 - 10.5 K/uL   RBC 4.40 4.22 - 5.81 MIL/uL   Hemoglobin 14.4 13.0 - 17.0 g/dL   HCT 57.8 46.9 - 62.9 %   MCV 96.1 80.0 - 100.0 fL   MCH 32.7 26.0 - 34.0 pg   MCHC 34.0 30.0 - 36.0 g/dL   RDW 52.8 41.3 - 24.4 %   Platelets 189 150 - 400 K/uL   nRBC 0.0 0.0 - 0.2 %  Basic metabolic panel     Status: None   Collection Time: 12/14/20  8:12 AM  Result Value Ref Range   Sodium 137 135 - 145 mmol/L   Potassium 3.8 3.5 - 5.1 mmol/L   Chloride 102 98 - 111 mmol/L   CO2 25 22 - 32 mmol/L   Glucose, Bld 88 70 - 99 mg/dL   BUN 16 8 - 23 mg/dL   Creatinine, Ser 0.10 0.61 - 1.24 mg/dL   Calcium 8.9 8.9 - 27.2 mg/dL   GFR, Estimated >53 >66 mL/min   Anion gap 10 5 - 15  Glucose, capillary     Status: None   Collection Time: 12/14/20  8:23 AM  Result Value Ref Range   Glucose-Capillary 97 70 - 99 mg/dL  Glucose, capillary     Status: Abnormal   Collection Time: 12/14/20 11:31 AM  Result Value Ref Range   Glucose-Capillary 194 (H) 70 - 99 mg/dL   No results found.   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 Does the need for close, 24 hr/day medical supervision in concert with the patient's rehab needs make it unreasonable for this patient to be served in a less intensive setting? Yes 2. Co-Morbidities requiring supervision/potential complications: DM, HTN, Sz disorder, dementia 3. Due to bladder management, bowel management, safety, skin/wound care, disease management, medication administration and patient education, does the patient require 24 hr/day rehab nursing? Yes 4. Does the patient require coordinated care of a physician, rehab nurse, therapy disciplines of PT, OT, SLP to address physical and functional deficits in the context of the  above medical diagnosis(es)? Yes Addressing deficits in the following areas: balance, endurance, locomotion, strength, transferring, bowel/bladder control, bathing, dressing, feeding, grooming, toileting, cognition, speech and psychosocial support 5. Can the patient actively participate in an intensive therapy program of at least 3 hrs of therapy per day at least 5 days per week? Yes 6. The potential for patient to make measurable gains while on inpatient rehab is excellent 7. Anticipated functional outcomes upon discharge from inpatient rehab are supervision  with PT, supervision with OT, supervision with SLP. 8. Estimated rehab length of stay to reach the above functional goals is: 13-18 days 9. Anticipated discharge destination: Home 10. Overall Rehab/Functional Prognosis: good  RECOMMENDATIONS: This patient's condition is appropriate for continued rehabilitative care in the following setting: CIR Patient has agreed to participate in recommended program. N/A Note that insurance prior authorization may be required for reimbursement for recommended care.  Comment: Will need someone at home at time of discharge. Rehab Admissions Coordinator to follow up.  Thanks,  Ranelle OysterZachary T. Ayelen Sciortino, MD, [place] DomFAAPMR  I have personally performed a face to face diagnostic evaluation of this patient. Additionally, I have examined pertinent labs and radiographic images. I have reviewed and concur with the physician assistant's documentation above.   Mcarthur RossettiDaniel J Angiulli, PA-C 12/14/2020

## 2020-12-14 NOTE — Progress Notes (Signed)
Physical Therapy Treatment Patient Details Name: Philip Robinson MRN: [id number] DOB: [birth year] Today's Date: 12/14/2020    History of Present Illness Pt is a 70 y.o. male with a medical hx significant for seizures, HTN, dementia, stroke anuerysm, and DM2 who presents with generalized weakness especially on the L side, increased confusion, and difficulty standing since d/c 12/06/20 from recent hospitalization 2/2 seizures. MRI revealed small acute infarcts of the watershed area of the R cerebral hemisphere. CTA showed R ICA stenosis.    PT Comments    Pt sitting in bed eating breakfast upon PT arrival, ate all of food on breakfast tray. Pt agreeable to treatment with encouragement. Pt able to come to EOB with tactile stimulation to initiate mvmt and then min-guard A. Pt continues with posterior lean with transfers and ambulation. Required mod A for sit<>stand and ambulation with RW. Continue to recommend CIR at d/c. PT will continue to follow.    Follow Up Recommendations  Supervision/Assistance - 24 hour;CIR     Equipment Recommendations  3in1 (PT);Rolling walker with 5" wheels;Wheelchair (measurements PT);Wheelchair cushion (measurements PT)    Recommendations for Other Services Rehab consult     Precautions / Restrictions Precautions Precautions: Fall Precaution Comments: seizures; dementia Restrictions Weight Bearing Restrictions: No    Mobility  Bed Mobility Overal bed mobility: Needs Assistance Bed Mobility: Supine to Sit     Supine to sit: Min guard     General bed mobility comments: needed tactile cues to initiate movement but once going, was able to come to EOB without physical assist  Transfers Overall transfer level: Needs assistance Equipment used: Rolling walker (2 wheeled) Transfers: Sit to/from Stand Sit to Stand: Mod assist Stand pivot transfers: Mod assist       General transfer comment: mod A for power up due to posterior lean. Performed from bed and  recliner  Ambulation/Gait Ambulation/Gait assistance: Mod assist Gait Distance (Feet): 4 Feet Assistive device: Rolling walker (2 wheeled) Gait Pattern/deviations: Step-through pattern;Decreased step length - right;Decreased step length - left;Leaning posteriorly;Trunk flexed;Narrow base of support;Decreased stance time - left;Decreased weight shift to left Gait velocity: reduced Gait velocity interpretation: <1.31 ft/sec, indicative of household ambulator General Gait Details: pt with heavier posterior lean today. Worked on fwd wt shift before ambulating. Ambulated 2' then 4', further ambulation limited by safety concerns. Mod A to prevent posterior LOB   Stairs             Wheelchair Mobility    Modified Rankin (Stroke Patients Only) Modified Rankin (Stroke Patients Only) Pre-Morbid Rankin Score: Moderate disability Modified Rankin: Moderately severe disability     Balance Overall balance assessment: Needs assistance Sitting-balance support: Single extremity supported;Feet supported Sitting balance-Leahy Scale: Fair Sitting balance - Comments: pt able to sit without support of hands Postural control: Posterior lean Standing balance support: Bilateral upper extremity supported;During functional activity Standing balance-Leahy Scale: Poor Standing balance comment: worked on anterior wt shift in standing within [employer] Arousal/Alertness: Awake/alert Behavior During Therapy: WFL for tasks assessed/performed Overall Cognitive Status: History of cognitive impairments - at baseline Area of Impairment: Orientation;Attention;Memory;Following commands;Safety/judgement;Awareness;Problem solving                 Orientation Level: Disoriented to;Place;Time;Situation Current Attention Level: Selective Memory: Decreased short-term memory;Decreased recall of precautions Following Commands: Follows one step commands with increased  time;Follows multi-step  commands inconsistently Safety/Judgement: Decreased awareness of safety;Decreased awareness of deficits Awareness: Intellectual Problem Solving: Slow processing;Decreased initiation;Difficulty sequencing;Requires verbal cues;Requires tactile cues General Comments: pt thinks he is from home with his wife and that his 2 children are young      Exercises General Exercises - Lower Extremity Ankle Circles/Pumps: AROM;Both;10 reps;Supine    General Comments General comments (skin integrity, edema, etc.): VSS. Pt left in recliner with chair alarm on      Pertinent Vitals/Pain Pain Assessment: No/denies pain Faces Pain Scale: No hurt    Home Living     Available Help at Discharge: Family;Available PRN/intermittently Type of Home: House              Prior Function            PT Goals (current goals can now be found in the care plan section) Acute Rehab PT Goals Patient Stated Goal: go home PT Goal Formulation: With patient/family Time For Goal Achievement: 12/26/20 Potential to Achieve Goals: Good Progress towards PT goals: Progressing toward goals    Frequency    Min 4X/week      PT Plan Current plan remains appropriate    Co-evaluation              AM-PAC PT "6 Clicks" Mobility   Outcome Measure  Help needed turning from your back to your side while in a flat bed without using bedrails?: A Little Help needed moving from lying on your back to sitting on the side of a flat bed without using bedrails?: A Little Help needed moving to and from a bed to a chair (including a wheelchair)?: A Lot Help needed standing up from a chair using your arms (e.g., wheelchair or bedside chair)?: A Lot Help needed to walk in hospital room?: A Lot Help needed climbing 3-5 steps with a railing? : Total 6 Click Score: 13    End of Session Equipment Utilized During Treatment: Gait belt Activity Tolerance: Patient tolerated treatment well Patient  left: with call bell/phone within reach;in chair;with chair alarm set Nurse Communication: Mobility status PT Visit Diagnosis: Unsteadiness on feet (R26.81);Muscle weakness (generalized) (M62.81);Difficulty in walking, not elsewhere classified (R26.2);Other symptoms and signs involving the nervous system (R29.898)     Time: 1005-1026 PT Time Calculation (min) (ACUTE ONLY): 21 min  Charges:  $Gait Training: 8-22 mins                     Lyanne Co, PT  Acute Rehab Services  Pager [phone] Office [phone]    Lawana Chambers Eilene Voigt 12/14/2020, 1:31 PM

## 2020-12-14 NOTE — Anesthesia Preprocedure Evaluation (Signed)
Anesthesia Evaluation  Patient identified by MRN, date of birth, ID band Patient awake    Reviewed: Allergy & Precautions, NPO status , Patient's Chart, lab work & pertinent test results, reviewed documented beta blocker date and time   History of Anesthesia Complications Negative for: history of anesthetic complications  Airway Mallampati: II  TM Distance: >3 FB Neck ROM: Full    Dental  (+) Edentulous Upper, Poor Dentition, Missing, Dental Advisory Given   Pulmonary Current Smoker and Patient abstained from smoking.,  12/14/2020 SARS coronavirus NEG   breath sounds clear to auscultation       Cardiovascular hypertension, Pt. on medications and Pt. on home beta blockers (-) angina+ Peripheral Vascular Disease (ascending aortic aneurysm 4 cm)  DVT: asc aortic aneurysm.   Rhythm:Regular Rate:Normal  12/11/2020 ECHO: EF 60-65%, mod LVH, grade 1 DD, no significant valvular abnormalities   Neuro/Psych Seizures -,  Dementia CVA, No Residual Symptoms    GI/Hepatic negative GI ROS, Neg liver ROS,   Endo/Other  diabetes (diet controlled, glu 85)  Renal/GU negative Renal ROS     Musculoskeletal   Abdominal   Peds  Hematology negative hematology ROS (+)   Anesthesia Other Findings   Reproductive/Obstetrics                          Anesthesia Physical Anesthesia Plan  ASA: III  Anesthesia Plan: General   Post-op Pain Management:    Induction: Intravenous  PONV Risk Score and Plan: 1 and Ondansetron and Dexamethasone  Airway Management Planned: Oral ETT  Additional Equipment: Arterial line  Intra-op Plan:   Post-operative Plan: Extubation in OR  Informed Consent: I have reviewed the patients History and Physical, chart, labs and discussed the procedure including the risks, benefits and alternatives for the proposed anesthesia with the patient or authorized representative who has indicated  his/her understanding and acceptance.     Consent reviewed with POA and Dental advisory given  Plan Discussed with: CRNA and Surgeon  Anesthesia Plan Comments: (Discussed with daughter by telephone)      Anesthesia Quick Evaluation

## 2020-12-14 NOTE — Progress Notes (Addendum)
PROGRESS NOTE    Philip Robinson  JYN:[id number]  DOB: 08/27/1951  DOA: 12/10/2020 PCP: Hermelinda Delleninges, Marie Moore, NP Outpatient Specialists:   Hospital course:  70 year old with DM2, HTN, AAA, seizure disorder, vascular dementia was admitted 12/10/2020 with LLE weakness and altered mental status.  Work-up revealed small acute infarct of the watershed area of the right cerebral hemisphere.  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  Patient was seen by vascular surgery who are planning a TCAR procedure on Wednesday   Subjective:  Patient feels he is doing okay.  He has no acute complaints.  When I ask him if he is confused he denies it.  When I did tell him he has had a stroke he nods and says he understands.   Objective: Vitals:   12/13/20 2315 12/14/20 0310 12/14/20 0825 12/14/20 1129  BP: (!) 150/111 (!) 169/105 (!) 159/106 (!) 147/108  Pulse: 69 63 (!) 56 80  Resp: 18 18 18 18   Temp: 98 F (36.7 C) 98.6 F (37 C) 98.1 F (36.7 C) 98.5 F (36.9 C)  TempSrc: Oral Oral Oral Oral  SpO2: 100% 100% 100% 100%  Weight:      Height:        Intake/Output Summary (Last 24 hours) at 12/14/2020 1417 Last data filed at 12/13/2020 1700 Gross per 24 hour  Intake 240 ml  Output -  Net 240 ml   Filed Weights   12/10/20 1830  Weight: 94 kg     Exam:  General: Reasonably well-appearing gentleman sitting up in bed in no acute distress.  He is able to pick up his drink and swallow from a straw without difficulty when I asked him to do so. Eyes: sclera anicteric, conjuctiva mild injection bilaterally CVS: S1-S2, regular  Respiratory:  decreased air entry bilaterally secondary to decreased inspiratory effort, rales at bases  GI: NABS, soft, NT  LE: No edema.  Neuro: A/O x 3, Moving all extremities equally with normal strength, CN 3-12 intact, grossly nonfocal.  Psych: patient is logical and coherent, judgement and insight appear normal, mood and affect appropriate to  situation.   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 Plan is for TCAR tomorrow per vascular surgery Continue aspirin, Plavix and atorvastatin per neurology  Seizure disorder Continue Dilantin 200 twice daily  HTN Given ICA stenosis, patient will need a reasonable blood pressure to get blood into his brain Per neurology, systolic blood sugar should be in the 130-160 range. Hydralazine written for SBP greater than 1 9 After TCAR, goal would be to normalize blood pressure  DM2 Under reasonable control on present regimen  Vascular dementia Continue Aricept  Tobacco use Nicotine patch  AAA Outpatient follow-up for AAA 4 cm   DVT prophylaxis: Lovenox Code Status: Full Family Communication: None Disposition Plan:   Patient is from: Home  Anticipated Discharge Location: SNF  Barriers to Discharge: For TCAR tomorrow  Is patient medically stable for Discharge: No   Consultants:  Neurology  Vascular surgery  Procedures:  None yet  Antimicrobials:  None   Data Reviewed:  Basic Metabolic Panel: Recent Labs  Lab 12/10/20 1937 12/13/20 0748 12/14/20 0812  NA 141 135 137  K 4.5 3.6 3.8  CL 103 101 102  CO2 26 22 25   GLUCOSE 106* 75 88  BUN 18 12 16   CREATININE 1.06 0.89 1.11  CALCIUM 9.2 8.9 8.9  MG  --  1.8  --    Liver Function Tests: Recent Labs  Lab 12/10/20 1937  AST 18  ALT 15  ALKPHOS 98  BILITOT 0.8  PROT 7.5  ALBUMIN 3.8   No results for input(s): LIPASE, AMYLASE in the last 168 hours. Recent Labs  Lab 12/10/20 1937  AMMONIA 30   CBC: Recent Labs  Lab 12/10/20 1937 12/13/20 0748 12/14/20 0812  WBC 4.5 3.3* 3.6*  NEUTROABS 2.6  --   --   HGB 14.9 15.0 14.4  HCT 45.6 43.8 42.3  MCV 101.8* 95.8 96.1  PLT 206 193 189   Cardiac Enzymes: No results for  input(s): CKTOTAL, CKMB, CKMBINDEX, TROPONINI in the last 168 hours. BNP (last 3 results) No results for input(s): PROBNP in the last 8760 hours. CBG: Recent Labs  Lab 12/12/20 0821 12/12/20 1221 12/12/20 1628 12/14/20 0823 12/14/20 1131  GLUCAP 75 106* 143* 97 194*    Recent Results (from the past 240 hour(s))  SARS CORONAVIRUS 2 (TAT 6-24 HRS) Nasopharyngeal Nasopharyngeal Swab     Status: None   Collection Time: 12/05/20 12:50 AM   Specimen: Nasopharyngeal Swab  Result Value Ref Range Status   SARS Coronavirus 2 NEGATIVE NEGATIVE Final    Comment: (NOTE) SARS-CoV-2 target nucleic acids are NOT DETECTED.  The SARS-CoV-2 RNA is generally detectable in upper and lower respiratory specimens during the acute phase of infection. Negative results do not preclude SARS-CoV-2 infection, do not rule out co-infections with other pathogens, and should not be used as the sole basis for treatment or other patient management decisions. Negative results must be combined with clinical observations, patient history, and epidemiological information. The expected result is Negative.  Fact Sheet for Patients: 02/02/21  Fact Sheet for Healthcare Providers: HairSlick.no  This test is not yet approved or cleared by the quierodirigir.com FDA and  has been authorized for detection and/or diagnosis of SARS-CoV-2 by FDA under an Emergency Use Authorization (EUA). This EUA will remain  in effect (meaning this test can be used) for the duration of the COVID-19 declaration under Se ction 564(b)(1) of the Act, 21 U.S.C. section 360bbb-3(b)(1), unless the authorization is terminated or revoked sooner.  Performed at [facility], [address], [place], [place]   Urine Culture     Status: Abnormal   Collection Time: 12/06/20 10:38 AM   Specimen: Urine, Clean Catch  Result Value Ref Range Status   Specimen Description  URINE, CLEAN CATCH  Final   Special Requests NONE  Final   Culture (A)  Final    <10,000 COLONIES/mL INSIGNIFICANT GROWTH Performed at [facility], [address], [place], [place]    Report Status 12/07/2020 FINAL  Final  Resp Panel by RT-PCR (Flu A&B, Covid) Nasopharyngeal Swab     Status: None   Collection Time: 12/11/20 12:04 AM   Specimen: Nasopharyngeal Swab; Nasopharyngeal(NP) swabs in vial transport medium  Result Value Ref Range Status   SARS Coronavirus 2 by RT PCR NEGATIVE NEGATIVE Final    Comment: (NOTE) SARS-CoV-2 target nucleic acids are NOT DETECTED.  The SARS-CoV-2 RNA is generally detectable in upper respiratory specimens during the acute phase of infection. The lowest concentration of SARS-CoV-2 viral copies this assay can detect is 138 copies/mL. A negative result does not preclude SARS-Cov-2 infection and should not be used as the sole basis for treatment  or other patient management decisions. A negative result may occur with  improper specimen collection/handling, submission of specimen other than nasopharyngeal swab, presence of viral mutation(s) within the areas targeted by this assay, and inadequate number of viral copies(<138 copies/mL). A negative result must be combined with clinical observations, patient history, and epidemiological information. The expected result is Negative.  Fact Sheet for Patients:  BloggerCourse.com  Fact Sheet for Healthcare Providers:  SeriousBroker.it  This test is no t yet approved or cleared by the [place] FDA and  has been authorized for detection and/or diagnosis of SARS-CoV-2 by FDA under an Emergency Use Authorization (EUA). This EUA will remain  in effect (meaning this test can be used) for the duration of the COVID-19 declaration under Section 564(b)(1) of the Act, 21 U.S.C.section 360bbb-3(b)(1), unless the authorization is terminated  or  revoked sooner.       Influenza A by PCR NEGATIVE NEGATIVE Final   Influenza B by PCR NEGATIVE NEGATIVE Final    Comment: (NOTE) The Xpert Xpress SARS-CoV-2/FLU/RSV plus assay is intended as an aid in the diagnosis of influenza from Nasopharyngeal swab specimens and should not be used as a sole basis for treatment. Nasal washings and aspirates are unacceptable for Xpert Xpress SARS-CoV-2/FLU/RSV testing.  Fact Sheet for Patients: BloggerCourse.com  Fact Sheet for Healthcare Providers: SeriousBroker.it  This test is not yet approved or cleared by the [place] FDA and has been authorized for detection and/or diagnosis of SARS-CoV-2 by FDA under an Emergency Use Authorization (EUA). This EUA will remain in effect (meaning this test can be used) for the duration of the COVID-19 declaration under Section 564(b)(1) of the Act, 21 U.S.C. section 360bbb-3(b)(1), unless the authorization is terminated or revoked.  Performed at [facility], [address], [place], [place] [address]   SARS Coronavirus 2 by RT PCR (hospital order, performed in [facility] hospital lab)     Status: None   Collection Time: 12/14/20  3:20 AM  Result Value Ref Range Status   SARS Coronavirus 2 NEGATIVE NEGATIVE Final    Comment: (NOTE) SARS-CoV-2 target nucleic acids are NOT DETECTED.  The SARS-CoV-2 RNA is generally detectable in upper and lower respiratory specimens during the acute phase of infection. The lowest concentration of SARS-CoV-2 viral copies this assay can detect is 250 copies / mL. A negative result does not preclude SARS-CoV-2 infection and should not be used as the sole basis for treatment or other patient management decisions.  A negative result may occur with improper specimen collection / handling, submission of specimen other than nasopharyngeal swab, presence of viral mutation(s) within the areas targeted by this assay,  and inadequate number of viral copies (<250 copies / mL). A negative result must be combined with clinical observations, patient history, and epidemiological information.  Fact Sheet for Patients:   BoilerBrush.com.cy  Fact Sheet for Healthcare Providers: https://pope.com/  This test is not yet approved or  cleared by the [place] FDA and has been authorized for detection and/or diagnosis of SARS-CoV-2 by FDA under an Emergency Use Authorization (EUA).  This EUA will remain in effect (meaning this test can be used) for the duration of the COVID-19 declaration under Section 564(b)(1) of the Act, 21 U.S.C. section 360bbb-3(b)(1), unless the authorization is terminated or revoked sooner.  Performed at [facility], [address], [place], [place] [address]       Studies: No results found.   Scheduled Meds: . aspirin  325 mg  Oral Daily  . atorvastatin  40 mg Oral QHS  . clopidogrel  75 mg Oral Daily  . donepezil  5 mg Oral Q lunch  . enoxaparin (LOVENOX) injection  40 mg Subcutaneous Q24H  . mirabegron ER  50 mg Oral Q lunch  . mirtazapine  7.5 mg Oral Q lunch  . nicotine  14 mg Transdermal Daily  . phenytoin  200 mg Oral BID  . polyethylene glycol  17 g Oral BID  . senna  1 tablet Oral Daily  . sertraline  100 mg Oral Q lunch  . tamsulosin  0.4 mg Oral Q lunch   Continuous Infusions:  Principal Problem:   Acute CVA (cerebrovascular accident) (HCC) Active Problems:   Seizure (HCC)   Essential hypertension   Ascending aortic aneurysm (HCC)   Vascular dementia (HCC)   Nicotine abuse   Diabetes mellitus without complication (HCC)   Overweight (BMI 25.0-29.9)     Srobona Orma Flaming, [facility]  If 7PM-7AM, please contact night-coverage [email or web address] Password [email or web address] 12/14/2020, 2:17 PM    LOS: 3 days

## 2020-12-14 NOTE — Progress Notes (Addendum)
  Progress Note    12/14/2020 8:01 AM * No surgery date entered *  Subjective:  No complaints   Vitals:   12/13/20 2315 12/14/20 0310  BP: (!) 150/111 (!) 169/105  Pulse: 69 63  Resp: 18 18  Temp: 98 F (36.7 C) 98.6 F (37 C)  SpO2: 100% 100%   Physical Exam: Cardiac: regular Lungs: non labored Extremities: moving all extremities equally Neurologic: alert and oriented. Tongue midline.Speech coherent. Sensation intact and equal bilaterally CBC    Component Value Date/Time   WBC 3.3 (L) 12/13/2020 0748   RBC 4.57 12/13/2020 0748   HGB 15.0 12/13/2020 0748   HCT 43.8 12/13/2020 0748   PLT 193 12/13/2020 0748   MCV 95.8 12/13/2020 0748   MCH 32.8 12/13/2020 0748   MCHC 34.2 12/13/2020 0748   RDW 11.5 12/13/2020 0748   LYMPHSABS 1.0 12/10/2020 1937   MONOABS 0.6 12/10/2020 1937   EOSABS 0.2 12/10/2020 1937   BASOSABS 0.0 12/10/2020 1937    BMET    Component Value Date/Time   NA 135 12/13/2020 0748   K 3.6 12/13/2020 0748   CL 101 12/13/2020 0748   CO2 22 12/13/2020 0748   GLUCOSE 75 12/13/2020 0748   BUN 12 12/13/2020 0748   CREATININE 0.89 12/13/2020 0748   CALCIUM 8.9 12/13/2020 0748   GFRNONAA >60 12/13/2020 0748   GFRAA >60 07/29/2020 1535    INR No results found for: INR   Intake/Output Summary (Last 24 hours) at 12/14/2020 0801 Last data filed at 12/13/2020 1700 Gross per 24 hour  Intake 880 ml  Output --  Net 880 ml     Assessment/Plan:  70 y.o. male is with symptomatic high grade right ICA stenosis   -pt neuro in tact this am.  Plan is for right TCAR tomorrow with Dr. Chestine Spore -dual antiplatelet therapy and statin - NPO, Morning labs and consent in chart  Dory Horn [facility] [phone] 12/14/2020 8:01 AM   I have seen and evaluated the patient. I agree with the PA note as documented above.  70 year old male admitted with right brain event and high-grade proximal right ICA stenosis.  He is high risk  given tandem intracranial stenosis.  Plan right TCAR tomorrow.  Continue aspirin Plavix statin.  Discussed with his daughter who wishes to proceed.  NPO after midnight.  Cephus Shelling, MD [facility] [place] Office: [phone]

## 2020-12-14 NOTE — Progress Notes (Signed)
Inpatient Rehab Admissions Coordinator Note:   Per therapy recommendations, pt was screened for CIR candidacy by [place] Kat[place], MS CCC-SLP. At this time, Pt. Appears to have functional decline and is a good candidate for CIR. Will request order for rehab consult per protocol.  Please contact me with questions.   Kayna Suppa, MS, CCC-SLP Rehab Admissions Coordinator  [phone] (celll) [phone] (office)  

## 2020-12-14 NOTE — Evaluation (Signed)
Speech Language Pathology Evaluation Patient Details Name: Philip Robinson MRN: [id number] DOB: 07/22/1951 Today's Date: 12/14/2020 Time: 3419-3790 SLP Time Calculation (min) (ACUTE ONLY): 25 min  Problem List:  Patient Active Problem List   Diagnosis Date Noted  . Overweight (BMI 25.0-29.9) 12/12/2020  . Acute CVA (cerebrovascular accident) (HCC) 12/11/2020  . Status epilepticus (HCC) 12/05/2020  . Pain due to onychomycosis of toenails of both feet 12/10/2019  . Diabetes mellitus without complication (HCC) 12/10/2019  . Aortic dissection (HCC) 03/07/2019  . Ascending aortic aneurysm (HCC) 03/07/2019  . 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 Vascular dementia (HCC) 03/07/2019  . Nicotine abuse 03/07/2019  . CVA (cerebral vascular accident) (HCC) 03/07/2019  . Adjustment disorder with mixed anxiety and depressed mood 05/27/2015  . Essential hypertension   . Hypokalemia   . Seizure (HCC) 05/25/2015  . Acute encephalopathy 05/25/2015  . Delirium    Past Medical History:  Past Medical History:  Diagnosis Date  . Dementia (HCC)   . Diabetes mellitus without complication (HCC)   . Hypertension   . Seizure disorder [facility])    Past Surgical History: History reviewed. No pertinent surgical history. HPI:  70yo male admitted 12/10/20 with confusion and inability to stand. PMH: HTN, DM2, seizure disorder, stroke/aneurysm, vascular dementia. MRI = small acute infarcts in deep watershed territory of right cerebral hemisphere, advanced chronic ischemic injury.   Assessment / Plan / Recommendation Clinical Impression  The Mini-Mental State Examination (MMSE) was administered. Pt scored 11/30 (n=25+/30), indicating significant cognitive impairment. Points were lost on orientation, attention/calculation, delayed recall, reading, writing, and design copying. Pt has a history of vascular dementia at baseline, however, no family was present to discuss level of impairment. Recommend speech therapy at  next level of care to maximize pt function and minimize caregiver burden.    SLP Assessment  SLP Recommendation/Assessment: All further Speech Language Pathology needs can be addressed in the next venue of care SLP Visit Diagnosis: Cognitive communication deficit (R41.841)    Follow Up Recommendations  24 hour supervision/assistance       SLP Evaluation Cognition  Overall Cognitive Status: History of cognitive impairments - at baseline Arousal/Alertness: Awake/alert Orientation Level: Oriented to person;Disoriented to place;Disoriented to time;Disoriented to situation Attention: Focused Focused Attention: Appears intact Memory: Impaired Memory Impairment: Storage deficit;Retrieval deficit;Decreased recall of new information;Decreased short term memory       Comprehension  Auditory Comprehension Overall Auditory Comprehension: Appears within functional limits for tasks assessed Reading Comprehension Reading Status: Impaired Sentence Level: Impaired Interfering Components: Working memory    Expression Expression Primary Mode of Expression: Verbal Verbal Expression Overall Verbal Expression: Appears within functional limits for tasks assessed Initiation: No impairment Written Expression Dominant Hand: Right   Oral / Motor  Oral Motor/Sensory Function Overall Oral Motor/Sensory Function: Within functional limits Motor Speech Overall Motor Speech: Appears within functional limits for tasks assessed Intelligibility: Intelligible   GO                   Areana Kosanke B. Murvin Natal, [facility], CCC-SLP Speech Language Pathologist Office: [phone] Pager: [phone]  Leigh [facility] 12/14/2020, 12:48 PM

## 2020-12-15 ENCOUNTER — Inpatient Hospital Stay (HOSPITAL_COMMUNITY): Payer: Medicare Other | Admitting: Anesthesiology

## 2020-12-15 ENCOUNTER — Inpatient Hospital Stay (HOSPITAL_COMMUNITY): Payer: Medicare Other

## 2020-12-15 ENCOUNTER — Encounter (HOSPITAL_COMMUNITY): Payer: Self-pay | Admitting: Internal Medicine

## 2020-12-15 ENCOUNTER — Encounter (HOSPITAL_COMMUNITY)
Admission: EM | Disposition: A | Discharge status: Skilled Nursing Facility | Payer: Self-pay | Source: Home / Self Care | Attending: Internal Medicine

## 2020-12-15 DIAGNOSIS — I6521 Occlusion and stenosis of right carotid artery: Secondary | ICD-10-CM | POA: Diagnosis not present

## 2020-12-15 HISTORY — PX: TRANSCAROTID ARTERY REVASCULARIZATIONÂ: SHX6778

## 2020-12-15 LAB — BASIC METABOLIC PANEL
Anion gap: 7 (ref 5–15)
BUN: 15 mg/dL (ref 8–23)
CO2: 25 mmol/L (ref 22–32)
Calcium: 8.6 mg/dL — ABNORMAL LOW (ref 8.9–10.3)
Chloride: 103 mmol/L (ref 98–111)
Creatinine, Ser: 1.03 mg/dL (ref 0.61–1.24)
GFR, Estimated: >60 mL/min (ref >60)
Glucose, Bld: 85 mg/dL (ref 70–99)
Potassium: 4 mmol/L (ref 3.5–5.1)
Sodium: 135 mmol/L (ref 135–145)

## 2020-12-15 LAB — GLUCOSE, CAPILLARY
Glucose-Capillary: 140 mg/dL — ABNORMAL HIGH (ref 70–99)
Glucose-Capillary: 136 mg/dL — ABNORMAL HIGH (ref 70–99)

## 2020-12-15 LAB — POCT ACTIVATED CLOTTING TIME: Activated Clotting Time: 345 seconds

## 2020-12-15 SURGERY — TRANSCAROTID ARTERY REVASCULARIZATION (TCAR)
Anesthesia: General | Laterality: Right

## 2020-12-15 MED ORDER — LIDOCAINE 2% (20 MG/ML) 5 ML SYRINGE
INTRAMUSCULAR | Status: DC | PRN
  Administered 2020-12-15: 20 mg via INTRAVENOUS

## 2020-12-15 MED ORDER — SODIUM CHLORIDE 0.9 % IV SOLN: 500.0000 mL | Freq: Once | INTRAVENOUS | Status: DC | PRN

## 2020-12-15 MED ORDER — ROCURONIUM BROMIDE 10 MG/ML (PF) SYRINGE
PREFILLED_SYRINGE | INTRAVENOUS | Status: DC | PRN
  Administered 2020-12-15: 25 mg via INTRAVENOUS
  Administered 2020-12-15: 70 mg via INTRAVENOUS
  Administered 2020-12-15: 30 mg via INTRAVENOUS

## 2020-12-15 MED ORDER — LACTATED RINGERS IV SOLN: INTRAVENOUS | Status: DC | PRN

## 2020-12-15 MED ORDER — MIDAZOLAM HCL 2 MG/2ML IJ SOLN
0.5000 mg | Freq: Once | INTRAMUSCULAR | Status: DC | PRN
Start: 2020-12-15 — End: 2020-12-15

## 2020-12-15 MED ORDER — DEXAMETHASONE SODIUM PHOSPHATE 10 MG/ML IJ SOLN
INTRAMUSCULAR | Status: DC | PRN
  Administered 2020-12-15: 8 mg via INTRAVENOUS

## 2020-12-15 MED ORDER — SODIUM CHLORIDE 0.9 % IV SOLN
INTRAVENOUS | Status: DC | PRN
  Administered 2020-12-15: 500 mL

## 2020-12-15 MED ORDER — CEFAZOLIN SODIUM-DEXTROSE 2-4 GM/100ML-% IV SOLN
2.0000 g | Freq: Three times a day (TID) | INTRAVENOUS | Status: AC
  Administered 2020-12-15 – 2020-12-16 (×2): 2 g via INTRAVENOUS
  Filled 2020-12-15 (×2): qty 100

## 2020-12-15 MED ORDER — MEPERIDINE HCL 25 MG/ML IJ SOLN
6.2500 mg | INTRAMUSCULAR | Status: DC | PRN
Start: 2020-12-15 — End: 2020-12-15

## 2020-12-15 MED ORDER — OXYCODONE HCL 5 MG/5ML PO SOLN: 5.0000 mg | Freq: Once | ORAL | Status: DC | PRN

## 2020-12-15 MED ORDER — LABETALOL HCL 5 MG/ML IV SOLN
INTRAVENOUS | Status: AC
  Filled 2020-12-15: qty 4

## 2020-12-15 MED ORDER — MAGNESIUM SULFATE 2 GM/50ML IV SOLN: 2.0000 g | Freq: Every day | INTRAVENOUS | Status: DC | PRN

## 2020-12-15 MED ORDER — LABETALOL HCL 5 MG/ML IV SOLN
10.0000 mg | INTRAVENOUS | Status: DC | PRN
  Administered 2020-12-15: 10 mg via INTRAVENOUS
  Filled 2020-12-15 (×2): qty 4

## 2020-12-15 MED ORDER — POTASSIUM CHLORIDE CRYS ER 20 MEQ PO TBCR: 20.0000 meq | EXTENDED_RELEASE_TABLET | Freq: Every day | ORAL | Status: DC | PRN

## 2020-12-15 MED ORDER — FENTANYL CITRATE (PF) 100 MCG/2ML IJ SOLN: 25.0000 ug | INTRAMUSCULAR | Status: DC | PRN

## 2020-12-15 MED ORDER — 0.9 % SODIUM CHLORIDE (POUR BTL) OPTIME
TOPICAL | Status: DC | PRN
  Administered 2020-12-15: 1000 mL

## 2020-12-15 MED ORDER — SODIUM CHLORIDE 0.9 % IV SOLN
INTRAVENOUS | Status: AC
  Filled 2020-12-15: qty 1.2

## 2020-12-15 MED ORDER — CLEVIDIPINE BUTYRATE 0.5 MG/ML IV EMUL
1.0000 mg/h | INTRAVENOUS | Status: AC
  Administered 2020-12-15: 2 mg/h via INTRAVENOUS
  Filled 2020-12-15: qty 50

## 2020-12-15 MED ORDER — SUGAMMADEX SODIUM 200 MG/2ML IV SOLN
INTRAVENOUS | Status: DC | PRN
  Administered 2020-12-15: 180 mg via INTRAVENOUS

## 2020-12-15 MED ORDER — PHENYLEPHRINE HCL-NACL 10-0.9 MG/250ML-% IV SOLN
INTRAVENOUS | Status: DC | PRN
  Administered 2020-12-15: 25 ug/min via INTRAVENOUS

## 2020-12-15 MED ORDER — ALBUMIN HUMAN 5 % IV SOLN: INTRAVENOUS | Status: DC | PRN

## 2020-12-15 MED ORDER — LIDOCAINE HCL (PF) 1 % IJ SOLN
INTRAMUSCULAR | Status: AC
  Filled 2020-12-15: qty 30

## 2020-12-15 MED ORDER — PROMETHAZINE HCL 25 MG/ML IJ SOLN: 6.2500 mg | INTRAMUSCULAR | Status: DC | PRN

## 2020-12-15 MED ORDER — HEPARIN SODIUM (PORCINE) 1000 UNIT/ML IJ SOLN
INTRAMUSCULAR | Status: DC | PRN
  Administered 2020-12-15: 10000 [IU] via INTRAVENOUS

## 2020-12-15 MED ORDER — PROTAMINE SULFATE 10 MG/ML IV SOLN
INTRAVENOUS | Status: DC | PRN
Start: 2020-12-15 — End: 2020-12-15
  Administered 2020-12-15: 15 mg via INTRAVENOUS
  Administered 2020-12-15: 25 mg via INTRAVENOUS
  Administered 2020-12-15: 10 mg via INTRAVENOUS

## 2020-12-15 MED ORDER — METOPROLOL TARTRATE 5 MG/5ML IV SOLN: 2.0000 mg | INTRAVENOUS | Status: DC | PRN

## 2020-12-15 MED ORDER — FENTANYL CITRATE (PF) 250 MCG/5ML IJ SOLN
INTRAMUSCULAR | Status: DC | PRN
  Administered 2020-12-15: 50 ug via INTRAVENOUS
  Administered 2020-12-15: 75 ug via INTRAVENOUS
  Administered 2020-12-15: 25 ug via INTRAVENOUS

## 2020-12-15 MED ORDER — IODIXANOL 320 MG/ML IV SOLN
INTRAVENOUS | Status: DC | PRN
  Administered 2020-12-15: 33 mL

## 2020-12-15 MED ORDER — CEFAZOLIN SODIUM-DEXTROSE 2-3 GM-%(50ML) IV SOLR
INTRAVENOUS | Status: DC | PRN
  Administered 2020-12-15: 2 g via INTRAVENOUS

## 2020-12-15 MED ORDER — OXYCODONE HCL 5 MG PO TABS: 5.0000 mg | ORAL_TABLET | Freq: Once | ORAL | Status: DC | PRN

## 2020-12-15 MED ORDER — HEMOSTATIC AGENTS (NO CHARGE) OPTIME
TOPICAL | Status: DC | PRN
  Administered 2020-12-15 (×2): 1 via TOPICAL

## 2020-12-15 MED ORDER — PROPOFOL 10 MG/ML IV BOLUS
INTRAVENOUS | Status: DC | PRN
  Administered 2020-12-15: 40 mg via INTRAVENOUS
  Administered 2020-12-15: 80 mg via INTRAVENOUS
  Administered 2020-12-15: 50 mg via INTRAVENOUS
  Administered 2020-12-15: 120 mg via INTRAVENOUS
  Administered 2020-12-15 (×2): 50 mg via INTRAVENOUS

## 2020-12-15 MED ORDER — EPHEDRINE SULFATE 50 MG/ML IJ SOLN
INTRAMUSCULAR | Status: DC | PRN
  Administered 2020-12-15: 5 mg via INTRAVENOUS

## 2020-12-15 MED ORDER — ONDANSETRON HCL 4 MG/2ML IJ SOLN: 4.0000 mg | Freq: Four times a day (QID) | INTRAMUSCULAR | Status: DC | PRN

## 2020-12-15 SURGICAL SUPPLY — 59 items
BAG BANDED W/RUBBER/TAPE 36X54 (MISCELLANEOUS) ×2 IMPLANT
BALLN STERLING RX 6X30X80 (BALLOONS) ×2
BALLOON STERLING RX 6X30X80 (BALLOONS) ×1 IMPLANT
CANISTER SUCT 3000ML PPV (MISCELLANEOUS) ×2 IMPLANT
CATH BEACON 5 .035 40 KMP TP (CATHETERS) ×1 IMPLANT
CATH BEACON 5 .038 40 KMP TP (CATHETERS) ×1
CATH ROBINSON RED A/P 18FR (CATHETERS) IMPLANT
CLIP VESOCCLUDE MED 6/CT (CLIP) ×2 IMPLANT
CLIP VESOCCLUDE SM WIDE 6/CT (CLIP) ×2 IMPLANT
COVER DOME SNAP 22 D (MISCELLANEOUS) ×2 IMPLANT
COVER PROBE W GEL 5X96 (DRAPES) ×2 IMPLANT
COVER WAND RF STERILE (DRAPES) IMPLANT
DERMABOND ADVANCED (GAUZE/BANDAGES/DRESSINGS) ×2
DERMABOND ADVANCED .7 DNX12 (GAUZE/BANDAGES/DRESSINGS) ×2 IMPLANT
DEVICE INFLATION ENCORE 26 (MISCELLANEOUS) ×2 IMPLANT
DRAPE FEMORAL ANGIO 80X135IN (DRAPES) ×2 IMPLANT
ELECT REM PT RETURN 9FT ADLT (ELECTROSURGICAL) ×2
ELECTRODE REM PT RTRN 9FT ADLT (ELECTROSURGICAL) ×1 IMPLANT
GLOVE BIO SURGEON STRL SZ7.5 (GLOVE) ×4 IMPLANT
GLOVE BIO SURGEON STRL SZ8 (GLOVE) ×2 IMPLANT
GLOVE SS BIOGEL STRL SZ 6.5 (GLOVE) ×1 IMPLANT
GLOVE SUPERSENSE BIOGEL SZ 6.5 (GLOVE) ×1
GOWN STRL REUS W/ TWL LRG LVL3 (GOWN DISPOSABLE) ×4 IMPLANT
GOWN STRL REUS W/ TWL XL LVL3 (GOWN DISPOSABLE) ×1 IMPLANT
GOWN STRL REUS W/TWL LRG LVL3 (GOWN DISPOSABLE) ×4
GOWN STRL REUS W/TWL XL LVL3 (GOWN DISPOSABLE) ×1
HEMOSTAT SNOW SURGICEL 2X4 (HEMOSTASIS) ×4 IMPLANT
INTRODUCER KIT GALT 7CM (INTRODUCER) ×1
KIT BASIN OR (CUSTOM PROCEDURE TRAY) ×2 IMPLANT
KIT ENCORE 26 ADVANTAGE (KITS) ×2 IMPLANT
KIT INTRODUCER GALT 7 (INTRODUCER) ×1 IMPLANT
KIT TURNOVER KIT B (KITS) ×2 IMPLANT
NEEDLE HYPO 25GX1X1/2 BEV (NEEDLE) IMPLANT
PACK CAROTID (CUSTOM PROCEDURE TRAY) ×2 IMPLANT
POSITIONER HEAD DONUT 9IN (MISCELLANEOUS) ×2 IMPLANT
PROTECTION STATION PRESSURIZED (MISCELLANEOUS)
SET MICROPUNCTURE 5F STIFF (MISCELLANEOUS) ×4 IMPLANT
SHEATH PINNACLE 5F 10CM (SHEATH) ×2 IMPLANT
SHEATH PINNACLE 5FR (SHEATH) IMPLANT
SHUNT CAROTID BYPASS 10 (VASCULAR PRODUCTS) IMPLANT
SHUNT CAROTID BYPASS 12FRX15.5 (VASCULAR PRODUCTS) IMPLANT
STATION PROTECTION PRESSURIZED (MISCELLANEOUS) IMPLANT
STENT TRANSCAROTID SYS 10X40 (Permanent Stent) ×2 IMPLANT
SUT MNCRL AB 4-0 PS2 18 (SUTURE) ×2 IMPLANT
SUT PROLENE 5 0 C 1 24 (SUTURE) ×2 IMPLANT
SUT PROLENE 6 0 BV (SUTURE) ×2 IMPLANT
SUT PROLENE 7 0 BV 1 (SUTURE) IMPLANT
SUT SILK 2 0 PERMA HAND 18 BK (SUTURE) IMPLANT
SUT SILK 2 0 SH CR/8 (SUTURE) ×2 IMPLANT
SUT SILK 3 0 (SUTURE)
SUT SILK 3-0 18XBRD TIE 12 (SUTURE) IMPLANT
SUT VIC AB 3-0 SH 27 (SUTURE) ×2
SUT VIC AB 3-0 SH 27X BRD (SUTURE) ×2 IMPLANT
SYR 10ML LL (SYRINGE) ×6 IMPLANT
SYR 20ML LL LF (SYRINGE) ×2 IMPLANT
SYR CONTROL 10ML LL (SYRINGE) IMPLANT
TOWEL GREEN STERILE (TOWEL DISPOSABLE) ×2 IMPLANT
WATER STERILE IRR 1000ML POUR (IV SOLUTION) ×2 IMPLANT
WIRE BENTSON .035X145CM (WIRE) ×2 IMPLANT

## 2020-12-15 NOTE — Progress Notes (Signed)
  Day of Surgery Note    Subjective:  Seen in PACU. Awake and alert. Denies pain  General appearance: Awake, alert in no apparent distress Neurologic: Alert and oriented x4, tongue midline, face symmetric, grip strength 5/5 bilaterally; 5/5 plantar/dosiflexion Cardiac: Heart rate and rhythm are regular Respirations: Nonlabored Incision: Well approximated without bleeding or hematoma.  Subcutaneous tissue soft to palpation Groin puncture site: Soft without hematoma or bleeding   Vitals:   12/15/20 1345 12/15/20 1355  BP:  (!) 144/99  Pulse: 70 79  Resp: 19 17  Temp:    SpO2: 100% 100%      Assessment/Plan:  This is a 70 y.o. male who is s/p right TCAR. Hemodynamically stable. Neuro intact.  To 4E  Wendi Maya, PA-C 12/15/2020 2:35 PM [phone]

## 2020-12-15 NOTE — Progress Notes (Signed)
[facility] [place]  Subjective  - baseline confusion.   Objective (!) 168/92 66 97.9 F (36.6 C) (Oral) 17 100%  Intake/Output Summary (Last 24 hours) at 12/15/2020 0823 Last data filed at 12/15/2020 0600 Gross per 24 hour  Intake --  Output 795 ml  Net -795 ml    Neuro intact, no appreciable deficits  Laboratory Lab Results: Recent Labs    12/13/20 0748 12/14/20 0812  WBC 3.3* 3.6*  HGB 15.0 14.4  HCT 43.8 42.3  PLT 193 189   BMET Recent Labs    12/14/20 0812 12/15/20 0324  NA 137 135  K 3.8 4.0  CL 102 103  CO2 25 25  GLUCOSE 88 85  BUN 16 15  CREATININE 1.11 1.03  CALCIUM 8.9 8.6*    COAG No results found for: INR, PROTIME No results found for: PTT  Assessment/Planning:  Plan right TCAR after risks and benefits discussed with daughter.  Philip Robinson 12/15/2020 8:23 AM --

## 2020-12-15 NOTE — Anesthesia Procedure Notes (Signed)
Arterial Line Insertion Start/End1/11/2021 7:45 AM, 12/15/2020 7:50 AM Performed by: Rosalio Macadamia, CRNA, CRNA  Patient location: Pre-op. Preanesthetic checklist: patient identified, IV checked, site marked, risks and benefits discussed, surgical consent, monitors and equipment checked, pre-op evaluation, timeout performed and anesthesia consent Lidocaine 1% used for infiltration Left, radial was placed Catheter size: 20 G Hand hygiene performed  and maximum sterile barriers used   Attempts: 1 Procedure performed without using ultrasound guided technique. Following insertion, dressing applied. Post procedure assessment: normal  Patient tolerated the procedure well with no immediate complications.

## 2020-12-15 NOTE — Transfer of Care (Signed)
Immediate Anesthesia Transfer of Care Note  Patient: Philip Robinson  Procedure(s) Performed: TRANSCAROTID ARTERY REVASCULARIZATION RIGHT (Right )  Patient Location: PACU  Anesthesia Type:General  Level of Consciousness: drowsy, patient cooperative and responds to stimulation  Airway & Oxygen Therapy: Patient Spontanous Breathing and Patient connected to face mask oxygen, OPA  Post-op Assessment: Report given to RN and Post -op Vital signs reviewed and stable  Post vital signs: Reviewed and stable. Dr Chestine Spore at [facility] to eval. Pt observed moving RUE/RLE  Last Vitals:  Vitals Value Taken Time  BP 124/68 12/15/20 1110  Temp    Pulse 109 12/15/20 1120  Resp 26 12/15/20 1120  SpO2 100 % 12/15/20 1120  Vitals shown include unvalidated device data.  Last Pain:  Vitals:   12/15/20 0420  TempSrc: Oral  PainSc:          Complications: No complications documented.

## 2020-12-15 NOTE — Op Note (Addendum)
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  Ultrasound-guided access of left common femoral vein 2.  Left iliac  venogram 3.  Transcatheter placement of right carotid stent including angioplasty with reverse flow embolic neuroprotection (right TCAR, transcarotid stent)  Surgeon: Dr. Marty Heck, MD  Assistant: Risa Grill, PA  Indications: Patient is a 70 year old male with advanced dementia who was seen over the weekend with right brain stroke in the setting of high-grade right ICA proximal stenosis and a second tandem intracranial stenosis.  He presents today for planned right TCAR after risk benefits discussed.  Findings: Ultrasound-guided access of the left common femoral vein and had difficulty advancing the wire so a left iliac venogram was obtained that showed no stenosis and just tortuosity.  The left femoral sheath was placed and cut down on the right common carotid just above the clavicle and ultimately after pursestring the percutaneous sheath was placed.  Right carotid angiogram showed a greater than 80% ulcerated plaque in the proximal right ICA.  The lesion was predilated with a 6 mm x 30 mm angioplasty balloon after it was crossed and then primarily stented with a 10 mm x 40 mm Enroute stent.  No evidence of residual stenosis or dissection with widely patent stent at completion.  An assistant was needed for exposure and to expedite the case.  Anesthesia: General  Details: Patient was taken to the operating room after informed consent was obtained.  Placed on the operative table in supine position and general endotracheal anesthesia was induced.  I did use ultrasound to mark the right carotid artery just above the clavicle.  The right neck and both groins were prepped draped in usual sterile fashion.  Timeout was performed.  Initially  evaluated the left common femoral vein with ultrasound it was patent and image was saved.  This was accessed under ultrasound guidance with microwire, placed a micro sheath and then tried to advance a Bentson wire and met resistance.  Over the Bentson wire then placed a short 5 French sheath and left iliac venogram was obtained that showed no stenosis but just a tortuous vein segment that I was finally able to cross and get the venous sheath in place. Turned attention to the common carotid artery where a transverse incision was made above the right clavicle.  Dissected through the platysma and open the sternocleidomastoid between the 2 heads of the muscle.  Dissected down identified the internal jugular vein was mobilized laterally as well as the vagus nerve that was preserved and got a vessel loop and umbilical tape around the proximal common carotid artery.  I then placed a U stitch in the artery on the anterior wall with a 5-0 Prolene.  Patient was given 100 units/kg heparin ACT was checked to be greater than 250.  I then accessed the artery with a micro access needle in the pursestring on the anterior wall with a microwire and then a micro sheath.  A carotid angiogram was obtained showed the carotid bifurcation and the high-grade proximal right ICA stenosis.  We then advanced a J-wire below the carotid bifurcation and placed our larger sheath this was secured with multiple 3-0 silk sutures.  The flow reversal filter was then connected between the carotid sheath and the venous sheath.  We had good passive flow.  Once we had confirmed her ACT we then proceeded by clamping the common carotid  artery for active flow reversal with vessel loop.  I used a short KMP catheter with a 018 wire ultimately was able to cannulate the ICA and crossed the proximal stenosis into the distal vessel where it was healthy.  The lesion was then predilated with a 6 mm x 30 mm angioplasty balloon.  Patient did become profoundly  bradycardic during this period we then exchanged for a 10 mm x 40 mm Enroute stent that was deployed across the lesion in the internal carotid artery and into the distal common carotid artery.  There was no residual stenosis in the stent and this did not require postangioplasty.  There was excellent filling of the vessel distally.  We allowed active flow reversal for 2 additional minutes at completion to ensure adequate washout.  That point time the filter system was disconnected.  Ultimately the sheath in the common carotid artery was removed while holding digital pressure and our U stitch was tied down with good hemostasis.  We had excellent Doppler flow in the common carotid artery.  We then gave 50 mg of protamine for reversal.  The femoral sheath was removed and manual pressure was held.  The neck was then irrigated out with fluid until all the effluent was clear.  Surgicel snow was used hemostasis.  I ran the platysma closed with 3-0 Vicryl and skin closed 4-0 Monocryl Dermabond was applied.  Patient was awakened from anesthesia and taken to the PACU for prolonged recovery given he was very slow to follow commands.  In PACU was moving all extremities with no new deficits.  Complication: None  Condition: Stable  Marty Heck, MD [facility] Office: [place]

## 2020-12-15 NOTE — Progress Notes (Addendum)
Patient arrived from PACU to 4e02, patient with left groin incision level 0 and rt chest/neck area skin glue approximated. Vital signs obtained patient placed on monitor.  and CHG bath completed. Patient is pleasantly confused this is consistent with report from PACU RN. Skin otherwise intact. Will monitor patient. Rolene Andrades, Randall An rN

## 2020-12-15 NOTE — Anesthesia Procedure Notes (Signed)
Procedure Name: Intubation Date/Time: 12/15/2020 8:44 AM Performed by: Verdie Drown, CRNA Pre-anesthesia Checklist: Patient identified, Emergency Drugs available, Suction available and Patient being monitored Patient Re-evaluated:Patient Re-evaluated prior to induction Oxygen Delivery Method: Circle System Utilized Preoxygenation: Pre-oxygenation with 100% oxygen Induction Type: IV induction Ventilation: Mask ventilation without difficulty, Oral airway inserted - appropriate to patient size and Two handed mask ventilation required Laryngoscope Size: Mac and 4 Grade View: Grade I Tube type: Oral Tube size: 7.5 mm Number of attempts: 1 Airway Equipment and Method: Stylet and Oral airway Placement Confirmation: ETT inserted through vocal cords under direct vision,  positive ETCO2 and breath sounds checked- equal and bilateral Secured at: 25 cm Tube secured with: Tape Dental Injury: Teeth and Oropharynx as per pre-operative assessment

## 2020-12-15 NOTE — Anesthesia Postprocedure Evaluation (Signed)
Anesthesia Post Note  Patient: Usiel Astarita  Procedure(s) Performed: TRANSCAROTID ARTERY REVASCULARIZATION RIGHT (Right )     Patient location during evaluation: PACU Anesthesia Type: General Level of consciousness: awake and alert and patient cooperative Pain management: pain level controlled Vital Signs Assessment: post-procedure vital signs reviewed and stable Respiratory status: spontaneous breathing, nonlabored ventilation, respiratory function stable and patient connected to nasal cannula oxygen Cardiovascular status: blood pressure returned to baseline and stable Postop Assessment: no apparent nausea or vomiting and patient able to bend at knees Anesthetic complications: no   No complications documented.  Last Vitals:  Vitals:   12/15/20 1345 12/15/20 1355  BP:  (!) 144/99  Pulse: 70 79  Resp: 19 17  Temp:    SpO2: 100% 100%    Last Pain:  Vitals:   12/15/20 1340  TempSrc:   PainSc: Asleep                 JACKSON,E. CARSWELL

## 2020-12-15 NOTE — Progress Notes (Signed)
Patient transported to OR.  Report given.

## 2020-12-16 ENCOUNTER — Encounter (HOSPITAL_COMMUNITY): Payer: Self-pay | Admitting: Vascular Surgery

## 2020-12-16 DIAGNOSIS — I712 Thoracic aortic aneurysm, without rupture: Secondary | ICD-10-CM | POA: Diagnosis not present

## 2020-12-16 DIAGNOSIS — I10 Essential (primary) hypertension: Secondary | ICD-10-CM | POA: Diagnosis not present

## 2020-12-16 DIAGNOSIS — Z720 Tobacco use: Secondary | ICD-10-CM | POA: Diagnosis not present

## 2020-12-16 DIAGNOSIS — E663 Overweight: Secondary | ICD-10-CM | POA: Diagnosis not present

## 2020-12-16 LAB — BASIC METABOLIC PANEL
Anion gap: 10 (ref 5–15)
BUN: 12 mg/dL (ref 8–23)
CO2: 26 mmol/L (ref 22–32)
Calcium: 8.7 mg/dL — ABNORMAL LOW (ref 8.9–10.3)
Chloride: 102 mmol/L (ref 98–111)
Creatinine, Ser: 1 mg/dL (ref 0.61–1.24)
GFR, Estimated: >60 mL/min (ref >60)
Glucose, Bld: 108 mg/dL — ABNORMAL HIGH (ref 70–99)
Potassium: 4 mmol/L (ref 3.5–5.1)
Sodium: 138 mmol/L (ref 135–145)

## 2020-12-16 LAB — CBC
HCT: 41.9 % (ref 39.0–52.0)
Hemoglobin: 14.7 g/dL (ref 13.0–17.0)
MCH: 33.9 pg (ref 26.0–34.0)
MCHC: 35.1 g/dL (ref 30.0–36.0)
MCV: 96.5 fL (ref 80.0–100.0)
Platelets: 188 10*3/uL (ref 150–400)
RBC: 4.34 MIL/uL (ref 4.22–5.81)
RDW: 11.6 % (ref 11.5–15.5)
WBC: 5.1 10*3/uL (ref 4.0–10.5)
nRBC: 0 % (ref 0.0–0.2)

## 2020-12-16 NOTE — Progress Notes (Signed)
Physical Therapy Treatment Patient Details Name: Philip Robinson MRN: [id number] DOB: 1951-09-25 Today's Date: 12/16/2020    History of Present Illness Pt is a 70 y.o. male with a medical hx significant for seizures, HTN, dementia, stroke anuerysm, and DM2 who presents with generalized weakness especially on the L side, increased confusion, and difficulty standing since d/c 12/06/20 from recent hospitalization 2/2 seizures. MRI revealed small acute infarcts of the watershed area of the R cerebral hemisphere. CTA showed R ICA stenosis. s/p 12/15/20 s/p right TCAR    PT Comments    Pt sitting up in chair on entrance, eager to walk with therapy. Pt limited by decreased command follow, difficulty sequencing, slow processing and decreased coordination. Pt requires modAx2 for power up to RW and modAx2, for close chair follow for 2 bouts of 8 feet ambulation. Pt is making progress towards his goals, however needs SNF level rehab to regain his PLOF. PT will continue to follow acutely.   Follow Up Recommendations  SNF     Equipment Recommendations  3in1 (PT);Rolling walker with 5" wheels       Precautions / Restrictions Precautions Precautions: Fall Precaution Comments: seizures; dementia Restrictions Weight Bearing Restrictions: No    Mobility  Bed Mobility               General bed mobility comments: OOB in recliner  Transfers Overall transfer level: Needs assistance Equipment used: Rolling walker (2 wheeled) Transfers: Sit to/from [employer] Transfers Sit to Stand: Mod assist;+2 physical assistance         General transfer comment: mod A x2 for sit<>stand from recliner x2 requires maximal vc for hand placement on recliner arm rests and for looking up and pushing forward with his chest  Ambulation/Gait Ambulation/Gait assistance: Mod assist Gait Distance (Feet): 8 Feet (x2) Assistive device: Rolling walker (2 wheeled) Gait Pattern/deviations:  Step-to pattern;Step-through pattern;Narrow base of support;Leaning posteriorly Gait velocity: reduced Gait velocity interpretation: <1.31 ft/sec, indicative of household ambulator General Gait Details: pt with some posterior lean, maximal vc for sequencing, and increased BoS      Modified Rankin (Stroke Patients Only) Modified Rankin (Stroke Patients Only) Pre-Morbid Rankin Score: Moderate disability Modified Rankin: Moderately severe disability     Balance Overall balance assessment: Needs assistance Sitting-balance support: Single extremity supported;Feet supported Sitting balance-Leahy Scale: Fair Sitting balance - Comments: pt able to sit without support of hands Postural control: Posterior lean;Left lateral lean Standing balance support: Bilateral upper extremity supported;During functional activity Standing balance-Leahy Scale: Poor Standing balance comment: worked on weight shift forward                            Cognition Arousal/Alertness: Awake/alert Behavior During Therapy: WFL for tasks assessed/performed Overall Cognitive Status: History of cognitive impairments - at baseline Area of Impairment: Orientation;Attention;Memory;Following commands;Safety/judgement;Awareness;Problem solving                 Orientation Level: Disoriented to;Place;Time;Situation Current Attention Level: Selective Memory: Decreased short-term memory;Decreased recall of precautions Following Commands: Follows one step commands with increased time;Follows multi-step commands inconsistently Safety/Judgement: Decreased awareness of safety;Decreased awareness of deficits Awareness: Intellectual Problem Solving: Slow processing;Decreased initiation;Difficulty sequencing;Requires verbal cues;Requires tactile cues General Comments: continues to be pleasantly confused, 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      Pertinent Vitals/Pain Pain  Assessment: No/denies pain Pain Intervention(s): Monitored during session     PT Goals (current goals can now be found in the care plan section) Acute Rehab PT Goals Patient Stated Goal: go home PT Goal Formulation: With patient/family Time For Goal Achievement: 12/26/20 Potential to Achieve Goals: Good Progress towards PT goals: Progressing toward goals    Frequency    Min 2X/week      PT Plan Discharge plan needs to be updated       AM-PAC PT "6 Clicks" Mobility   Outcome Measure  Help needed turning from your back to your side while in a flat bed without using bedrails?: A Little Help needed moving from lying on your back to sitting on the side of a flat bed without using bedrails?: A Little Help needed moving to and from a bed to a chair (including a wheelchair)?: A Lot Help needed standing up from a chair using your arms (e.g., wheelchair or bedside chair)?: A Lot Help needed to walk in hospital room?: A Lot Help needed climbing 3-5 steps with a railing? : Total 6 Click Score: 13    End of Session Equipment Utilized During Treatment: Gait belt Activity Tolerance: Patient tolerated treatment well Patient left: with call bell/phone within reach;with chair alarm set;in chair Nurse Communication: Mobility status PT Visit Diagnosis: Unsteadiness on feet (R26.81);Other abnormalities of gait and mobility (R26.89);Muscle weakness (generalized) (M62.81);Difficulty in walking, not elsewhere classified (R26.2);Ataxic gait (R26.0)     Time: 1610-9604 PT Time Calculation (min) (ACUTE ONLY): 25 min  Charges:  $Gait Training: 8-22 mins $Therapeutic Activity: 8-22 mins                     Axel Meas B. Beverely Risen PT, DPT Acute Rehabilitation Services Pager [phone] Office [phone]    Elon Alas Fleet 12/16/2020, 3:41 PM

## 2020-12-16 NOTE — Progress Notes (Addendum)
PROGRESS NOTE    Philip Robinson   SNK:[id number]  DOB: 05-Nov-1951  DOA: 12/10/2020 PCP: Hermelinda Dellen, NP   Brief Narrative:  Philip Robinson is a 70 year old with DM2, HTN, AAA, seizure disorder, vascular dementia was admitted 12/10/2020 with LLE weakness and altered mental status.  Work-up revealed small acute infarct of the watershed area of the right cerebral hemisphere.  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     Subjective: Has some pain with swallowing today. Overall pain is not severe.   Assessment & Plan:   Principal Problem:   Acute CVA (cerebrovascular accident)- right cerebral infarct - cont ASA and Plavix per Neuro recommendations - LDL 73- cont statin - A1c 5.3 - ECHO w/o thrombus, normal EF grade 1 d CHF -   right ICA stenosis- he is s/p right ICA stenting via TCAR on 1/12- f/u planned for 4 wks from now- he appears stable post op - PT/OT recommending CIR vs 24 hr supervision - CIR has seen the patient and a coordinating a time for his admission Addendum: daughter would like patient to go to SNF as daughter is not able to care for him after he returns home  Active Problems:   Seizure disorder - cont Dilantin- level was 18 on 1/8 - follow levels intermittently -  last seizure was on 1/3 and dilantin dose was modified - due to acute CVA, he is now a higher risk for seizures    Essential hypertension - holding Amlodipine, Labetalol, Hydralazine and Lisinopril in setting of acute CVA to allow permissive HTN    Ascending aortic aneurysm   - 4 cm per last imaging - needs close outpt follow up    Dementia  - on Aricept     Nicotine abuse - cont Nicoderm patch    Diabetes mellitus without complication - last A1c 5.3- not on medications for DM per his home med list- based on this A1c, he is not diabetic and not ever glucose intolerant     Overweight (BMI 25.0-29.9) - Body mass index is 28.11 kg/m.   Time spent in minutes: 25 DVT prophylaxis:  SCD's Start: 12/15/20 1605 enoxaparin (LOVENOX) injection 40 mg Start: 12/11/20 0830  Code Status: Full code Family Communication:  Disposition Plan:  Status is: Inpatient  Remains inpatient appropriate because:Awaiting CIR bed   Dispo: The patient is from: Home              Anticipated d/c is to: CIR              Anticipated d/c date is: > 3 days              Patient currently is medically stable to d/c.      Consultants:   Neurology  Vascular surgery Procedures:   TCAR Antimicrobials:  Anti-infectives (From admission, onward)   Start     Dose/Rate Route Frequency Ordered Stop   12/15/20 1700  ceFAZolin (ANCEF) IVPB 2g/100 mL premix        2 g 200 mL/hr over 30 Minutes Intravenous Every 8 hours 12/15/20 1604 12/16/20 0039       Objective: Vitals:   12/16/20 0200 12/16/20 0345 12/16/20 0805 12/16/20 1259  BP: (!) 156/92 (!) 173/85 129/85 (!) 142/104  Pulse:  74 63 86  Resp: 18 20 19 15   Temp:  97.6 F (36.4 C) 98.1 F (36.7 C) 98.3 F (36.8 C)  TempSrc:  Oral Oral Oral  SpO2: 100% 98% 98% 97%  Weight:      Height:        Intake/Output Summary (Last 24 hours) at 12/16/2020 1434 Last data filed at 12/16/2020 0300 Gross per 24 hour  Intake 490 ml  Output 600 ml  Net -110 ml   Filed Weights   12/10/20 1830  Weight: 94 kg    Examination: General exam: Appears comfortable  HEENT: PERRLA, oral mucosa moist, no sclera icterus or thrush Respiratory system: Clear to auscultation. Respiratory effort normal. Cardiovascular system: S1 & S2 heard, RRR.   Gastrointestinal system: Abdomen soft, non-tender, nondistended. Normal bowel sounds. Central nervous system: Alert and oriented. No focal neurological deficits. Extremities: No cyanosis, clubbing or edema Skin: No rashes or ulcers Psychiatry:  Mood & affect appropriate.     Data Reviewed: I have personally reviewed following labs and imaging studies  CBC: Recent Labs  Lab 12/10/20 1937  12/13/20 0748 12/14/20 0812 12/16/20 0102  WBC 4.5 3.3* 3.6* 5.1  NEUTROABS 2.6  --   --   --   HGB 14.9 15.0 14.4 14.7  HCT 45.6 43.8 42.3 41.9  MCV 101.8* 95.8 96.1 96.5  PLT 206 193 189 188   Basic Metabolic Panel: Recent Labs  Lab 12/10/20 1937 12/13/20 0748 12/14/20 0812 12/15/20 0324 12/16/20 0102  NA 141 135 137 135 138  K 4.5 3.6 3.8 4.0 4.0  CL 103 101 102 103 102  CO2 26 22 25 25 26   GLUCOSE 106* 75 88 85 108*  BUN 18 12 16 15 12   CREATININE 1.06 0.89 1.11 1.03 1.00  CALCIUM 9.2 8.9 8.9 8.6* 8.7*  MG  --  1.8  --   --   --    GFR: Estimated Creatinine Clearance: 80.7 mL/min (by C-G formula based on SCr of 1 mg/dL). Liver Function Tests: Recent Labs  Lab 12/10/20 1937  AST 18  ALT 15  ALKPHOS 98  BILITOT 0.8  PROT 7.5  ALBUMIN 3.8   No results for input(s): LIPASE, AMYLASE in the last 168 hours. Recent Labs  Lab 12/10/20 1937  AMMONIA 30   Coagulation Profile: No results for input(s): INR, PROTIME in the last 168 hours. Cardiac Enzymes: No results for input(s): CKTOTAL, CKMB, CKMBINDEX, TROPONINI in the last 168 hours. BNP (last 3 results) No results for input(s): PROBNP in the last 8760 hours. HbA1C: No results for input(s): HGBA1C in the last 72 hours. CBG: Recent Labs  Lab 12/14/20 0823 12/14/20 1131 12/14/20 1610 12/15/20 1117 12/15/20 1610  GLUCAP 97 194* 133* 140* 136*   Lipid Profile: No results for input(s): CHOL, HDL, LDLCALC, TRIG, CHOLHDL, LDLDIRECT in the last 72 hours. Thyroid Function Tests: No results for input(s): TSH, T4TOTAL, FREET4, T3FREE, THYROIDAB in the last 72 hours. Anemia Panel: No results for input(s): VITAMINB12, FOLATE, FERRITIN, TIBC, IRON, RETICCTPCT in the last 72 hours. Urine analysis:    Component Value Date/Time   COLORURINE YELLOW 12/10/2020 1843   APPEARANCEUR CLEAR 12/10/2020 1843   LABSPEC 1.027 12/10/2020 1843   PHURINE 5.0 12/10/2020 1843   GLUCOSEU NEGATIVE 12/10/2020 1843   HGBUR  NEGATIVE 12/10/2020 1843   BILIRUBINUR NEGATIVE 12/10/2020 1843   KETONESUR NEGATIVE 12/10/2020 1843   PROTEINUR NEGATIVE 12/10/2020 1843   NITRITE NEGATIVE 12/10/2020 1843   LEUKOCYTESUR NEGATIVE 12/10/2020 1843   Sepsis Labs: @LABRCNTIP (procalcitonin:4,lacticidven:4) ) Recent Results (from the past 240 hour(s))  Resp Panel by RT-PCR (Flu A&B, Covid) Nasopharyngeal Swab     Status: None   Collection Time: 12/11/20 12:04 AM   Specimen:  Nasopharyngeal Swab; Nasopharyngeal(NP) swabs in vial transport medium  Result Value Ref Range Status   SARS Coronavirus 2 by RT PCR NEGATIVE NEGATIVE Final    Comment: (NOTE) SARS-CoV-2 target nucleic acids are NOT DETECTED.  The SARS-CoV-2 RNA is generally detectable in upper respiratory specimens during the acute phase of infection. The lowest concentration of SARS-CoV-2 viral copies this assay can detect is 138 copies/mL. A negative result does not preclude SARS-Cov-2 infection and should not be used as the sole basis for treatment or other patient management decisions. A negative result may occur with  improper specimen collection/handling, submission of specimen other than nasopharyngeal swab, presence of viral mutation(s) within the areas targeted by this assay, and inadequate number of viral copies(<138 copies/mL). A negative result must be combined with clinical observations, patient history, and epidemiological information. The expected result is Negative.  Fact Sheet for Patients:  BloggerCourse.com  Fact Sheet for Healthcare Providers:  SeriousBroker.it  This test is no t yet approved or cleared by the [place] FDA and  has been authorized for detection and/or diagnosis of SARS-CoV-2 by FDA under an Emergency Use Authorization (EUA). This EUA will remain  in effect (meaning this test can be used) for the duration of the COVID-19 declaration under Section 564(b)(1) of the Act,  21 U.S.C.section 360bbb-3(b)(1), unless the authorization is terminated  or revoked sooner.       Influenza A by PCR NEGATIVE NEGATIVE Final   Influenza B by PCR NEGATIVE NEGATIVE Final    Comment: (NOTE) The Xpert Xpress SARS-CoV-2/FLU/RSV plus assay is intended as an aid in the diagnosis of influenza from Nasopharyngeal swab specimens and should not be used as a sole basis for treatment. Nasal washings and aspirates are unacceptable for Xpert Xpress SARS-CoV-2/FLU/RSV testing.  Fact Sheet for Patients: BloggerCourse.com  Fact Sheet for Healthcare Providers: SeriousBroker.it  This test is not yet approved or cleared by the [place] FDA and has been authorized for detection and/or diagnosis of SARS-CoV-2 by FDA under an Emergency Use Authorization (EUA). This EUA will remain in effect (meaning this test can be used) for the duration of the COVID-19 declaration under Section 564(b)(1) of the Act, 21 U.S.C. section 360bbb-3(b)(1), unless the authorization is terminated or revoked.  Performed at [facility], [address], [place], [place] [address]   SARS Coronavirus 2 by RT PCR (hospital order, performed in [facility] hospital lab)     Status: None   Collection Time: 12/14/20  3:20 AM  Result Value Ref Range Status   SARS Coronavirus 2 NEGATIVE NEGATIVE Final    Comment: (NOTE) SARS-CoV-2 target nucleic acids are NOT DETECTED.  The SARS-CoV-2 RNA is generally detectable in upper and lower respiratory specimens during the acute phase of infection. The lowest concentration of SARS-CoV-2 viral copies this assay can detect is 250 copies / mL. A negative result does not preclude SARS-CoV-2 infection and should not be used as the sole basis for treatment or other patient management decisions.  A negative result may occur with improper specimen collection / handling, submission of specimen other than nasopharyngeal  swab, presence of viral mutation(s) within the areas targeted by this assay, and inadequate number of viral copies (<250 copies / mL). A negative result must be combined with clinical observations, patient history, and epidemiological information.  Fact Sheet for Patients:   BoilerBrush.com.cy  Fact Sheet for Healthcare Providers: https://pope.com/  This test is not yet approved or  cleared by the [place] FDA and has  been authorized for detection and/or diagnosis of SARS-CoV-2 by FDA under an Emergency Use Authorization (EUA).  This EUA will remain in effect (meaning this test can be used) for the duration of the COVID-19 declaration under Section 564(b)(1) of the Act, 21 U.S.C. section 360bbb-3(b)(1), unless the authorization is terminated or revoked sooner.  Performed at [facility], [address], [place], [place] [address]          Radiology Studies: Structural Heart Procedure  Result Date: 12/15/2020 See surgical note for result.  HYBRID OR IMAGING ([facility] ONLY)  Result Date: 12/15/2020 There is no interpretation for this exam.  This order is for images obtained during a surgical procedure.  Please See "Surgeries" Tab for more information regarding the procedure.      Scheduled Meds: . aspirin  325 mg Oral Daily  . atorvastatin  40 mg Oral QHS  . clopidogrel  75 mg Oral Daily  . donepezil  5 mg Oral Q lunch  . enoxaparin (LOVENOX) injection  40 mg Subcutaneous Q24H  . mirabegron ER  50 mg Oral Q lunch  . mirtazapine  7.5 mg Oral Q lunch  . nicotine  14 mg Transdermal Daily  . phenytoin  200 mg Oral BID  . polyethylene glycol  17 g Oral BID  . senna  1 tablet Oral Daily  . sertraline  100 mg Oral Q lunch  . tamsulosin  0.4 mg Oral Q lunch   Continuous Infusions: . sodium chloride    . magnesium sulfate bolus IVPB       LOS: 5 days      Calvert Cantor, MD [facility] Pager: [email or web address] 12/16/2020, 2:34 PM

## 2020-12-16 NOTE — Progress Notes (Signed)
Pt pulled Left Hand IV out along with condom cath and tele leads x 2. Pt redirected and placed back on monitor and mittens placed. Will continue to monitor    Everlean Cherry, RN

## 2020-12-16 NOTE — Progress Notes (Addendum)
  Progress Note    12/16/2020 7:11 AM 1 Day Post-Op  Subjective:  Feels better; denies trouble swallowing  Afebrile HR 50's-60's  120's-170's systolic 98% RA  Gtts:  Clevidipine  Vitals:   12/16/20 0200 12/16/20 0345  BP: (!) 156/92 (!) 173/85  Pulse:  74  Resp: 18 20  Temp:  97.6 F (36.4 C)  SpO2: 100% 98%     Physical Exam: Neuro:  In tact; tongue is midline Lungs:  Non labored Incision:  Right neck incision clean and dry; left groin is soft  CBC    Component Value Date/Time   WBC 5.1 12/16/2020 0102   RBC 4.34 12/16/2020 0102   HGB 14.7 12/16/2020 0102   HCT 41.9 12/16/2020 0102   PLT 188 12/16/2020 0102   MCV 96.5 12/16/2020 0102   MCH 33.9 12/16/2020 0102   MCHC 35.1 12/16/2020 0102   RDW 11.6 12/16/2020 0102   LYMPHSABS 1.0 12/10/2020 1937   MONOABS 0.6 12/10/2020 1937   EOSABS 0.2 12/10/2020 1937   BASOSABS 0.0 12/10/2020 1937    BMET    Component Value Date/Time   NA 138 12/16/2020 0102   K 4.0 12/16/2020 0102   CL 102 12/16/2020 0102   CO2 26 12/16/2020 0102   GLUCOSE 108 (H) 12/16/2020 0102   BUN 12 12/16/2020 0102   CREATININE 1.00 12/16/2020 0102   CALCIUM 8.7 (L) 12/16/2020 0102   GFRNONAA >60 12/16/2020 0102   GFRAA >60 07/29/2020 1535     Intake/Output Summary (Last 24 hours) at 12/16/2020 0711 Last data filed at 12/16/2020 0300 Gross per 24 hour  Intake 2640 ml  Output 1200 ml  Net 1440 ml     Assessment/Plan:  This is a 70 y.o. male who is s/p right TCAR 1 Day Post-Op  -pt is doing well this am. -pt neuro exam is in tact-he denies any trouble swallowing -doing well from surgery standpoint.  Needs to mobilize and void.  -follow up with Dr. Chestine Spore in 4 weeks with duplex.  Our office will call to arrange appointment.  -he will need to discharge on asa/statin/plavix -pt does not have any prn pain medication ordered and unsure why these were discontinued and RN does not know.  After talking with Dr. Chestine Spore, pt was slow to wake  up in the OR yesterday.  Will defer to primary team to re-order if he needs them, but appears comfortable this morning.    Doreatha Massed, PA-C [facility] [phone]   I have seen and evaluated the patient. I agree with the PA note as documented above.  Postop day 1 status post right TCAR for high-grade right ICA stenosis with tandem intracranial stenosis.  Looks good this morning and appears neurologically intact and moving all extremities at his pre-op baseline.  Will need aspirin Plavix statin at discharge.  Can be discharged from my standpoint or transitioned to SNF/CIR pending therapy recommendations.  I will see him in 4 weeks with a carotid duplex.  Cephus Shelling, MD [facility] [place] Office: [phone]

## 2020-12-16 NOTE — Progress Notes (Signed)
Occupational Therapy Treatment Patient Details Name: Philip Robinson MRN: [id number] DOB: 05/25/1951 Today's Date: 12/16/2020    History of present illness Pt is a 70 y.o. male with a medical hx significant for seizures, HTN, dementia, stroke anuerysm, and DM2 who presents with generalized weakness especially on the L side, increased confusion, and difficulty standing since d/c 12/06/20 from recent hospitalization 2/2 seizures. MRI revealed small acute infarcts of the watershed area of the R cerebral hemisphere. CTA showed R ICA stenosis. s/p 12/15/20 s/p right TCAR   OT comments  Pt progressing towards acute OT goals. Pt completed bed mobility at min guard level. Worked on functional transfers. Pt able to come to standing a few times from EOB at mod A level. Difficulty initiating side/pivotal step to access recliner (simulating toilet transfer to [facility]). Pt needed a couple of brief seated rest breaks. Able to take a step towards the right but ultimately needed max A to complete rest of transfer into recliner as squat pivot. Pt stated he was happy to be working with therapy. D/c plan remains appropriate.    Follow Up Recommendations  CIR;Supervision/Assistance - 24 hour    Equipment Recommendations  Other (comment) (defer to next venue)    Recommendations for Other Services      Precautions / Restrictions Precautions Precautions: Fall Precaution Comments: seizures; dementia Restrictions Weight Bearing Restrictions: No       Mobility Bed Mobility Overal bed mobility: Needs Assistance Bed Mobility: Supine to Sit     Supine to sit: Min guard     General bed mobility comments: needed tactile cues to initiate movement but once going, was able to come to EOB without physical assist  Transfers Overall transfer level: Needs assistance Equipment used: Rolling walker (2 wheeled) Transfers: Sit to/from [employer] Transfers Sit to Stand: Mod assist Stand pivot  transfers: Max assist       General transfer comment: mod A 2/2 posterior lean to stand from EOB, better with EOB elevated a bit. Max difficulty initiating pivotal step towards right side utilizing rw. 2 brief sitting rest breaks incorporated. On last SPT trial able to take a step towards the right. Squat-pivot to fully transfer into recliner.    Balance Overall balance assessment: Needs assistance Sitting-balance support: Single extremity supported;Feet supported Sitting balance-Leahy Scale: Fair Sitting balance - Comments: pt able to sit without support of hands Postural control: Posterior lean;Left lateral lean Standing balance support: Bilateral upper extremity supported;During functional activity Standing balance-Leahy Scale: Poor Standing balance comment: worked on anterior wt shift in standing within RW                           ADL either performed or assessed with clinical judgement   ADL Overall ADL's : Needs assistance/impaired                         Toilet Transfer: Maximal assistance;Stand-pivot;Squat-pivot Statistician Details (indicate cue type and reason): Simulated with EOB to recliner going to pt's right side. Pt with max difficulty initiating (and sequencing?) pivotal step with rw. Able to advance BLE to about the halfway point of transfer. Pt began to iniate sitting down, therapist completed rest of transfer at max A kevek squat pivot.           General ADL Comments: Pt completed bed mobility, worked on functional transfers,     Interior and spatial designer  Praxis      Cognition Arousal/Alertness: Awake/alert Behavior During Therapy: Flat affect;WFL for tasks assessed/performed Overall Cognitive Status: History of cognitive impairments - at baseline Area of Impairment: Orientation;Attention;Memory;Following commands;Safety/judgement;Awareness;Problem solving                 Orientation Level: Disoriented  to;Place;Time;Situation Current Attention Level: Selective Memory: Decreased short-term memory;Decreased recall of precautions Following Commands: Follows one step commands with increased time;Follows multi-step commands inconsistently Safety/Judgement: Decreased awareness of safety;Decreased awareness of deficits Awareness: Intellectual Problem Solving: Slow processing;Decreased initiation;Difficulty sequencing;Requires verbal cues;Requires tactile cues General Comments: Initially pt stating that he's from home and lives only with his dog. Once sitting EOB pt stating that he is in the hospital and lives with his wife. Tangential responses. Pleasantly confused. Poor insight into deficts.        Exercises Exercises: General Lower Extremity General Exercises - Lower Extremity Ankle Circles/Pumps: AROM;Both;10 reps;Supine   Shoulder Instructions       General Comments      Pertinent Vitals/ Pain       Pain Assessment: No/denies pain Faces Pain Scale: No hurt  Home Living                                          Prior Functioning/Environment              Frequency  Min 2X/week        Progress Toward Goals  OT Goals(current goals can now be found in the care plan section)  Progress towards OT goals: Progressing toward goals  Acute Rehab OT Goals Patient Stated Goal: go home OT Goal Formulation: With patient Time For Goal Achievement: 12/27/20 Potential to Achieve Goals: Good ADL Goals Pt Will Perform Grooming: with min guard assist;standing Pt Will Perform Upper Body Dressing: with set-up;with supervision;sitting Pt Will Perform Lower Body Dressing: with min guard assist;sit to/from stand Pt Will Transfer to Toilet: with min guard assist;regular height toilet;ambulating Pt Will Perform Toileting - Clothing Manipulation and hygiene: with min guard assist;sitting/lateral leans;sit to/from stand Additional ADL Goal #1: Pt will follow two-step  commands with Min cues during ADLs  Plan Discharge plan remains appropriate    Co-evaluation                 AM-PAC OT "6 Clicks" Daily Activity     Outcome Measure   Help from another person eating meals?: None Help from another person taking care of personal grooming?: A Little Help from another person toileting, which includes using toliet, bedpan, or urinal?: A Lot Help from another person bathing (including washing, rinsing, drying)?: A Lot Help from another person to put on and taking off regular upper body clothing?: A Little Help from another person to put on and taking off regular lower body clothing?: A Lot 6 Click Score: 16    End of Session Equipment Utilized During Treatment: Rolling walker;Gait belt  OT Visit Diagnosis: Unsteadiness on feet (R26.81);Other abnormalities of gait and mobility (R26.89);Muscle weakness (generalized) (M62.81)   Activity Tolerance Patient tolerated treatment well   Patient Left in chair;with call bell/phone within reach;with chair alarm set   Nurse Communication          Time: 0350-0938 OT Time Calculation (min): 34 min  Charges: OT General Charges $OT Visit: 1 Visit OT Treatments $Self Care/Home Management : 23-37 mins  Raynald Kemp, OT Acute Rehabilitation Services Pager:  [phone] Office: [phone]   Pilar Grammes 12/16/2020, 12:01 PM

## 2020-12-16 NOTE — Progress Notes (Addendum)
IP rehab admissions:  I met with patient.  He answered all my questions.  But when I called his daughter, all of the patient answers were incorrect.  Daughter says patient has dementia and cannot remember so he fabricates details.  Daughter wants patient to go to a SNF for rehab.  Dtr does not feel that she is physically able to care for patient even after a rehab stay.  I will let case manager know of daughter desire for SNF for rehab.  Call for questions.  [phone]

## 2020-12-16 NOTE — Discharge Instructions (Signed)
   [facility]  Discharge Instructions   Carotid Surgery  Please refer to the following instructions for your post-procedure care. Your surgeon or physician assistant will discuss any changes with you.  Activity  You are encouraged to walk as much as you can. You can slowly return to normal activities but must avoid strenuous activity and heavy lifting until your doctor tell you it's okay. Avoid activities such as vacuuming or swinging a golf club. You can drive after one week if you are comfortable and you are no longer taking prescription pain medications. It is normal to feel tired for serval weeks after your surgery. It is also normal to have difficulty with sleep habits, eating, and bowel movements after surgery. These will go away with time.  Bathing/Showering  Shower daily after you go home. Do not soak in a bathtub, hot tub, or swim until the incision heals completely.  Incision Care  Shower every day. Clean your incision with mild soap and water. Pat the area dry with a clean towel. You do not need a bandage unless otherwise instructed. Do not apply any ointments or creams to your incision. You may have skin glue on your incision. Do not peel it off. It will come off on its own in about one week. Your incision may feel thickened and raised for several weeks after your surgery. This is normal and the skin will soften over time.   For Men Only: It's okay to shave around the incision but do not shave the incision itself for 2 weeks. It is common to have numbness under your chin that could last for several months.  Diet  Resume your normal diet. There are no special food restrictions following this procedure. A low fat/low cholesterol diet is recommended for all patients with vascular disease. In order to heal from your surgery, it is CRITICAL to get adequate nutrition. Your body requires vitamins, minerals, and protein. Vegetables are the best source of  vitamins and minerals. Vegetables also provide the perfect balance of protein. Processed food has little nutritional value, so try to avoid this.  Medications  Resume taking all of your medications unless your doctor or physician assistant tells you not to. If your incision is causing pain, you may take over-the- counter pain relievers such as acetaminophen (Tylenol). If you were prescribed a stronger pain medication, please be aware these medications can cause nausea and constipation. Prevent nausea by taking the medication with a snack or meal. Avoid constipation by drinking plenty of fluids and eating foods with a high amount of fiber, such as fruits, vegetables, and grains.   Do not take Tylenol if you are taking prescription pain medications.  Follow Up  Our office will schedule a follow up appointment 2-3 weeks following discharge.  Please call us immediately for any of the following conditions  . Increased pain, redness, drainage (pus) from your incision site. . Fever of 101 degrees or higher. . If you should develop stroke (slurred speech, difficulty swallowing, weakness on one side of your body, loss of vision) you should call 911 and go to the nearest emergency room. .  Reduce your risk of vascular disease:  . Stop smoking. If you would like help call [facility] at 1-800-QUIT-NOW (1-800-784-8669) or [place] at 336-586-4000. . Manage your cholesterol . Maintain a desired weight . Control your diabetes . Keep your blood pressure down .  If you have any questions, please call the office at [phone]. 

## 2020-12-17 DIAGNOSIS — I712 Thoracic aortic aneurysm, without rupture: Secondary | ICD-10-CM | POA: Diagnosis not present

## 2020-12-17 DIAGNOSIS — F0150 Vascular dementia without behavioral disturbance: Secondary | ICD-10-CM | POA: Diagnosis not present

## 2020-12-17 DIAGNOSIS — I10 Essential (primary) hypertension: Secondary | ICD-10-CM | POA: Diagnosis not present

## 2020-12-17 MED ORDER — ATORVASTATIN CALCIUM 80 MG PO TABS
80.0000 mg | ORAL_TABLET | Freq: Every day | ORAL | Status: DC
  Administered 2020-12-17 – 2020-12-20 (×4): 80 mg via ORAL
  Filled 2020-12-17 (×4): qty 1

## 2020-12-17 NOTE — Progress Notes (Signed)
PROGRESS NOTE    Philip Robinson   YJE:[id number]  DOB: 01-Nov-1951  DOA: 12/10/2020 PCP: Philip Dellen, NP   Brief Narrative:  Philip Robinson is a 70 year old with DM2, HTN, AAA, seizure disorder, vascular dementia was admitted 12/10/2020 with LLE weakness and altered mental status.  Work-up revealed small acute infarct of the watershed area of the right cerebral hemisphere.  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     Subjective: He denies having any complaints at this time.  Assessment & Plan:   Principal Problem:   Acute CVA (cerebrovascular accident)- right cerebral infarct - cont ASA and Plavix per Neuro recommendations - LDL 73- cont statin.  Increasing statin to 80 mg per pharmacy recommendation. - A1c 5.3 - ECHO w/o thrombus, normal EF grade 1 d CHF -   right ICA stenosis- he is s/p right ICA stenting via TCAR on 1/12- f/u planned for 4 wks from now- he appears stable post op - PT/OT recommending CIR vs 24 hr supervision. However, daughter would like patient to go to SNF as daughter is not able to care for him after he returns home  Active Problems:   Seizure disorder - cont Dilantin- level was 18 on 1/8 - follow levels intermittently -  last seizure was on 1/3 and dilantin dose was modified - due to acute CVA, he is now a higher risk for seizures    Essential hypertension - holding Amlodipine, Labetalol, Hydralazine and Lisinopril in setting of acute CVA to allow permissive HTN -Continue to monitor blood pressure closely.    Ascending aortic aneurysm   - 4 cm per last imaging - needs close outpt follow up    Dementia  - on Aricept     Nicotine abuse - cont Nicoderm patch    Diabetes mellitus without complication - last A1c 5.3- not on medications for DM per his home med list- based on this A1c, he is not diabetic     Overweight (BMI 25.0-29.9) - Body mass index is 28.11 kg/m.   Time spent in minutes: 25 DVT prophylaxis: SCD's Start:  12/15/20 1605 enoxaparin (LOVENOX) injection 40 mg Start: 12/11/20 0830  Code Status: Full code Family Communication: No family at bedside.  Unable to reach at this time. Disposition Plan:  Status is: Inpatient  Remains inpatient appropriate because:Awaiting CIR bed, however daughter apparently requesting SNF.   Dispo: The patient is from: Home              Anticipated d/c is to: CIR              Anticipated d/c date is: > 3 days              Patient currently is medically stable to d/c.   Consultants:   Neurology  Vascular surgery Procedures:   TCAR Antimicrobials:  Anti-infectives (From admission, onward)   Start     Dose/Rate Route Frequency Ordered Stop   12/15/20 1700  ceFAZolin (ANCEF) IVPB 2g/100 mL premix        2 g 200 mL/hr over 30 Minutes Intravenous Every 8 hours 12/15/20 1604 12/16/20 0039       Objective: Vitals:   12/17/20 0312 12/17/20 0336 12/17/20 0741 12/17/20 1206  BP: (!) 170/90 (!) 170/90 (!) 175/87 (!) 144/97  Pulse: 68 68 (!) 57 62  Resp: (!) 26 18 16 17   Temp: 98.1 F (36.7 C) 98.1 F (36.7 C) 97.7 F (36.5 C) 98.4 F (36.9 C)  TempSrc: Oral  Oral Oral  SpO2: 96%  96% 100%  Weight:      Height:        Intake/Output Summary (Last 24 hours) at 12/17/2020 1505 Last data filed at 12/17/2020 1300 Gross per 24 hour  Intake 240 ml  Output 400 ml  Net -160 ml   Filed Weights   12/10/20 1830  Weight: 94 kg    Examination: General exam: Appears comfortable  HEENT: PERRLA, oral mucosa moist, no sclera icterus or thrush Respiratory system: Clear to auscultation. Respiratory effort normal. Cardiovascular system: S1, S2 Gastrointestinal system: Abdomen soft, non-tender, nondistended. Normal bowel sounds. Central nervous system: Oriented x2, knows he is in the hospital but does not know the name.  Does not know the year. No focal neurological deficits. Extremities: No cyanosis, clubbing or edema Skin: No rashes or ulcers Psychiatry:   Mood & affect appropriate.     Data Reviewed: I have personally reviewed following labs and imaging studies  CBC: Recent Labs  Lab 12/10/20 1937 12/13/20 0748 12/14/20 0812 12/16/20 0102  WBC 4.5 3.3* 3.6* 5.1  NEUTROABS 2.6  --   --   --   HGB 14.9 15.0 14.4 14.7  HCT 45.6 43.8 42.3 41.9  MCV 101.8* 95.8 96.1 96.5  PLT 206 193 189 188   Basic Metabolic Panel: Recent Labs  Lab 12/10/20 1937 12/13/20 0748 12/14/20 0812 12/15/20 0324 12/16/20 0102  NA 141 135 137 135 138  K 4.5 3.6 3.8 4.0 4.0  CL 103 101 102 103 102  CO2 26 22 25 25 26   GLUCOSE 106* 75 88 85 108*  BUN 18 12 16 15 12   CREATININE 1.06 0.89 1.11 1.03 1.00  CALCIUM 9.2 8.9 8.9 8.6* 8.7*  MG  --  1.8  --   --   --    GFR: Estimated Creatinine Clearance: 80.7 mL/min (by C-G formula based on SCr of 1 mg/dL). Liver Function Tests: Recent Labs  Lab 12/10/20 1937  AST 18  ALT 15  ALKPHOS 98  BILITOT 0.8  PROT 7.5  ALBUMIN 3.8   No results for input(s): LIPASE, AMYLASE in the last 168 hours. Recent Labs  Lab 12/10/20 1937  AMMONIA 30   Coagulation Profile: No results for input(s): INR, PROTIME in the last 168 hours. Cardiac Enzymes: No results for input(s): CKTOTAL, CKMB, CKMBINDEX, TROPONINI in the last 168 hours. BNP (last 3 results) No results for input(s): PROBNP in the last 8760 hours. HbA1C: No results for input(s): HGBA1C in the last 72 hours. CBG: Recent Labs  Lab 12/14/20 0823 12/14/20 1131 12/14/20 1610 12/15/20 1117 12/15/20 1610  GLUCAP 97 194* 133* 140* 136*   Lipid Profile: No results for input(s): CHOL, HDL, LDLCALC, TRIG, CHOLHDL, LDLDIRECT in the last 72 hours. Thyroid Function Tests: No results for input(s): TSH, T4TOTAL, FREET4, T3FREE, THYROIDAB in the last 72 hours. Anemia Panel: No results for input(s): VITAMINB12, FOLATE, FERRITIN, TIBC, IRON, RETICCTPCT in the last 72 hours. Urine analysis:    Component Value Date/Time   COLORURINE YELLOW 12/10/2020 1843    APPEARANCEUR CLEAR 12/10/2020 1843   LABSPEC 1.027 12/10/2020 1843   PHURINE 5.0 12/10/2020 1843   GLUCOSEU NEGATIVE 12/10/2020 1843   HGBUR NEGATIVE 12/10/2020 1843   BILIRUBINUR NEGATIVE 12/10/2020 1843   KETONESUR NEGATIVE 12/10/2020 1843   PROTEINUR NEGATIVE 12/10/2020 1843   NITRITE NEGATIVE 12/10/2020 1843   LEUKOCYTESUR NEGATIVE 12/10/2020 1843   Sepsis Labs: @LABRCNTIP (procalcitonin:4,lacticidven:4) ) Recent Results (from the past 240 hour(s))  Resp Panel by RT-PCR (  Flu A&B, Covid) Nasopharyngeal Swab     Status: None   Collection Time: 12/11/20 12:04 AM   Specimen: Nasopharyngeal Swab; Nasopharyngeal(NP) swabs in vial transport medium  Result Value Ref Range Status   SARS Coronavirus 2 by RT PCR NEGATIVE NEGATIVE Final    Comment: (NOTE) SARS-CoV-2 target nucleic acids are NOT DETECTED.  The SARS-CoV-2 RNA is generally detectable in upper respiratory specimens during the acute phase of infection. The lowest concentration of SARS-CoV-2 viral copies this assay can detect is 138 copies/mL. A negative result does not preclude SARS-Cov-2 infection and should not be used as the sole basis for treatment or other patient management decisions. A negative result may occur with  improper specimen collection/handling, submission of specimen other than nasopharyngeal swab, presence of viral mutation(s) within the areas targeted by this assay, and inadequate number of viral copies(<138 copies/mL). A negative result must be combined with clinical observations, patient history, and epidemiological information. The expected result is Negative.  Fact Sheet for Patients:  BloggerCourse.comhttps://www.fda.gov/media/152166/download  Fact Sheet for Healthcare Providers:  SeriousBroker.ithttps://www.fda.gov/media/152162/download  This test is no t yet approved or cleared by the [place] FDA and  has been authorized for detection and/or diagnosis of SARS-CoV-2 by FDA under an Emergency Use Authorization (EUA).  This EUA will remain  in effect (meaning this test can be used) for the duration of the COVID-19 declaration under Section 564(b)(1) of the Act, 21 U.S.C.section 360bbb-3(b)(1), unless the authorization is terminated  or revoked sooner.       Influenza A by PCR NEGATIVE NEGATIVE Final   Influenza B by PCR NEGATIVE NEGATIVE Final    Comment: (NOTE) The Xpert Xpress SARS-CoV-2/FLU/RSV plus assay is intended as an aid in the diagnosis of influenza from Nasopharyngeal swab specimens and should not be used as a sole basis for treatment. Nasal washings and aspirates are unacceptable for Xpert Xpress SARS-CoV-2/FLU/RSV testing.  Fact Sheet for Patients: BloggerCourse.comhttps://www.fda.gov/media/152166/download  Fact Sheet for Healthcare Providers: SeriousBroker.ithttps://www.fda.gov/media/152162/download  This test is not yet approved or cleared by the [place] FDA and has been authorized for detection and/or diagnosis of SARS-CoV-2 by FDA under an Emergency Use Authorization (EUA). This EUA will remain in effect (meaning this test can be used) for the duration of the COVID-19 declaration under Section 564(b)(1) of the Act, 21 U.S.C. section 360bbb-3(b)(1), unless the authorization is terminated or revoked.  Performed at [facility], [address] [place], [place] [address]   SARS Coronavirus 2 by RT PCR (hospital order, performed in [facility] lab)     Status: None   Collection Time: 12/14/20  3:20 AM  Result Value Ref Range Status   SARS Coronavirus 2 NEGATIVE NEGATIVE Final    Comment: (NOTE) SARS-CoV-2 target nucleic acids are NOT DETECTED.  The SARS-CoV-2 RNA is generally detectable in upper and lower respiratory specimens during the acute phase of infection. The lowest concentration of SARS-CoV-2 viral copies this assay can detect is 250 copies / mL. A negative result does not preclude SARS-CoV-2 infection and should not be used as the sole basis for treatment or other patient  management decisions.  A negative result may occur with improper specimen collection / handling, submission of specimen other than nasopharyngeal swab, presence of viral mutation(s) within the areas targeted by this assay, and inadequate number of viral copies (<250 copies / mL). A negative result must be combined with clinical observations, patient history, and epidemiological information.  Fact Sheet for Patients:   BoilerBrush.com.cyhttps://www.fda.gov/media/136312/download  Fact Sheet  for Healthcare Providers: https://pope.com/  This test is not yet approved or  cleared by the [place] and has been authorized for detection and/or diagnosis of SARS-CoV-2 by FDA under an Emergency Use Authorization (EUA).  This EUA will remain in effect (meaning this test can be used) for the duration of the COVID-19 declaration under Section 564(b)(1) of the Act, 21 U.S.C. section 360bbb-3(b)(1), unless the authorization is terminated or revoked sooner.  Performed at [facility], [address] [place], [place] [address]          Radiology Studies: No results found.    Scheduled Meds: . aspirin  325 mg Oral Daily  . atorvastatin  40 mg Oral QHS  . clopidogrel  75 mg Oral Daily  . donepezil  5 mg Oral Q lunch  . enoxaparin (LOVENOX) injection  40 mg Subcutaneous Q24H  . mirabegron ER  50 mg Oral Q lunch  . mirtazapine  7.5 mg Oral Q lunch  . nicotine  14 mg Transdermal Daily  . phenytoin  200 mg Oral BID  . polyethylene glycol  17 g Oral BID  . senna  1 tablet Oral Daily  . sertraline  100 mg Oral Q lunch  . tamsulosin  0.4 mg Oral Q lunch   Continuous Infusions: . sodium chloride    . magnesium sulfate bolus IVPB       LOS: 6 days      Vonzella Nipple, MD [facility] Pager:on [email or web address] 12/17/2020, 3:05 PM

## 2020-12-17 NOTE — Progress Notes (Addendum)
  Progress Note    12/17/2020 8:10 AM 2 Days Post-Op  Subjective:  No complaints   Vitals:   12/17/20 0336 12/17/20 0741  BP: (!) 170/90 (!) 175/87  Pulse: 68 (!) 57  Resp: 18 16  Temp: 98.1 F (36.7 C) 97.7 F (36.5 C)  SpO2:  96%   Physical Exam: Cardiac: regular Lungs: nonlabored Incisions: right neck incision is clean, dry and intact without swelling or edema Extremities: moving all extremities without deficits Neurologic: CN intact. Tongue midline. Smile symmetric. Speech coherent  CBC    Component Value Date/Time   WBC 5.1 12/16/2020 0102   RBC 4.34 12/16/2020 0102   HGB 14.7 12/16/2020 0102   HCT 41.9 12/16/2020 0102   PLT 188 12/16/2020 0102   MCV 96.5 12/16/2020 0102   MCH 33.9 12/16/2020 0102   MCHC 35.1 12/16/2020 0102   RDW 11.6 12/16/2020 0102   LYMPHSABS 1.0 12/10/2020 1937   MONOABS 0.6 12/10/2020 1937   EOSABS 0.2 12/10/2020 1937   BASOSABS 0.0 12/10/2020 1937    BMET    Component Value Date/Time   NA 138 12/16/2020 0102   K 4.0 12/16/2020 0102   CL 102 12/16/2020 0102   CO2 26 12/16/2020 0102   GLUCOSE 108 (H) 12/16/2020 0102   BUN 12 12/16/2020 0102   CREATININE 1.00 12/16/2020 0102   CALCIUM 8.7 (L) 12/16/2020 0102   GFRNONAA >60 12/16/2020 0102   GFRAA >60 07/29/2020 1535    INR No results found for: INR   Intake/Output Summary (Last 24 hours) at 12/17/2020 0810 Last data filed at 12/17/2020 9604 Gross per 24 hour  Intake --  Output 400 ml  Net -400 ml     Assessment/Plan:  70 y.o. male is s/p right TCAR  2 Days Post-Op  Looks good this morning and appears neurologically intact and moving all extremities. Continue to mobilize as tolerated.  Will need aspirin, Plavix, statin at discharge.  Can be discharged from vascular standpoint. Daughter is requesting SNF placement. He will follow up with Dr. Chestine Spore in 4 weeks with a carotid duplex.   DVT prophylaxis: lovenox   Graceann Congress, PA-C [facility] [phone] 12/17/2020 8:10 AM   I have seen and evaluated the patient. I agree with the PA note as documented above.  Postop day 2 status post right TCAR.  Again appears neurologically intact and baseline from his preop exam.  Incisions look ok.  Will need aspirin Plavix statin at discharge.  Daughter is requesting SNF.  Follow-up arranged in 4 weeks with carotid duplex with me in the office.  Can be discharged from my standpoint  Cephus Shelling, MD [facility]: [phone]

## 2020-12-17 NOTE — Progress Notes (Signed)
Pt is getting out of bed trying to leave, multiple redirections given. Pt is given a bath, it seems to be calm for now. We'll continue to monitor.

## 2020-12-17 NOTE — Care Management Important Message (Signed)
Important Message  Patient Details  Name: Philip Robinson MRN: [id number] Date of Birth: August 18, 1951   Medicare Important Message Given:  Yes     Renie Ora 12/17/2020, 1:15 PM

## 2020-12-17 NOTE — Progress Notes (Signed)
Inpatient Rehabilitation-Admissions Coordinator   Please see note from my coworker, Lelon Frohlich, on 12/17/19. AC will sign off as plan is for SNF.   Cheri Rous, OTR/L  Rehab Admissions Coordinator  [phone] 12/17/2020 10:28 AM

## 2020-12-18 DIAGNOSIS — F0150 Vascular dementia without behavioral disturbance: Secondary | ICD-10-CM | POA: Diagnosis not present

## 2020-12-18 DIAGNOSIS — I10 Essential (primary) hypertension: Secondary | ICD-10-CM | POA: Diagnosis not present

## 2020-12-18 DIAGNOSIS — I712 Thoracic aortic aneurysm, without rupture: Secondary | ICD-10-CM | POA: Diagnosis not present

## 2020-12-18 LAB — BASIC METABOLIC PANEL
Anion gap: 9 (ref 5–15)
BUN: 16 mg/dL (ref 8–23)
CO2: 24 mmol/L (ref 22–32)
Calcium: 8.7 mg/dL — ABNORMAL LOW (ref 8.9–10.3)
Chloride: 101 mmol/L (ref 98–111)
Creatinine, Ser: 0.91 mg/dL (ref 0.61–1.24)
GFR, Estimated: >60 mL/min (ref >60)
Glucose, Bld: 92 mg/dL (ref 70–99)
Potassium: 4.5 mmol/L (ref 3.5–5.1)
Sodium: 134 mmol/L — ABNORMAL LOW (ref 135–145)

## 2020-12-18 MED ORDER — AMLODIPINE BESYLATE 10 MG PO TABS
10.0000 mg | ORAL_TABLET | Freq: Every day | ORAL | Status: DC
  Administered 2020-12-18 – 2020-12-20 (×3): 10 mg via ORAL
  Filled 2020-12-18 (×2): qty 1

## 2020-12-18 MED ORDER — LABETALOL HCL 100 MG PO TABS
50.0000 mg | ORAL_TABLET | Freq: Two times a day (BID) | ORAL | Status: DC
Start: 2020-12-18 — End: 2020-12-21
  Administered 2020-12-18 – 2020-12-20 (×6): 50 mg via ORAL
  Filled 2020-12-18 (×6): qty 1

## 2020-12-18 MED ORDER — LISINOPRIL 10 MG PO TABS
20.0000 mg | ORAL_TABLET | Freq: Every day | ORAL | Status: DC
  Administered 2020-12-18 – 2020-12-20 (×3): 20 mg via ORAL
  Filled 2020-12-18 (×3): qty 2

## 2020-12-18 NOTE — Progress Notes (Signed)
Pt had a 17 bits of V-tach, pt is asymptomatic incoming RN aware. We'll continue to monitor.

## 2020-12-18 NOTE — Social Work (Signed)
CSW attempted to reach pt's daughter about the SNF recommendation due to pt's orientation however had to leave a VM.

## 2020-12-18 NOTE — TOC Initial Note (Signed)
Transition of Care [facility]) - Initial/Assessment Note    Patient Details  Name: Philip Robinson MRN: [id number] Date of Birth: 05/19/1951  Transition of [facility]) CM/SW Contact:    Patrice Paradise, LCSW Phone Number: [phone] 12/18/2020, 9:15 PM  Clinical Narrative:                  CSW received a return call from pt's daughter Rinaldo Cloud and was able to review the recommendation of a SNF. Due to pt's orientation CSW reviewed the SNF process and received permission to fax referral out to local facilities. Rinaldo Cloud explained that pt lived with her. CSW reviewed the process and received permission to fax out to local facilities. CSW provided Rinaldo Cloud with the medicare.gov rating website to review ratings of facilities. Patient is not vaccinated.  TOC team will continue to assist with discharge planning needs.    Expected Discharge Plan: Skilled Nursing Facility Barriers to Discharge: Continued Medical Work up,SNF Pending bed offer   Patient Goals and CMS Choice   CMS Medicare.gov Compare Post Acute Care list provided to:: Other (Comment Required) (Daughter) Choice offered to / list presented to : Adult Children  Expected Discharge Plan and Services Expected Discharge Plan: Skilled Nursing Facility       Living arrangements for the past 2 months: Single Family Home                                      Prior Living Arrangements/Services Living arrangements for the past 2 months: Single Family Home Lives with:: Adult Children                   Activities of Daily Living Home Assistive Devices/Equipment: Eyeglasses ADL Screening (condition at time of admission) Patient's cognitive ability adequate to safely complete daily activities?: No Is the patient deaf or have difficulty hearing?: No Does the patient have difficulty seeing, even when wearing glasses/contacts?: No Does the patient have difficulty concentrating, remembering, or making decisions?: Yes Patient able to  express need for assistance with ADLs?: Yes Does the patient have difficulty dressing or bathing?: Yes Independently performs ADLs?: No Communication: Needs assistance Is this a change from baseline?: Pre-admission baseline Dressing (OT): Needs assistance Is this a change from baseline?: Pre-admission baseline Grooming: Needs assistance Is this a change from baseline?: Pre-admission baseline Feeding: Needs assistance Is this a change from baseline?: Pre-admission baseline Bathing: Needs assistance Is this a change from baseline?: Pre-admission baseline Toileting: Needs assistance Is this a change from baseline?: Pre-admission baseline In/Out Bed: Needs assistance Is this a change from baseline?: Pre-admission baseline Walks in Home: Needs assistance Is this a change from baseline?: Pre-admission baseline Does the patient have difficulty walking or climbing stairs?: Yes Weakness of Legs: Both Weakness of Arms/Hands: None  Permission Sought/Granted      Share Information with NAME: Rinaldo Cloud  Permission granted to share info w AGENCY: SNFs  Permission granted to share info w Relationship: Daughter  Permission granted to share info w Contact Information: [phone]  Emotional Assessment   Attitude/Demeanor/Rapport: Unable to Assess Affect (typically observed): Unable to Assess Orientation: : Oriented to Self,Oriented to Place,Fluctuating Orientation (Suspected and/or reported Sundowners) Alcohol / Substance Use: Tobacco Use    Admission diagnosis:  Weakness [R53.1] Acute CVA (cerebrovascular accident) (HCC) [I63.9] Cerebrovascular accident (CVA), unspecified mechanism (HCC) [I63.9] Patient Active Problem List   Diagnosis Date Noted  . Overweight (  BMI 25.0-29.9) 12/12/2020  . Acute CVA (cerebrovascular accident) (HCC) 12/11/2020  . Status epilepticus (HCC) 12/05/2020  . Pain due to onychomycosis of toenails of both feet 12/10/2019  . Diabetes mellitus without complication  (HCC) 12/10/2019  . Aortic dissection (HCC) 03/07/2019  . Ascending aortic aneurysm (HCC) 03/07/2019  . 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 Vascular dementia (HCC) 03/07/2019  . Nicotine abuse 03/07/2019  . CVA (cerebral vascular accident) (HCC) 03/07/2019  . Adjustment disorder with mixed anxiety and depressed mood 05/27/2015  . Essential hypertension   . Hypokalemia   . Seizure (HCC) 05/25/2015  . Acute encephalopathy 05/25/2015  . Delirium    PCP:  Dinges, Roanna Banning, NP Pharmacy:   [facility] - [place], [place] - [address] [place] [address] Phone: [phone] Fax: [phone]  [facility] - [place], [place] - [address] [place] [address] Phone: [phone] Fax: [phone]     Social Determinants of Health (SDOH) Interventions    Readmission Risk Interventions No flowsheet data found.

## 2020-12-18 NOTE — Progress Notes (Signed)
[facility]  PROGRESS NOTE  Tashawn Laswell LEX:[id number] DOB: 23-Nov-1951 DOA: 12/10/2020 PCP: Hermelinda Dellen, NP   Brief HPI:   70 year old male with medical history of diabetes mellitus type 2, hypertension, AAA, seizure disorder, vascular dementia who was admitted on 12/10/2020 with left lower extremity weakness, altered mental status.  Work-up showed acute acute infarct of the watershed area of the right cerebral hemisphere.  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  Patient underwent right ICA stenting via TCAR on 12/15/2020.    Subjective   Patient seen and examined, denies any complaints.   Assessment/Plan:     1. 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  Continue aspirin and Plavix.  Continue atorvastatin 80 mg daily.  Echocardiogram showed no thrombus.  Patient to follow-up with neurology as outpatient. 2. Seizure disorder-continue Dilantin, level was 18 on 12/11/2020.  Follow Dilantin levels intermittently.  Last seizure was on 12/06/2020 and Dilantin dose was modified. 3. Hypertension-blood pressure is now elevated, antihypertensive medications were on hold.  Restart home medications including Labetalol, Norvasc, Lisinopril. 4. Ascending aortic aneurysm-4 cm per last imaging.  Needs close outpatient follow-up. 5. Dementia-continue Aricept, mobility disturbance. 6. ?  Diabetes mellitus type 2-last hemoglobin A1c 5.3.  Patient is not diabetic but hemoglobin A1c.      COVID-19 Labs  No results for input(s): DDIMER, FERRITIN, LDH, CRP in the last 72 hours.  Lab Results  Component Value Date   SARSCOV2NAA NEGATIVE 12/14/2020   SARSCOV2NAA NEGATIVE 12/11/2020   SARSCOV2NAA NEGATIVE 12/05/2020   SARSCOV2NAA NEGATIVE 10/22/2020     Scheduled medications:   . aspirin  325 mg Oral Daily  . atorvastatin  80 mg Oral QHS  . clopidogrel  75 mg Oral Daily  . donepezil  5 mg Oral Q lunch  . enoxaparin  (LOVENOX) injection  40 mg Subcutaneous Q24H  . mirabegron ER  50 mg Oral Q lunch  . mirtazapine  7.5 mg Oral Q lunch  . nicotine  14 mg Transdermal Daily  . phenytoin  200 mg Oral BID  . polyethylene glycol  17 g Oral BID  . senna  1 tablet Oral Daily  . sertraline  100 mg Oral Q lunch  . tamsulosin  0.4 mg Oral Q lunch         CBG: Recent Labs  Lab 12/14/20 0823 12/14/20 1131 12/14/20 1610 12/15/20 1117 12/15/20 1610  GLUCAP 97 194* 133* 140* 136*    SpO2: 100 % O2 Flow Rate (L/min): 2 L/min    CBC: Recent Labs  Lab 12/13/20 0748 12/14/20 0812 12/16/20 0102  WBC 3.3* 3.6* 5.1  HGB 15.0 14.4 14.7  HCT 43.8 42.3 41.9  MCV 95.8 96.1 96.5  PLT 193 189 188    Basic Metabolic Panel: Recent Labs  Lab 12/13/20 0748 12/14/20 0812 12/15/20 0324 12/16/20 0102 12/18/20 0112  NA 135 137 135 138 134*  K 3.6 3.8 4.0 4.0 4.5  CL 101 102 103 102 101  CO2 22 25 25 26 24   GLUCOSE 75 88 85 108* 92  BUN 12 16 15 12 16   CREATININE 0.89 1.11 1.03 1.00 0.91  CALCIUM 8.9 8.9 8.6* 8.7* 8.7*  MG 1.8  --   --   --   --        Antibiotics: Anti-infectives (From admission, onward)   Start     Dose/Rate Route Frequency Ordered Stop   12/15/20 1700  ceFAZolin (ANCEF) IVPB  2g/100 mL premix        2 g 200 mL/hr over 30 Minutes Intravenous Every 8 hours 12/15/20 1604 12/16/20 0039       DVT prophylaxis: SCDs  Code Status: Full code  Family Communication: No family at bedside   Consultants:  Neurology  Vascular surgery  Procedures:  TCAR    Objective   Vitals:   12/17/20 2007 12/17/20 2349 12/18/20 0700 12/18/20 0725  BP: (!) 161/117 (!) 179/150 (!) 180/118 (!) 167/109  Pulse: 70 60 (!) 58 61  Resp: 20 19  16   Temp: 97.6 F (36.4 C) 97.8 F (36.6 C) 97.7 F (36.5 C) 98.5 F (36.9 C)  TempSrc: Oral Oral Oral Axillary  SpO2: 100% 100% 100% 100%  Weight:      Height:        Intake/Output Summary (Last 24 hours) at 12/18/2020 1215 Last data  filed at 12/18/2020 12/20/2020 Gross per 24 hour  Intake 360 ml  Output 350 ml  Net 10 ml    01/13 1901 - 01/15 0700 In: 240 [P.O.:240] Out: 400 [Urine:400]  Filed Weights   12/10/20 1830  Weight: 94 kg    Physical Examination:    General-appears in no acute distress  Heart-S1-S2, regular, no murmur auscultated  Lungs-clear to auscultation bilaterally, no wheezing or crackles auscultated  Abdomen-soft, nontender, no organomegaly  Extremities-no edema in the lower extremities  Neuro-alert, oriented x3, no focal deficit noted   Status is: Inpatient  Dispo: The patient is from: Home              Anticipated d/c is to: Skilled nursing facility              Anticipated d/c date is: 12/21/2020              Patient currently medically stable for discharge  Barrier to discharge-awaiting bed at nursing facility         Data Reviewed:   Recent Results (from the past 240 hour(s))  Resp Panel by RT-PCR (Flu A&B, Covid) Nasopharyngeal Swab     Status: None   Collection Time: 12/11/20 12:04 AM   Specimen: Nasopharyngeal Swab; Nasopharyngeal(NP) swabs in vial transport medium  Result Value Ref Range Status   SARS Coronavirus 2 by RT PCR NEGATIVE NEGATIVE Final    Comment: (NOTE) SARS-CoV-2 target nucleic acids are NOT DETECTED.  The SARS-CoV-2 RNA is generally detectable in upper respiratory specimens during the acute phase of infection. The lowest concentration of SARS-CoV-2 viral copies this assay can detect is 138 copies/mL. A negative result does not preclude SARS-Cov-2 infection and should not be used as the sole basis for treatment or other patient management decisions. A negative result may occur with  improper specimen collection/handling, submission of specimen other than nasopharyngeal swab, presence of viral mutation(s) within the areas targeted by this assay, and inadequate number of viral copies(<138 copies/mL). A negative result must be combined  with clinical observations, patient history, and epidemiological information. The expected result is Negative.  Fact Sheet for Patients:  02/08/21  Fact Sheet for Healthcare Providers:  BloggerCourse.com  This test is no t yet approved or cleared by the [place] FDA and  has been authorized for detection and/or diagnosis of SARS-CoV-2 by FDA under an Emergency Use Authorization (EUA). This EUA will remain  in effect (meaning this test can be used) for the duration of the COVID-19 declaration under Section 564(b)(1) of the Act, 21 U.S.C.section 360bbb-3(b)(1), unless the authorization is terminated  or revoked sooner.       Influenza A by PCR NEGATIVE NEGATIVE Final   Influenza B by PCR NEGATIVE NEGATIVE Final    Comment: (NOTE) The Xpert Xpress SARS-CoV-2/FLU/RSV plus assay is intended as an aid in the diagnosis of influenza from Nasopharyngeal swab specimens and should not be used as a sole basis for treatment. Nasal washings and aspirates are unacceptable for Xpert Xpress SARS-CoV-2/FLU/RSV testing.  Fact Sheet for Patients: BloggerCourse.com  Fact Sheet for Healthcare Providers: SeriousBroker.it  This test is not yet approved or cleared by the [place] FDA and has been authorized for detection and/or diagnosis of SARS-CoV-2 by FDA under an Emergency Use Authorization (EUA). This EUA will remain in effect (meaning this test can be used) for the duration of the COVID-19 declaration under Section 564(b)(1) of the Act, 21 U.S.C. section 360bbb-3(b)(1), unless the authorization is terminated or revoked.  Performed at [facility], [address], [place], [place] [address]   SARS Coronavirus 2 by RT PCR (hospital order, performed in [facility] hospital lab)     Status: None   Collection Time: 12/14/20  3:20 AM  Result Value Ref Range Status   SARS  Coronavirus 2 NEGATIVE NEGATIVE Final    Comment: (NOTE) SARS-CoV-2 target nucleic acids are NOT DETECTED.  The SARS-CoV-2 RNA is generally detectable in upper and lower respiratory specimens during the acute phase of infection. The lowest concentration of SARS-CoV-2 viral copies this assay can detect is 250 copies / mL. A negative result does not preclude SARS-CoV-2 infection and should not be used as the sole basis for treatment or other patient management decisions.  A negative result may occur with improper specimen collection / handling, submission of specimen other than nasopharyngeal swab, presence of viral mutation(s) within the areas targeted by this assay, and inadequate number of viral copies (<250 copies / mL). A negative result must be combined with clinical observations, patient history, and epidemiological information.  Fact Sheet for Patients:   BoilerBrush.com.cy  Fact Sheet for Healthcare Providers: https://pope.com/  This test is not yet approved or  cleared by the [place] FDA and has been authorized for detection and/or diagnosis of SARS-CoV-2 by FDA under an Emergency Use Authorization (EUA).  This EUA will remain in effect (meaning this test can be used) for the duration of the COVID-19 declaration under Section 564(b)(1) of the Act, 21 U.S.C. section 360bbb-3(b)(1), unless the authorization is terminated or revoked sooner.  Performed at [facility], [address], [place], [place] [address]     BNP (last 3 results) Recent Labs    10/22/20 0058  BNP 101.4*        Jannine Abreu S Juliahna Wiswell   [facility] If 7PM-7AM, please contact night-coverage at [email or web address], Office  [phone]   12/18/2020, 12:15 PM  LOS: 7 days

## 2020-12-19 DIAGNOSIS — I712 Thoracic aortic aneurysm, without rupture: Secondary | ICD-10-CM | POA: Diagnosis not present

## 2020-12-19 DIAGNOSIS — I10 Essential (primary) hypertension: Secondary | ICD-10-CM | POA: Diagnosis not present

## 2020-12-19 DIAGNOSIS — F0150 Vascular dementia without behavioral disturbance: Secondary | ICD-10-CM | POA: Diagnosis not present

## 2020-12-19 NOTE — TOC Progression Note (Signed)
Transition of Care [facility]) - Progression Note    Patient Details  Name: Philip Robinson MRN: [id number] Date of Birth: 1951/01/26  Transition of Care [facility]) CM/SW Contact  Eduard Roux, [place] Phone Number: 12/19/2020, 12:40 PM  Clinical Narrative:     PSARR # pending   Antony Blackbird, MSW, LCSW Clinical Social Worker   Expected Discharge Plan: Skilled Nursing Facility Barriers to Discharge: Continued Medical Work up,SNF Pending bed offer  Expected Discharge Plan and Services Expected Discharge Plan: Skilled Nursing Facility       Living arrangements for the past 2 months: Single Family Home                                       Social Determinants of Health (SDOH) Interventions    Readmission Risk Interventions No flowsheet data found.

## 2020-12-19 NOTE — NC FL2 (Signed)
[place] MEDICAID FL2 LEVEL OF CARE SCREENING TOOL     IDENTIFICATION  Patient Name: Philip Robinson Birthdate: [birth year] Sex: male Admission Date (Current Location): 12/10/2020  [facility] and [place] Number:  Producer, television/film/video and Address:  [facility] [place], [address] [place], [place] [address]      Provider Number: [id number]  Attending Physician Name and Address:  Meredeth Ide, MD  Relative Name and Phone Number:       Current Level of Care: Hospital Recommended Level of Care: Skilled Nursing Facility Prior Approval Number:    Date Approved/Denied:   PASRR Number: pending  Discharge Plan: SNF    Current Diagnoses: Patient Active Problem List   Diagnosis Date Noted  . Overweight (BMI 25.0-29.9) 12/12/2020  . Acute CVA (cerebrovascular accident) (HCC) 12/11/2020  . Status epilepticus (HCC) 12/05/2020  . Pain due to onychomycosis of toenails of both feet 12/10/2019  . Diabetes mellitus without complication (HCC) 12/10/2019  . Aortic dissection (HCC) 03/07/2019  . Ascending aortic aneurysm (HCC) 03/07/2019  . 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 Vascular dementia (HCC) 03/07/2019  . Nicotine abuse 03/07/2019  . CVA (cerebral vascular accident) (HCC) 03/07/2019  . Adjustment disorder with mixed anxiety and depressed mood 05/27/2015  . Essential hypertension   . Hypokalemia   . Seizure (HCC) 05/25/2015  . Acute encephalopathy 05/25/2015  . Delirium     Orientation RESPIRATION BLADDER Height & Weight     Self  Normal Incontinent Weight: 207 lb 3.7 oz (94 kg) Height:  6' (182.9 cm)  BEHAVIORAL SYMPTOMS/MOOD NEUROLOGICAL BOWEL NUTRITION STATUS      Incontinent Diet (please see discharge summary)  AMBULATORY STATUS COMMUNICATION OF NEEDS Skin   Supervision Verbally Surgical wounds (closed incision LF groin, closed incision Neck,right)                       Personal Care Assistance Level of Assistance   Bathing,Feeding,Dressing Bathing Assistance: Limited assistance Feeding assistance: Independent Dressing Assistance: Limited assistance     Functional Limitations Info  Sight,Hearing,Speech Sight Info: Adequate Hearing Info: Adequate Speech Info: Adequate    SPECIAL CARE FACTORS FREQUENCY  PT (By licensed PT),OT (By licensed OT)     PT Frequency: 5x per wek OT Frequency: 5x per week            Contractures Contractures Info: Not present    Additional Factors Info  Code Status,Allergies Code Status Info: FULL Allergies Info: NKA           Current Medications (12/19/2020):  This is the current hospital active medication list Current Facility-Administered Medications  Medication Dose Route Frequency Provider Last Rate Last Admin  . 0.9 %  sodium chloride infusion  500 mL Intravenous Once PRN Setzer, Lynnell Jude, PA-C      . acetaminophen (TYLENOL) tablet 650 mg  650 mg Oral Q4H PRN Milinda Antis, PA-C   650 mg at 12/18/20 2100   Or  . acetaminophen (TYLENOL) 160 MG/5ML solution 650 mg  650 mg Per Tube Q4H PRN Milinda Antis, PA-C       Or  . acetaminophen (TYLENOL) suppository 650 mg  650 mg Rectal Q4H PRN Setzer, Lynnell Jude, PA-C      . amLODipine (NORVASC) tablet 10 mg  10 mg Oral Q lunch Meredeth Ide, MD   10 mg at 12/18/20 1238  . aspirin tablet 325 mg  325 mg Oral Daily Setzer, Lynnell Jude, PA-C  325 mg at 12/19/20 0912  . atorvastatin (LIPITOR) tablet 80 mg  80 mg Oral QHS Matcha, Anupama, MD   80 mg at 12/18/20 2100  . clopidogrel (PLAVIX) tablet 75 mg  75 mg Oral Daily Milinda Antis, PA-C   75 mg at 12/19/20 0912  . donepezil (ARICEPT) tablet 5 mg  5 mg Oral Q lunch Milinda Antis, PA-C   5 mg at 12/18/20 1239  . enoxaparin (LOVENOX) injection 40 mg  40 mg Subcutaneous Q24H Milinda Antis, PA-C   40 mg at 12/19/20 9563  . hydrALAZINE (APRESOLINE) injection 5 mg  5 mg Intravenous Q8H PRN Setzer, Lynnell Jude, PA-C      . labetalol (NORMODYNE) injection 10 mg   10 mg Intravenous Q10 min PRN Milinda Antis, PA-C   10 mg at 12/15/20 1309  . labetalol (NORMODYNE) tablet 50 mg  50 mg Oral BID Meredeth Ide, MD   50 mg at 12/19/20 0912  . lisinopril (ZESTRIL) tablet 20 mg  20 mg Oral Q lunch Meredeth Ide, MD   20 mg at 12/18/20 1238  . magnesium sulfate IVPB 2 g 50 mL  2 g Intravenous Daily PRN Milinda Antis, PA-C      . metoprolol tartrate (LOPRESSOR) injection 2-5 mg  2-5 mg Intravenous Q2H PRN Setzer, Lynnell Jude, PA-C      . mirabegron ER (MYRBETRIQ) tablet 50 mg  50 mg Oral Q lunch Milinda Antis, PA-C   50 mg at 12/18/20 1239  . mirtazapine (REMERON) tablet 7.5 mg  7.5 mg Oral Q lunch Milinda Antis, PA-C   7.5 mg at 12/18/20 1239  . nicotine (NICODERM CQ - dosed in mg/24 hours) patch 14 mg  14 mg Transdermal Daily Milinda Antis, PA-C   14 mg at 12/19/20 0913  . ondansetron (ZOFRAN) injection 4 mg  4 mg Intravenous Q6H PRN Setzer, Lynnell Jude, PA-C      . phenytoin (DILANTIN) ER capsule 200 mg  200 mg Oral BID Milinda Antis, PA-C   200 mg at 12/19/20 0912  . polyethylene glycol (MIRALAX / GLYCOLAX) packet 17 g  17 g Oral BID Milinda Antis, PA-C   17 g at 12/18/20 2101  . potassium chloride SA (KLOR-CON) CR tablet 20-40 mEq  20-40 mEq Oral Daily PRN Setzer, Lynnell Jude, PA-C      . senna (SENOKOT) tablet 8.6 mg  1 tablet Oral Daily Milinda Antis, PA-C   8.6 mg at 12/18/20 1024  . sertraline (ZOLOFT) tablet 100 mg  100 mg Oral Q lunch Milinda Antis, PA-C   100 mg at 12/18/20 1239  . tamsulosin (FLOMAX) capsule 0.4 mg  0.4 mg Oral Q lunch Milinda Antis, PA-C   0.4 mg at 12/18/20 1239     Discharge Medications: Please see discharge summary for a list of discharge medications.  Relevant Imaging Results:  Relevant Lab Results:   Additional Information SSN [id number]   patient has not recived covid vaccines  Eduard Roux, LCSWA

## 2020-12-19 NOTE — Progress Notes (Signed)
[facility]  PROGRESS NOTE  Philip Robinson DUK:[id number] DOB: 30-Apr-1951 DOA: 12/10/2020 PCP: Hermelinda Dellen, NP   Brief HPI:   70 year old male with medical history of diabetes mellitus type 2, hypertension, AAA, seizure disorder, vascular dementia who was admitted on 12/10/2020 with left lower extremity weakness, altered mental status.  Work-up showed acute acute infarct of the watershed area of the right cerebral hemisphere.  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  Patient underwent right ICA stenting via TCAR on 12/15/2020.   Subjective   Patient seen and examined, denies any complaints.   Assessment/Plan:     1. 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  Continue aspirin and Plavix.  Continue atorvastatin 80 mg daily.  Echocardiogram showed no thrombus.  Patient to follow-up with neurology as outpatient. 2. Seizure disorder-continue Dilantin, level was 18 on 12/11/2020.  Follow Dilantin levels intermittently.  Last seizure was on 12/06/2020 and Dilantin dose was modified. 3. Hypertension-blood pressure is better controlled after restarting home medications including Labetalol, Norvasc, Lisinopril. 4. Ascending aortic aneurysm-4 cm per last imaging.  Needs close outpatient follow-up. 5. Dementia-continue Aricept, mobility disturbance. 6. ?  Diabetes mellitus type 2-last hemoglobin A1c 5.3.  Patient is not diabetic but hemoglobin A1c.      COVID-19 Labs  No results for input(Philip): DDIMER, FERRITIN, LDH, CRP in the last 72 hours.  Lab Results  Component Value Date   SARSCOV2NAA NEGATIVE 12/14/2020   SARSCOV2NAA NEGATIVE 12/11/2020   SARSCOV2NAA NEGATIVE 12/05/2020   SARSCOV2NAA NEGATIVE 10/22/2020     Scheduled medications:   . amLODipine  10 mg Oral Q lunch  . aspirin  325 mg Oral Daily  . atorvastatin  80 mg Oral QHS  . clopidogrel  75 mg Oral Daily  . donepezil  5 mg Oral Q lunch  . enoxaparin  (LOVENOX) injection  40 mg Subcutaneous Q24H  . labetalol  50 mg Oral BID  . lisinopril  20 mg Oral Q lunch  . mirabegron ER  50 mg Oral Q lunch  . mirtazapine  7.5 mg Oral Q lunch  . nicotine  14 mg Transdermal Daily  . phenytoin  200 mg Oral BID  . polyethylene glycol  17 g Oral BID  . senna  1 tablet Oral Daily  . sertraline  100 mg Oral Q lunch  . tamsulosin  0.4 mg Oral Q lunch         CBG: Recent Labs  Lab 12/14/20 0823 12/14/20 1131 12/14/20 1610 12/15/20 1117 12/15/20 1610  GLUCAP 97 194* 133* 140* 136*    SpO2: 98 % O2 Flow Rate (L/min): 2 L/min    CBC: Recent Labs  Lab 12/13/20 0748 12/14/20 0812 12/16/20 0102  WBC 3.3* 3.6* 5.1  HGB 15.0 14.4 14.7  HCT 43.8 42.3 41.9  MCV 95.8 96.1 96.5  PLT 193 189 188    Basic Metabolic Panel: Recent Labs  Lab 12/13/20 0748 12/14/20 0812 12/15/20 0324 12/16/20 0102 12/18/20 0112  NA 135 137 135 138 134*  K 3.6 3.8 4.0 4.0 4.5  CL 101 102 103 102 101  CO2 22 25 25 26 24   GLUCOSE 75 88 85 108* 92  BUN 12 16 15 12 16   CREATININE 0.89 1.11 1.03 1.00 0.91  CALCIUM 8.9 8.9 8.6* 8.7* 8.7*  MG 1.8  --   --   --   --        Antibiotics: Anti-infectives (From admission, onward)  Start     Dose/Rate Route Frequency Ordered Stop   12/15/20 1700  ceFAZolin (ANCEF) IVPB 2g/100 mL premix        2 g 200 mL/hr over 30 Minutes Intravenous Every 8 hours 12/15/20 1604 12/16/20 0039       DVT prophylaxis: SCDs  Code Status: Full code  Family Communication: No family at bedside   Consultants:  Neurology  Vascular surgery  Procedures:  TCAR    Objective   Vitals:   12/18/20 1547 12/18/20 1837 12/18/20 1905 12/19/20 0747  BP: (!) 150/103 (!) 132/106 (!) 132/106 (!) 160/83  Pulse: 75 83 83 71  Resp: 17 20 20    Temp: 98.2 F (36.8 C) 98 F (36.7 C) 98 F (36.7 C) 97.7 F (36.5 C)  TempSrc: Axillary Oral  Oral  SpO2: 94% 99% 99% 98%  Weight:      Height:        Intake/Output Summary  (Last 24 hours) at 12/19/2020 1128 Last data filed at 12/19/2020 0747 Gross per 24 hour  Intake 240 ml  Output 1475 ml  Net -1235 ml    01/14 1901 - 01/16 0700 In: 480 [P.O.:480] Out: 825 [Urine:825]  Filed Weights   12/10/20 1830  Weight: 94 kg    Physical Examination:    General-appears in no acute distress  Heart-S1-S2, regular, no murmur auscultated  Lungs-clear to auscultation bilaterally, no wheezing or crackles auscultated  Abdomen-soft, nontender, no organomegaly  Extremities-no edema in the lower extremities  Neuro-alert, oriented x3, no focal deficit noted   Status is: Inpatient  Dispo: The patient is from: Home              Anticipated d/c is to: Skilled nursing facility              Anticipated d/c date is: 12/21/2020              Patient currently medically stable for discharge  Barrier to discharge-awaiting bed at nursing facility         Data Reviewed:   Recent Results (from the past 240 hour(Philip))  Resp Panel by RT-PCR (Flu A&B, Covid) Nasopharyngeal Swab     Status: None   Collection Time: 12/11/20 12:04 AM   Specimen: Nasopharyngeal Swab; Nasopharyngeal(NP) swabs in vial transport medium  Result Value Ref Range Status   SARS Coronavirus 2 by RT PCR NEGATIVE NEGATIVE Final    Comment: (NOTE) SARS-CoV-2 target nucleic acids are NOT DETECTED.  The SARS-CoV-2 RNA is generally detectable in upper respiratory specimens during the acute phase of infection. The lowest concentration of SARS-CoV-2 viral copies this assay can detect is 138 copies/mL. A negative result does not preclude SARS-Cov-2 infection and should not be used as the sole basis for treatment or other patient management decisions. A negative result may occur with  improper specimen collection/handling, submission of specimen other than nasopharyngeal swab, presence of viral mutation(Philip) within the areas targeted by this assay, and inadequate number of viral copies(<138  copies/mL). A negative result must be combined with clinical observations, patient history, and epidemiological information. The expected result is Negative.  Fact Sheet for Patients:  02/08/21  Fact Sheet for Healthcare Providers:  BloggerCourse.com  This test is no t yet approved or cleared by the [place] FDA and  has been authorized for detection and/or diagnosis of SARS-CoV-2 by FDA under an Emergency Use Authorization (EUA). This EUA will remain  in effect (meaning this test can be used) for the duration of  the COVID-19 declaration under Section 564(b)(1) of the Act, 21 U.Philip.C.section 360bbb-3(b)(1), unless the authorization is terminated  or revoked sooner.       Influenza A by PCR NEGATIVE NEGATIVE Final   Influenza B by PCR NEGATIVE NEGATIVE Final    Comment: (NOTE) The Xpert Xpress SARS-CoV-2/FLU/RSV plus assay is intended as an aid in the diagnosis of influenza from Nasopharyngeal swab specimens and should not be used as a sole basis for treatment. Nasal washings and aspirates are unacceptable for Xpert Xpress SARS-CoV-2/FLU/RSV testing.  Fact Sheet for Patients: BloggerCourse.com  Fact Sheet for Healthcare Providers: SeriousBroker.it  This test is not yet approved or cleared by the [place] FDA and has been authorized for detection and/or diagnosis of SARS-CoV-2 by FDA under an Emergency Use Authorization (EUA). This EUA will remain in effect (meaning this test can be used) for the duration of the COVID-19 declaration under Section 564(b)(1) of the Act, 21 U.Philip.C. section 360bbb-3(b)(1), unless the authorization is terminated or revoked.  Performed at [facility], [address] [place], [place] [address]   SARS Coronavirus 2 by RT PCR (hospital order, performed in [facility] hospital lab)     Status: None   Collection Time: 12/14/20   3:20 AM  Result Value Ref Range Status   SARS Coronavirus 2 NEGATIVE NEGATIVE Final    Comment: (NOTE) SARS-CoV-2 target nucleic acids are NOT DETECTED.  The SARS-CoV-2 RNA is generally detectable in upper and lower respiratory specimens during the acute phase of infection. The lowest concentration of SARS-CoV-2 viral copies this assay can detect is 250 copies / mL. A negative result does not preclude SARS-CoV-2 infection and should not be used as the sole basis for treatment or other patient management decisions.  A negative result may occur with improper specimen collection / handling, submission of specimen other than nasopharyngeal swab, presence of viral mutation(Philip) within the areas targeted by this assay, and inadequate number of viral copies (<250 copies / mL). A negative result must be combined with clinical observations, patient history, and epidemiological information.  Fact Sheet for Patients:   BoilerBrush.com.cy  Fact Sheet for Healthcare Providers: https://pope.com/  This test is not yet approved or  cleared by the [place] FDA and has been authorized for detection and/or diagnosis of SARS-CoV-2 by FDA under an Emergency Use Authorization (EUA).  This EUA will remain in effect (meaning this test can be used) for the duration of the COVID-19 declaration under Section 564(b)(1) of the Act, 21 U.Philip.C. section 360bbb-3(b)(1), unless the authorization is terminated or revoked sooner.  Performed at [facility], [address] [place], [place] [address]     BNP (last 3 results) Recent Labs    10/22/20 0058  BNP 101.4*        Philip Robinson Philip Robinson   [facility] If 7PM-7AM, please contact night-coverage at [email or web address], Office  [phone]   12/19/2020, 11:28 AM  LOS: 8 days

## 2020-12-19 NOTE — Social Work (Cosign Needed)
                                                                                                                          Re:  Philip Robinson Date of Birth: [birth year] Date: 12/19/2020  To Whom It May Concern:  Please be advised that the above-named patient will require a short-term nursing home stay-anticipated 30 days or less for rehabilitation and strengthening. The plan is to return home.

## 2020-12-20 DIAGNOSIS — I10 Essential (primary) hypertension: Secondary | ICD-10-CM | POA: Diagnosis not present

## 2020-12-20 DIAGNOSIS — E119 Type 2 diabetes mellitus without complications: Secondary | ICD-10-CM | POA: Diagnosis not present

## 2020-12-20 DIAGNOSIS — I639 Cerebral infarction, unspecified: Secondary | ICD-10-CM | POA: Diagnosis not present

## 2020-12-20 DIAGNOSIS — I712 Thoracic aortic aneurysm, without rupture: Secondary | ICD-10-CM | POA: Diagnosis not present

## 2020-12-20 LAB — SARS CORONAVIRUS 2 (TAT 6-24 HRS): SARS Coronavirus 2: NEGATIVE

## 2020-12-20 MED ORDER — CLOPIDOGREL BISULFATE 75 MG PO TABS: 75.0000 mg | ORAL_TABLET | Freq: Every day | ORAL | 2 refills | Status: AC

## 2020-12-20 MED ORDER — ATORVASTATIN CALCIUM 80 MG PO TABS: 80.0000 mg | ORAL_TABLET | Freq: Every day | ORAL | Status: AC

## 2020-12-20 MED ORDER — ASPIRIN EC 81 MG PO TBEC: 81.0000 mg | DELAYED_RELEASE_TABLET | Freq: Every day | ORAL | 0 refills | Status: AC

## 2020-12-20 MED ORDER — NICOTINE 14 MG/24HR TD PT24: 14.0000 mg | MEDICATED_PATCH | Freq: Every day | TRANSDERMAL | 0 refills | Status: AC

## 2020-12-20 NOTE — Discharge Summary (Signed)
Physician Discharge Summary  Philip Robinson IRJ:[id number] DOB: 06-07-1951 DOA: 12/10/2020  PCP: Hermelinda Dellen, NP  Admit date: 12/10/2020 Discharge date: 12/20/2020  Admitted From: Home  Discharge disposition: SNF   Recommendations for Outpatient Follow-Up:   . Follow up with your primary care provider at the skilled nursing facility in 3 to 5 days. . Follow up  with [facility] neurology associates in 4 weeks.   . Follow-up with vascular surgery Dr. Chestine Spore in 4 weeks .  Will need follow-up for Ascending aortic aneurysm.   Discharge Diagnosis:   Principal Problem:   Acute CVA (cerebrovascular accident) [facility]) Active Problems:   Seizure (HCC)   Essential hypertension   Ascending aortic aneurysm (HCC)   Vascular dementia (HCC)   Nicotine abuse   Diabetes mellitus without complication (HCC)   Overweight (BMI 25.0-29.9)   Discharge Condition: Improved.  Diet recommendation: Low sodium, heart healthy.  Carbohydrate-modified.    Wound care: None.  Code status: Full.   History of Present Illness:   70 year old male with medical history of diabetes mellitus type 2, hypertension, AAA, seizure disorder, vascular dementia was admitted to hospital on 12/10/2020 with left lower extremity weakness and altered mental status. Work-up showed acute acute infarct of the watershed area of the right cerebral hemisphere. 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 Patient underwent right ICA stenting via TCAR on 12/15/2020.  After intervention, patient has been considered stable for rehabilitation.  Hospital Course:   Following conditions were addressed during hospitalization as listed below,  Acute CVA-patient had right-sided watershed infarcts secondary to sided secondary to right ICA stenosis, status post right ICA stenting via TCAR on 12/15/2020. Continue aspirin and Plavix on discharge as recommended.. Continue atorvastatin, changed dose to 80mg .   2 D echocardiogram showed no  thrombus.  Will need to follow-up with neurology and vascular surgery as outpatient.    Seizure disorder-currently on Dilantin.   Last seizure was 12/06/2020 .  Be continued on discharge.  Essential hypertension- On Labetalol, Norvasc, Lisinopril.  This she will be continued on December  Ascending aortic aneurysm-4 cm per last imaging.   Will need outpatient follow-up.  Dementia-continue Aricept  Diabetes mellitus type 2-last hemoglobin A1c 5.3.   Continue diabetic diet.  Disposition.  At this time, patient is stable for disposition to skilled Nursing facility.  Patient will need to follow-up with primary care provider, [place] [facility] and vascular surgery as outpatient  Medical Consultants:    Neurology  Vascular surgery  Procedures:     TCAR  Subjective:   Today, patient seen examined at bedside.Patient states that he is okay.  Denies any dizziness, lightheadedness chest pain shortness of breath or weakness   Discharge Exam:   Vitals:   12/20/20 0820 12/20/20 1216  BP: (!) 151/94 (!) 128/105  Pulse: 67 68  Resp: 18 18  Temp: 98.2 F (36.8 C) 98.6 F (37 C)  SpO2: 100% 96%   Vitals:   12/19/20 1800 12/19/20 2100 12/20/20 0820 12/20/20 1216  BP: (!) 146/105 (!) 143/87 (!) 151/94 (!) 128/105  Pulse: 60 77 67 68  Resp: 15 18 18 18   Temp: 98 F (36.7 C) 98.4 F (36.9 C) 98.2 F (36.8 C) 98.6 F (37 C)  TempSrc: Oral Oral Oral Oral  SpO2: 98% 100% 100% 96%  Weight:      Height:       GENERAL: Patient is alert awake and oriented. Not in obvious distress. HENT: No scleral pallor or icterus. Pupils equally  reactive to light. Oral mucosa is moist NECK: is supple, no gross swelling noted. CHEST: Clear to auscultation. No crackles or wheezes.  Diminished breath sounds bilaterally. CVS: S1 and S2 heard, no murmur. Regular rate and rhythm.  ABDOMEN: Soft, non-tender, bowel sounds are present. EXTREMITIES: No edema. CNS: Cranial nerves are intact. No  focal motor deficits. SKIN: warm and dry without rashes.  The results of significant diagnostics from this hospitalization (including imaging, microbiology, ancillary and laboratory) are listed below for reference.     Diagnostic Studies:   CT ANGIO HEAD W OR WO CONTRAST  Result Date: 12/11/2020 CLINICAL DATA:  Seizure and acute stroke EXAM: CT ANGIOGRAPHY HEAD AND NECK TECHNIQUE: Multidetector CT imaging of the head and neck was performed using the standard protocol during bolus administration of intravenous contrast. Multiplanar CT image reconstructions and MIPs were obtained to evaluate the vascular anatomy. Carotid stenosis measurements (when applicable) are obtained utilizing NASCET criteria, using the distal internal carotid diameter as the denominator. CONTRAST:  75mL OMNIPAQUE IOHEXOL 350 MG/ML SOLN COMPARISON:  Brain MRI from earlier today FINDINGS: CT HEAD FINDINGS Brain: Small acute infarcts by MRI are not perceptible separate from extensive chronic small vessel ischemia in the cerebral white matter. Chronic lacunar infarcts in the left pons and deep gray nuclei. Chronic cortical infarct in the right occipital lobe. No acute hemorrhage, hydrocephalus, or masslike finding. Cerebral volume loss. Vascular: See below Skull: Negative Sinuses: Negative Orbits: Negative Review of the MIP images confirms the above findings CTA NECK FINDINGS Aortic arch: Atheromatous plaque.  4 cm diameter aortic arch. Right carotid system: Diffuse atheromatous wall thickening of the common carotid with mixed density plaque at the bifurcation. Predominately low-density plaque at the bulb creates a short segment string sign with patent channel only measuring 1 mm, at least 75% stenosis. No superimposed dissection or ulceration. Left carotid system: Atheromatous wall thickening of the common carotid with mixed density plaque at the bifurcation. No flow reducing stenosis, ulceration, or dissection. Vertebral arteries:  Proximal subclavian atherosclerosis without ulceration or dissection. High-grade narrowing of the dominant left vertebral origin. Occluded right V1 segment with reconstitution at the distal V1 level and thready flow and less luminal density by the dura. The right vertebral artery gradually increases in size. Skeleton: Spondylosis and degenerative disc narrowing. Posterior longitudinal ligament ossification at C4 to C6, likely contacting the ventral cord at C5. No acute finding. Other neck: Negative Upper chest: Biapical fibrosis. Review of the MIP images confirms the above findings CTA HEAD FINDINGS Anterior circulation: Carotid siphon heavy atheromatous calcification with prominent calcified plaque at the supraclinoid right ICA causing 80% stenosis by measurement. Atheromatous irregularity of MCA vessels diffusely. No proximal branch occlusion, beading, or aneurysm. Posterior circulation: Less intense flow in the right vertebral artery in the setting of proximal occlusion and reconstitution. Atheromatous irregularity of the left V4 segment with mild to moderate distal narrowing. Diffusely patent basilar. PCA branches are atheromatous especially at the right P2 segment. Venous sinuses: Unremarkable in the arterial phase Anatomic variants: None significant Review of the MIP images confirms the above findings IMPRESSION: 1. Severe atheromatous stenosis at the right ICA bulb with short segment string sign. Second flow reducing stenosis in the supraclinoid right ICA. 2. 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 The right vertebral artery gradually reconstitutes but is less intensely enhancing by the level of the basilar. 3. 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 4. No intracranial hemorrhage. Known acute infarcts are not visible separate  from the patient's extensive chronic ischemic injury. Electronically Signed   By: Marnee Spring M.D.   On: 12/11/2020 08:16   CT HEAD WO  CONTRAST  Result Date: 12/10/2020 CLINICAL DATA:  Delirium. Additional history provided: Seizure last week, generalized weakness, increased left leg weakness, episode of stool incontinence today. EXAM: CT HEAD WITHOUT CONTRAST TECHNIQUE: Contiguous axial images were obtained from the base of the skull through the vertex without intravenous contrast. COMPARISON:  Prior head CT examinations 12/05/2020 and earlier. Brain MRI 05/25/2015. FINDINGS: Brain: Mild cerebral and cerebellar atrophy. 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 Redemonstrated chronic lacunar infarcts within the bilateral corona radiata/basal ganglia, thalami, pons and cerebellar hemispheres. Background advanced ill-defined hypoattenuation within the cerebral white matter is nonspecific, but compatible with chronic small vessel ischemic disease. Chronic punctate focus of calcification within the right pons (series 3, image 23). There is no acute intracranial hemorrhage. No acute demarcated cortical infarct. No extra-axial fluid collection. No evidence of intracranial mass. No midline shift. Vascular: No hyperdense vessel.  Atherosclerotic calcifications. Skull: Normal. Negative for fracture or focal lesion. Sinuses/Orbits: Visualized orbits show no acute finding. Small volume frothy secretions within a posterior left ethmoid air cell and within the left sphenoid sinus. Trace bilateral ethmoid sinus mucosal thickening. IMPRESSION: No evidence of acute intracranial abnormality. 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 Redemonstrated chronic lacunar infarcts within the bilateral corona radiata/basal ganglia, thalami, pons and cerebellar hemispheres. Stable background mild generalized atrophy of the brain and advanced cerebral white matter chronic small vessel ischemic disease. Mild paranasal sinus disease, most notably left ethmoid and sphenoid sinusitis. Electronically Signed   By: Jackey Loge DO   On: 12/10/2020 19:26   CT ANGIO NECK W OR WO CONTRAST  Result Date: 12/11/2020 CLINICAL DATA:  Seizure and acute stroke EXAM: CT ANGIOGRAPHY HEAD AND NECK TECHNIQUE: Multidetector CT imaging of the head and neck was performed using the standard protocol during bolus administration of intravenous contrast. Multiplanar CT image reconstructions and MIPs were obtained to evaluate the vascular anatomy. Carotid stenosis measurements (when applicable) are obtained utilizing NASCET criteria, using the distal internal carotid diameter as the denominator. CONTRAST:  75mL OMNIPAQUE IOHEXOL 350 MG/ML SOLN COMPARISON:  Brain MRI from earlier today FINDINGS: CT HEAD FINDINGS Brain: Small acute infarcts by MRI are not perceptible separate from extensive chronic small vessel ischemia in the cerebral white matter. Chronic lacunar infarcts in the left pons and deep gray nuclei. Chronic cortical infarct in the right occipital lobe. No acute hemorrhage, hydrocephalus, or masslike finding. Cerebral volume loss. Vascular: See below Skull: Negative Sinuses: Negative Orbits: Negative Review of the MIP images confirms the above findings CTA NECK FINDINGS Aortic arch: Atheromatous plaque.  4 cm diameter aortic arch. Right carotid system: Diffuse atheromatous wall thickening of the common carotid with mixed density plaque at the bifurcation. Predominately low-density plaque at the bulb creates a short segment string sign with patent channel only measuring 1 mm, at least 75% stenosis. No superimposed dissection or ulceration. Left carotid system: Atheromatous wall thickening of the common carotid with mixed density plaque at the bifurcation. No flow reducing stenosis, ulceration, or dissection. Vertebral arteries: Proximal subclavian atherosclerosis without ulceration or dissection. High-grade narrowing of the dominant left vertebral origin. Occluded right V1 segment with reconstitution at the distal V1 level and thready flow  and less luminal density by the dura. The right vertebral artery gradually increases in size. Skeleton: Spondylosis and degenerative disc narrowing. Posterior longitudinal ligament ossification at C4 to C6,  likely contacting the ventral cord at C5. No acute finding. Other neck: Negative Upper chest: Biapical fibrosis. Review of the MIP images confirms the above findings CTA HEAD FINDINGS Anterior circulation: Carotid siphon heavy atheromatous calcification with prominent calcified plaque at the supraclinoid right ICA causing 80% stenosis by measurement. Atheromatous irregularity of MCA vessels diffusely. No proximal branch occlusion, beading, or aneurysm. Posterior circulation: Less intense flow in the right vertebral artery in the setting of proximal occlusion and reconstitution. Atheromatous irregularity of the left V4 segment with mild to moderate distal narrowing. Diffusely patent basilar. PCA branches are atheromatous especially at the right P2 segment. Venous sinuses: Unremarkable in the arterial phase Anatomic variants: None significant Review of the MIP images confirms the above findings IMPRESSION: 1. Severe atheromatous stenosis at the right ICA bulb with short segment string sign. Second flow reducing stenosis in the supraclinoid right ICA. 2. 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 The right vertebral artery gradually reconstitutes but is less intensely enhancing by the level of the basilar. 3. 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 4. No intracranial hemorrhage. Known acute infarcts are not visible separate from the patient's extensive chronic ischemic injury. Electronically Signed   By: Marnee Spring M.D.   On: 12/11/2020 08:16   MR Brain Wo Contrast (neuro protocol)  Result Date: 12/11/2020 CLINICAL DATA:  Delirium and left-sided weakness EXAM: MRI HEAD WITHOUT CONTRAST TECHNIQUE: Multiplanar, multiecho pulse sequences of the brain and surrounding  structures were obtained without intravenous contrast. COMPARISON:  Head CT from yesterday FINDINGS: Brain: Tiny acute infarcts scattered along the corona radiata and cortex of the right frontal to occipital lobes. Small acute infarct at the genu of the right internal capsule. There is a background of advanced chronic small vessel disease with extensive gliosis and chronic lacune is in the left pons and bilateral deep gray nuclei. Remote cortical infarct in the right occipital lobe. Small remote bilateral cerebellar infarctions. No mass, acute hemorrhage, obstructive hydrocephalus, or extra-axial collection. Vascular: Absent flow void in the right V4 segment just beyond the dura, but normalized more distally and potentially affected by motion artifact on this slice. Skull and upper cervical spine: Normal marrow signal Sinuses/Orbits: Unremarkable Other: Intermittent significant motion degradation. IMPRESSION: 1. Small acute infarcts scattered along the deep watershed territory of the right cerebral hemisphere. 2. Background of advanced chronic ischemic injury. 3. Motion degraded Electronically Signed   By: Marnee Spring M.D.   On: 12/11/2020 04:24   DG Abdomen Acute W/Chest  Result Date: 12/10/2020 CLINICAL DATA:  Check for metal.  MRI clearance. EXAM: DG ABDOMEN ACUTE WITH 1 VIEW CHEST COMPARISON:  X-ray abdomen 05/25/2015, chest x-ray 12/06/2017 FINDINGS: There is no evidence of dilated bowel loops or free intraperitoneal air. No radiopaque calculi or other significant radiographic abnormality is seen. The heart size and mediastinal contours are unchanged. Aortic arch calcifications. Biapical pleural/pulmonary scarring. Nipple shadow overlies the left lower lobe. Similar appearing calcified granuloma at the right apex. No focal consolidation. No pulmonary edema. No pleural effusion. No pneumothorax. No acute osseous abnormality. Multilevel degenerative changes of the spine. No unexpected metallic density  identified.  Overlying cardiac leads. IMPRESSION: Negative abdominal radiographs.  No acute cardiopulmonary disease. Electronically Signed   By: Tish Frederickson M.D.   On: 12/10/2020 23:59   ECHOCARDIOGRAM COMPLETE  Result Date: 12/11/2020    ECHOCARDIOGRAM REPORT   Patient Name:   ROREY BISSON Date of Exam: 12/11/2020 Medical Rec #:  [id number]  Height:       72.0 in Accession #:    [id number]     Weight:       207.2 lb Date of Birth:  09/23/1951       BSA:          2.163 m Patient Age:    69 years       BP:           146/85 mmHg Patient Gender: M              HR:           55 bpm. Exam Location:  Inpatient Procedure: 2D Echo, Cardiac Doppler and Color Doppler Indications:    Stroke 434.91 / I163.9  History:        Patient has no prior history of Echocardiogram examinations.                 Risk Factors:Hypertension and Diabetes.  Sonographer:    Tiffany Dance Referring Phys: [id number] RONDELL A SMITH IMPRESSIONS  1. Left ventricular ejection fraction, by estimation, is 60 to 65%. The left ventricle has normal function. The left ventricle has no regional wall motion abnormalities. There is moderate concentric left ventricular hypertrophy. Left ventricular diastolic parameters are consistent with Grade I diastolic dysfunction (impaired relaxation). Elevated left ventricular end-diastolic pressure.  2. Right ventricular systolic function is normal. The right ventricular size is normal.  3. Left atrial size was mildly dilated.  4. The mitral valve is normal in structure. No evidence of mitral valve regurgitation. No evidence of mitral stenosis.  5. The aortic valve is calcified. There is moderate calcification of the aortic valve. There is moderate thickening of the aortic valve. Aortic valve regurgitation is not visualized. No aortic stenosis is present.  6. Aortic dilatation noted. There is mild dilatation at the level of the sinuses of Valsalva, measuring 43 mm. There is mild dilatation of the ascending aorta,  measuring 41 mm.  7. The inferior vena cava is normal in size with greater than 50% respiratory variability, suggesting right atrial pressure of 3 mmHg. FINDINGS  Left Ventricle: Left ventricular ejection fraction, by estimation, is 60 to 65%. The left ventricle has normal function. The left ventricle has no regional wall motion abnormalities. The left ventricular internal cavity size was normal in size. There is  moderate concentric left ventricular hypertrophy. Left ventricular diastolic parameters are consistent with Grade I diastolic dysfunction (impaired relaxation). Elevated left ventricular end-diastolic pressure. Right Ventricle: The right ventricular size is normal. No increase in right ventricular wall thickness. Right ventricular systolic function is normal. Left Atrium: Left atrial size was mildly dilated. Right Atrium: Right atrial size was normal in size. Pericardium: There is no evidence of pericardial effusion. Mitral Valve: The mitral valve is normal in structure. No evidence of mitral valve regurgitation. No evidence of mitral valve stenosis. Tricuspid Valve: The tricuspid valve is normal in structure. Tricuspid valve regurgitation is not demonstrated. No evidence of tricuspid stenosis. Aortic Valve: The aortic valve is calcified. There is moderate calcification of the aortic valve. There is moderate thickening of the aortic valve. Aortic valve regurgitation is not visualized. No aortic stenosis is present. Pulmonic Valve: The pulmonic valve was normal in structure. Pulmonic valve regurgitation is not visualized. No evidence of pulmonic stenosis. Aorta: Aortic dilatation noted. There is mild dilatation at the level of the sinuses of Valsalva, measuring 43 mm. There is mild dilatation of the ascending aorta, measuring 41 mm. Venous: The inferior vena  cava is normal in size with greater than 50% respiratory variability, suggesting right atrial pressure of 3 mmHg. IAS/Shunts: No atrial level shunt  detected by color flow Doppler.  LEFT VENTRICLE PLAX 2D LVIDd:         4.80 cm  Diastology LVIDs:         2.90 cm  LV e' medial:    2.72 cm/s LV PW:         1.40 cm  LV E/e' medial:  19.0 LV IVS:        1.40 cm  LV e' lateral:   4.35 cm/s LVOT diam:     2.30 cm  LV E/e' lateral: 11.9 LV SV:         87 LV SV Index:   40 LVOT Area:     4.15 cm  RIGHT VENTRICLE            IVC RV Basal diam:  2.10 cm    IVC diam: 1.90 cm RV S prime:     8.70 cm/s TAPSE (M-mode): 2.5 cm LEFT ATRIUM             Index       RIGHT ATRIUM           Index LA diam:        3.40 cm 1.57 cm/m  RA Area:     14.80 cm LA Vol (A2C):   64.9 ml 30.01 ml/m RA Volume:   39.20 ml  18.12 ml/m LA Vol (A4C):   41.2 ml 19.05 ml/m LA Biplane Vol: 54.9 ml 25.38 ml/m  AORTIC VALVE LVOT Vmax:   101.00 cm/s LVOT Vmean:  73.300 cm/s LVOT VTI:    0.210 m  AORTA Ao Root diam: 4.30 cm Ao Asc diam:  4.10 cm MITRAL VALVE MV Area (PHT): 2.09 cm    SHUNTS MV Decel Time: 363 msec    Systemic VTI:  0.21 m MV E velocity: 51.80 cm/s  Systemic Diam: 2.30 cm MV A velocity: 72.80 cm/s MV E/A ratio:  0.71 Chilton Si MD Electronically signed by Chilton Si MD Signature Date/Time: 12/11/2020/3:43:17 PM    Final      Labs:   Basic Metabolic Panel: Recent Labs  Lab 12/14/20 0812 12/15/20 0324 12/16/20 0102 12/18/20 0112  NA 137 135 138 134*  K 3.8 4.0 4.0 4.5  CL 102 103 102 101  CO2 25 25 26 24   GLUCOSE 88 85 108* 92  BUN 16 15 12 16   CREATININE 1.11 1.03 1.00 0.91  CALCIUM 8.9 8.6* 8.7* 8.7*   GFR Estimated Creatinine Clearance: 88.7 mL/min (by C-G formula based on SCr of 0.91 mg/dL). Liver Function Tests: No results for input(s): AST, ALT, ALKPHOS, BILITOT, PROT, ALBUMIN in the last 168 hours. No results for input(s): LIPASE, AMYLASE in the last 168 hours. No results for input(s): AMMONIA in the last 168 hours. Coagulation profile No results for input(s): INR, PROTIME in the last 168 hours.  CBC: Recent Labs  Lab 12/14/20 0812  12/16/20 0102  WBC 3.6* 5.1  HGB 14.4 14.7  HCT 42.3 41.9  MCV 96.1 96.5  PLT 189 188   Cardiac Enzymes: No results for input(s): CKTOTAL, CKMB, CKMBINDEX, TROPONINI in the last 168 hours. BNP: Invalid input(s): POCBNP CBG: Recent Labs  Lab 12/14/20 0823 12/14/20 1131 12/14/20 1610 12/15/20 1117 12/15/20 1610  GLUCAP 97 194* 133* 140* 136*   D-Dimer No results for input(s): DDIMER in the last 72 hours. Hgb A1c No results for  input(s): HGBA1C in the last 72 hours. Lipid Profile No results for input(s): CHOL, HDL, LDLCALC, TRIG, CHOLHDL, LDLDIRECT in the last 72 hours. Thyroid function studies No results for input(s): TSH, T4TOTAL, T3FREE, THYROIDAB in the last 72 hours.  Invalid input(s): FREET3 Anemia work up No results for input(s): VITAMINB12, FOLATE, FERRITIN, TIBC, IRON, RETICCTPCT in the last 72 hours. Microbiology Recent Results (from the past 240 hour(s))  Resp Panel by RT-PCR (Flu A&B, Covid) Nasopharyngeal Swab     Status: None   Collection Time: 12/11/20 12:04 AM   Specimen: Nasopharyngeal Swab; Nasopharyngeal(NP) swabs in vial transport medium  Result Value Ref Range Status   SARS Coronavirus 2 by RT PCR NEGATIVE NEGATIVE Final    Comment: (NOTE) SARS-CoV-2 target nucleic acids are NOT DETECTED.  The SARS-CoV-2 RNA is generally detectable in upper respiratory specimens during the acute phase of infection. The lowest concentration of SARS-CoV-2 viral copies this assay can detect is 138 copies/mL. A negative result does not preclude SARS-Cov-2 infection and should not be used as the sole basis for treatment or other patient management decisions. A negative result may occur with  improper specimen collection/handling, submission of specimen other than nasopharyngeal swab, presence of viral mutation(s) within the areas targeted by this assay, and inadequate number of viral copies(<138 copies/mL). A negative result must be combined with clinical  observations, patient history, and epidemiological information. The expected result is Negative.  Fact Sheet for Patients:  BloggerCourse.com  Fact Sheet for Healthcare Providers:  SeriousBroker.it  This test is no t yet approved or cleared by the [place] FDA and  has been authorized for detection and/or diagnosis of SARS-CoV-2 by FDA under an Emergency Use Authorization (EUA). This EUA will remain  in effect (meaning this test can be used) for the duration of the COVID-19 declaration under Section 564(b)(1) of the Act, 21 U.S.C.section 360bbb-3(b)(1), unless the authorization is terminated  or revoked sooner.       Influenza A by PCR NEGATIVE NEGATIVE Final   Influenza B by PCR NEGATIVE NEGATIVE Final    Comment: (NOTE) The Xpert Xpress SARS-CoV-2/FLU/RSV plus assay is intended as an aid in the diagnosis of influenza from Nasopharyngeal swab specimens and should not be used as a sole basis for treatment. Nasal washings and aspirates are unacceptable for Xpert Xpress SARS-CoV-2/FLU/RSV testing.  Fact Sheet for Patients: BloggerCourse.com  Fact Sheet for Healthcare Providers: SeriousBroker.it  This test is not yet approved or cleared by the [place] FDA and has been authorized for detection and/or diagnosis of SARS-CoV-2 by FDA under an Emergency Use Authorization (EUA). This EUA will remain in effect (meaning this test can be used) for the duration of the COVID-19 declaration under Section 564(b)(1) of the Act, 21 U.S.C. section 360bbb-3(b)(1), unless the authorization is terminated or revoked.  Performed at [facility], [address] [place], [place] [address]   SARS Coronavirus 2 by RT PCR (hospital order, performed in [facility] hospital lab)     Status: None   Collection Time: 12/14/20  3:20 AM  Result Value Ref Range Status   SARS Coronavirus 2  NEGATIVE NEGATIVE Final    Comment: (NOTE) SARS-CoV-2 target nucleic acids are NOT DETECTED.  The SARS-CoV-2 RNA is generally detectable in upper and lower respiratory specimens during the acute phase of infection. The lowest concentration of SARS-CoV-2 viral copies this assay can detect is 250 copies / mL. A negative result does not preclude SARS-CoV-2 infection and should not be used as  the sole basis for treatment or other patient management decisions.  A negative result may occur with improper specimen collection / handling, submission of specimen other than nasopharyngeal swab, presence of viral mutation(s) within the areas targeted by this assay, and inadequate number of viral copies (<250 copies / mL). A negative result must be combined with clinical observations, patient history, and epidemiological information.  Fact Sheet for Patients:   BoilerBrush.com.cy  Fact Sheet for Healthcare Providers: https://pope.com/  This test is not yet approved or  cleared by the [place] FDA and has been authorized for detection and/or diagnosis of SARS-CoV-2 by FDA under an Emergency Use Authorization (EUA).  This EUA will remain in effect (meaning this test can be used) for the duration of the COVID-19 declaration under Section 564(b)(1) of the Act, 21 U.S.C. section 360bbb-3(b)(1), unless the authorization is terminated or revoked sooner.  Performed at [facility], [address], [place], [place] [address]   SARS CORONAVIRUS 2 (TAT 6-24 HRS) Nasopharyngeal Nasopharyngeal Swab     Status: None   Collection Time: 12/20/20  9:23 AM   Specimen: Nasopharyngeal Swab  Result Value Ref Range Status   SARS Coronavirus 2 NEGATIVE NEGATIVE Final    Comment: (NOTE) SARS-CoV-2 target nucleic acids are NOT DETECTED.  The SARS-CoV-2 RNA is generally detectable in upper and lower respiratory specimens during the acute phase of infection.  Negative results do not preclude SARS-CoV-2 infection, do not rule out co-infections with other pathogens, and should not be used as the sole basis for treatment or other patient management decisions. Negative results must be combined with clinical observations, patient history, and epidemiological information. The expected result is Negative.  Fact Sheet for Patients: HairSlick.no  Fact Sheet for Healthcare Providers: quierodirigir.com  This test is not yet approved or cleared by the [place] FDA and  has been authorized for detection and/or diagnosis of SARS-CoV-2 by FDA under an Emergency Use Authorization (EUA). This EUA will remain  in effect (meaning this test can be used) for the duration of the COVID-19 declaration under Se ction 564(b)(1) of the Act, 21 U.S.C. section 360bbb-3(b)(1), unless the authorization is terminated or revoked sooner.  Performed at [facility], [address] [place], [place] [address]      Discharge Instructions:   Discharge Instructions    Ambulatory referral to Neurology   Complete by: As directed    Follow up with stroke clinic NP (Jessica Vanschaick or Darrol Angel, if both not available, consider Manson Allan, or Ahern) at [facility] in about 4 weeks. Thanks.   Diet - low sodium heart healthy   Complete by: As directed    Diet Carb Modified   Complete by: As directed    Discharge instructions   Complete by: As directed    Follow-up with your primary care provider at the skilled nursing facility in 3 to 5 days.  Follow-up with [facility] as scheduled by the clinic.  Continue to take medications as prescribed.   Increase activity slowly   Complete by: As directed    No wound care   Complete by: As directed      Allergies as of 12/20/2020   No Known Allergies     Medication List    TAKE these medications   amLODipine 10 MG tablet Commonly known  as: NORVASC Take 10 mg by mouth daily with lunch.   aspirin EC 81 MG tablet Take 1 tablet (81 mg total) by mouth daily.  Swallow whole.   atorvastatin 80 MG tablet Commonly known as: LIPITOR Take 1 tablet (80 mg total) by mouth at bedtime. What changed:   medication strength  how much to take   clopidogrel 75 MG tablet Commonly known as: PLAVIX Take 1 tablet (75 mg total) by mouth daily.   donepezil 5 MG tablet Commonly known as: ARICEPT Take 5 mg by mouth daily with lunch.   hydrALAZINE 25 MG tablet Commonly known as: APRESOLINE Take 25 mg by mouth at bedtime.   labetalol 100 MG tablet Commonly known as: NORMODYNE Take 0.5 tablets (50 mg total) by mouth 2 (two) times daily. What changed:   how much to take  when to take this   lisinopril 20 MG tablet Commonly known as: ZESTRIL Take 20 mg by mouth daily with lunch.   mirabegron ER 50 MG Tb24 tablet Commonly known as: MYRBETRIQ Take 50 mg by mouth daily with lunch.   mirtazapine 7.5 MG tablet Commonly known as: REMERON Take 7.5 mg by mouth daily with lunch.   nicotine 14 mg/24hr patch Commonly known as: NICODERM CQ - dosed in mg/24 hours Place 1 patch (14 mg total) onto the skin daily. Start taking on: December 21, 2020   phenytoin 200 MG ER capsule Commonly known as: DILANTIN Take 2 capsules (400 mg total) by mouth daily.   sertraline 100 MG tablet Commonly known as: ZOLOFT Take 100 mg by mouth daily with lunch.   tamsulosin 0.4 MG Caps capsule Commonly known as: FLOMAX Take 0.4 mg by mouth daily with lunch.       Contact information for follow-up providers    [facility]. Schedule an appointment as soon as possible for a visit in 4 week(s).   Specialty: Neurology Contact information: [address] [place] [id number] [phone]       Cephus Shellinglark, Christopher J, MD Follow up in 4 week(s).   Specialty: Vascular Surgery Why: the office will call patient  with follow up appointment Contact information: [address] [place] [id number] [phone]            Contact information for after-discharge care    Destination    [facility] Preferred SNF .   Service: Skilled Nursing Contact information: [address] [place] [id number] [phone]                   Time coordinating discharge: 39 minutes  Signed:  Tahjae Clausing  [facility] 12/20/2020, 3:09 PM

## 2020-12-20 NOTE — TOC Progression Note (Signed)
Transition of Care [facility]) - Progression Note    Patient Details  Name: Philip Robinson MRN: [id number] Date of Birth: 01-Feb-1951  Transition of Care [facility]) CM/SW Contact  Eduard Roux, [place] Phone Number: 12/20/2020, 1:56 PM  Clinical Narrative:     CSW spoke with patient's daughter,Pamela- informed of bed offers. She chose [facility]. CSW contacted [facility] , they confirmed bed offer.   covid test requested- dc/ pending on when test results are back.  CSW will continue to follow and assist with discharge planning.  Antony Blackbird, MSW, LCSW Clinical Social Worker   Expected Discharge Plan: Skilled Nursing Facility Barriers to Discharge: SNF Pending bed offer,Awaiting State Approval (PASRR)  Expected Discharge Plan and Services Expected Discharge Plan: Skilled Nursing Facility       Living arrangements for the past 2 months: Single Family Home                                       Social Determinants of Health (SDOH) Interventions    Readmission Risk Interventions No flowsheet data found.

## 2020-12-20 NOTE — Progress Notes (Signed)
Physical Therapy Treatment Patient Details Name: Philip Robinson MRN: [id number] DOB: 1951/05/27 Today's Date: 12/20/2020    History of Present Illness Pt is a 70 y.o. male with a medical hx significant for seizures, HTN, dementia, stroke anuerysm, and DM2 who presents with generalized weakness especially on the L side, increased confusion, and difficulty standing since d/c 12/06/20 from recent hospitalization 2/2 seizures. MRI revealed small acute infarcts of the watershed area of the R cerebral hemisphere. CTA showed R ICA stenosis. s/p 12/15/20 s/p right TCAR    PT Comments    Patient received in bed sleeping. Difficult to wake. Patient is agreeable to PT session once awake. He requires min assist with bed mobility and mod assist +2 for transfers. Patient with initial unsteadiness leaning posteriorly with narrow BOS. Patient requires constant cues for safety and mobility to continue moving forward. Close chair to follow during ambulation.  Patient will continue to benefit from skilled PT while here to improve functional independence and safety with mobility.     Follow Up Recommendations  SNF     Equipment Recommendations  3in1 (PT);Rolling walker with 5" wheels    Recommendations for Other Services       Precautions / Restrictions Precautions Precautions: Fall Precaution Comments: seizures; dementia Restrictions Weight Bearing Restrictions: No    Mobility  Bed Mobility Overal bed mobility: Needs Assistance Bed Mobility: Supine to Sit     Supine to sit: Min assist     General bed mobility comments: requires assist to bring LEs off bed and around to floor and to raise trunk  Transfers Overall transfer level: Needs assistance Equipment used: Rolling walker (2 wheeled) Transfers: Sit to/from Stand Sit to Stand: From elevated surface;Min assist;+2 physical assistance         General transfer comment: min/mod assist for sit to stand   with +2 assist for  safety.  Ambulation/Gait Ambulation/Gait assistance: Mod assist;+2 safety/equipment Gait Distance (Feet): 12 Feet Assistive device: Rolling walker (2 wheeled) Gait Pattern/deviations: Step-to pattern;Decreased step length - right;Decreased step length - left;Shuffle;Narrow base of support Gait velocity: decr   General Gait Details: mild posterior lean, very narrow bos which he is unable to correct despite cues. Difficulty maintaining forward advancement without assistance.   Stairs             Wheelchair Mobility    Modified Rankin (Stroke Patients Only) Modified Rankin (Stroke Patients Only) Pre-Morbid Rankin Score: Moderate disability Modified Rankin: Moderately severe disability     Balance Overall balance assessment: Needs assistance Sitting-balance support: Feet supported;Single extremity supported Sitting balance-Leahy Scale: Fair Sitting balance - Comments: pt able to sit without support of hands Postural control: Posterior lean Standing balance support: Bilateral upper extremity supported;During functional activity Standing balance-Leahy Scale: Poor Standing balance comment: reliant on RW and mod assist with ambulation                            Cognition Arousal/Alertness: Lethargic Behavior During Therapy: Flat affect Overall Cognitive Status: No family/caregiver present to determine baseline cognitive functioning Area of Impairment: Awareness;Attention;Problem solving;Following commands;Safety/judgement                 Orientation Level: Disoriented to;Place;Time;Situation Current Attention Level: Sustained Memory: Decreased recall of precautions;Decreased short-term memory Following Commands: Follows one step commands with increased time;Follows one step commands inconsistently Safety/Judgement: Decreased awareness of safety;Decreased awareness of deficits Awareness: Intellectual Problem Solving: Slow processing;Decreased  initiation;Difficulty sequencing;Requires verbal cues;Requires tactile cues General  Comments: very slow with mobility, lethargic. Requires multimodal cues for mobility this session.      Exercises      General Comments        Pertinent Vitals/Pain Pain Assessment: Faces Faces Pain Scale: Hurts little more Pain Location: "all over" Pain Intervention(s): Monitored during session    Home Living                      Prior Function            PT Goals (current goals can now be found in the care plan section) Acute Rehab PT Goals Patient Stated Goal: none stated this session PT Goal Formulation: With patient Time For Goal Achievement: 12/26/20 Potential to Achieve Goals: Fair Progress towards PT goals: Progressing toward goals    Frequency    Min 2X/week      PT Plan Current plan remains appropriate    Co-evaluation PT/OT/SLP Co-Evaluation/Treatment: Yes Reason for Co-Treatment: For patient/therapist safety;To address functional/ADL transfers PT goals addressed during session: Mobility/safety with mobility;Balance        AM-PAC PT "6 Clicks" Mobility   Outcome Measure  Help needed turning from your back to your side while in a flat bed without using bedrails?: A Little Help needed moving from lying on your back to sitting on the side of a flat bed without using bedrails?: A Little Help needed moving to and from a bed to a chair (including a wheelchair)?: A Lot Help needed standing up from a chair using your arms (e.g., wheelchair or bedside chair)?: A Lot Help needed to walk in hospital room?: A Lot Help needed climbing 3-5 steps with a railing? : Total 6 Click Score: 13    End of Session Equipment Utilized During Treatment: Gait belt Activity Tolerance: Patient limited by lethargy;Patient limited by fatigue Patient left: in chair;with call bell/phone within reach;with chair alarm set Nurse Communication: Mobility status PT Visit Diagnosis:  Unsteadiness on feet (R26.81);Other abnormalities of gait and mobility (R26.89);Muscle weakness (generalized) (M62.81);Difficulty in walking, not elsewhere classified (R26.2);Ataxic gait (R26.0)     Time: 1130-1144 PT Time Calculation (min) (ACUTE ONLY): 14 min  Charges:  $Gait Training: 8-22 mins                     Josias Tomerlin, PT, GCS 12/20/20,12:17 PM

## 2020-12-20 NOTE — TOC Transition Note (Addendum)
Transition of Care [facility]) - CM/SW Discharge Note   Patient Details  Name: Philip Robinson MRN: [id number] Date of Birth: 1951-02-27  [facility] Care [facility]) CM/SW Contact:  Eduard Roux, LCSWA Phone Number: 12/20/2020, 3:37 PM   Clinical Narrative:     Patient will DC to: [facility]  DC Date: 12/20/20 Family Notified: Pamela,daughter Transport By: Sharin Mons   Per MD patient is ready for discharge. RN, patient, and facility notified of DC. Discharge Summary sent to facility. RN given number for [phone], Room 102. Ambulance transport requested for patient.   Clinical Social Worker signing off.  Antony Blackbird, MSW, LCSW Clinical Social Worker    Final next level of care: Skilled Nursing Facility Barriers to Discharge: Barriers Resolved   Patient Goals and CMS Choice   CMS Medicare.gov Compare Post Acute Care list provided to:: Other (Comment Required) (Daughter) Choice offered to / list presented to : Adult Children  Discharge Placement              Patient [facility] bed at: [facility] Patient to be transferred to facility by: [facility] Name of family member notified: daughter,Pamela Patient and family notified of of transfer: 12/20/20  Discharge Plan and Services                                     Social Determinants of Health (SDOH) Interventions     Readmission Risk Interventions No flowsheet data found.

## 2020-12-20 NOTE — TOC Progression Note (Signed)
Transition of Care [facility]) - Progression Note    Patient Details  Name: Philip Robinson MRN: [id number] Date of Birth: July 20, 1951  Transition [facility] Care [facility]) CM/SW Contact  Eduard Roux, [place] Phone Number: 12/20/2020, 9:09 AM  Clinical Narrative:     CSW spoke with patient's daughter,Pamela. CSW provided bed offers-she chose [facility].   CSW contacted [facility] to confirm bed offer- CSW waiting on response  Patient needs covid test  CSW will continue to follow and assist with discharge planning.  Antony Blackbird, MSW, LCSW Clinical Social Worker   Expected Discharge Plan: Skilled Nursing Facility Barriers to Discharge: SNF Pending bed offer,Awaiting State Approval (PASRR)  Expected Discharge Plan and Services Expected Discharge Plan: Skilled Nursing Facility       Living arrangements for the past 2 months: Single Family Home                                       Social Determinants of Health (SDOH) Interventions    Readmission Risk Interventions No flowsheet data found.

## 2020-12-20 NOTE — TOC Progression Note (Signed)
Transition of Care [facility]) - Progression Note    Patient Details  Name: Philip Robinson MRN: [id number] Date of Birth: 1951/03/19  Transition of Care [facility]) CM/SW Contact  Eduard Roux, [place] Phone Number: 12/20/2020, 3:47 PM  Clinical Narrative:     Faxed clinicals to [facility]   Antony Blackbird, MSW, LCSW Clinical Social Worker   Expected Discharge Plan: Skilled Nursing Facility Barriers to Discharge: Barriers Resolved  Expected Discharge Plan and Services Expected Discharge Plan: Skilled Nursing Facility       Living arrangements for the past 2 months: Single Family Home Expected Discharge Date: 12/20/20                                     Social Determinants of Health (SDOH) Interventions    Readmission Risk Interventions No flowsheet data found.

## 2020-12-20 NOTE — Progress Notes (Signed)
Occupational Therapy Treatment Patient Details Name: Philip Robinson MRN: [id number] DOB: 1951/06/17 Today's Date: 12/20/2020    History of present illness Pt is a 70 y.o. male with a medical hx significant for seizures, HTN, dementia, stroke anuerysm, and DM2 who presents with generalized weakness especially on the L side, increased confusion, and difficulty standing since d/c 12/06/20 from recent hospitalization 2/2 seizures. MRI revealed small acute infarcts of the watershed area of the R cerebral hemisphere. CTA showed R ICA stenosis. s/p 12/15/20 s/p right TCAR   OT comments  Pt seen in conjunction with PT to maximize pts activity tolerance.  Pt continues to present with baseline cognitive deficits, decreased activity tolerance and generalized weakness. Pt able to ambulate ~ 12 ft with MOD A +2 with RW. Pt presents with very narrow base of support during ambulation needing tactile cues to initiate widening BOS. Pt more lethargic and flat this session needing increased time to follow commands. Pt currently requires supervision- set- up assist for seated UB ADLs and min guard for LB ADLs from EOB. Noted CIR signed off as pts daughter prefers pt go to SNF, updated DC recs to reflect change in POC, will let OTR know. Will continue to follow acutely per POC.   Follow Up Recommendations  Supervision/Assistance - 24 hour;SNF    Equipment Recommendations  Other (comment) (defer to next venue of care)    Recommendations for Other Services      Precautions / Restrictions Precautions Precautions: Fall Precaution Comments: seizures; dementia Restrictions Weight Bearing Restrictions: No       Mobility Bed Mobility Overal bed mobility: Needs Assistance Bed Mobility: Supine to Sit     Supine to sit: Min assist     General bed mobility comments: requires assist to bring LEs off bed and around to floor and to raise trunk  Transfers Overall transfer level: Needs assistance Equipment used:  Rolling walker (2 wheeled) Transfers: Sit to/from Stand Sit to Stand: From elevated surface;Min assist;+2 physical assistance;Mod assist         General transfer comment: min/mod assist for sit to stand   with +2 assist for safety.    Balance Overall balance assessment: Needs assistance Sitting-balance support: Feet supported;Single extremity supported Sitting balance-Leahy Scale: Fair Sitting balance - Comments: pt able to sit without support of hands Postural control: Posterior lean Standing balance support: Bilateral upper extremity supported;During functional activity Standing balance-Leahy Scale: Poor Standing balance comment: reliant on RW and mod assist with ambulation                           ADL either performed or assessed with clinical judgement   ADL Overall ADL's : Needs assistance/impaired     Grooming: Wash/dry face;Sitting;Supervision/safety;Bed level;Set up               Lower Body Dressing: Min guard;Sitting/lateral leans Lower Body Dressing Details (indicate cue type and reason): to adjust socks from EOB with min guard from EOB Toilet Transfer: Minimal assistance;RW;Ambulation Toilet Transfer Details (indicate cue type and reason): simulated via functional mobility with RW         Functional mobility during ADLs: Minimal assistance;Rolling walker;Cueing for safety;Cueing for sequencing General ADL Comments: pt continues to present with baseline cognitive deficits, decreased activity tolerance and generalized weakness     Vision       Perception     Praxis      Cognition Arousal/Alertness: Lethargic;Awake/alert (more awake at end of session)  Behavior During Therapy: Flat affect Overall Cognitive Status: History of cognitive impairments - at baseline Area of Impairment: Awareness;Attention;Problem solving;Following commands;Safety/judgement                 Orientation Level: Disoriented to;Place;Time;Situation Current  Attention Level: Sustained Memory: Decreased recall of precautions;Decreased short-term memory Following Commands: Follows one step commands with increased time;Follows one step commands inconsistently Safety/Judgement: Decreased awareness of safety;Decreased awareness of deficits Awareness: Intellectual Problem Solving: Slow processing;Decreased initiation;Difficulty sequencing;Requires verbal cues;Requires tactile cues General Comments: very slow with mobility, lethargic. Requires multimodal cues for mobility this session.        Exercises     Shoulder Instructions       General Comments      Pertinent Vitals/ Pain       Pain Assessment: Faces Faces Pain Scale: Hurts little more Pain Location: "all over" Pain Descriptors / Indicators: Discomfort Pain Intervention(s): Monitored during session  Home Living                                          Prior Functioning/Environment              Frequency  Min 2X/week        Progress Toward Goals  OT Goals(current goals can now be found in the care plan section)  Progress towards OT goals: Progressing toward goals  Acute Rehab OT Goals Patient Stated Goal: none stated this session OT Goal Formulation: With patient Time For Goal Achievement: 12/27/20 Potential to Achieve Goals: Good  Plan Discharge plan remains appropriate;Frequency remains appropriate    Co-evaluation      Reason for Co-Treatment: For patient/therapist safety;To address functional/ADL transfers;Necessary to address cognition/behavior during functional activity PT goals addressed during session: Mobility/safety with mobility;Balance        AM-PAC OT "6 Clicks" Daily Activity     Outcome Measure   Help from another person eating meals?: None Help from another person taking care of personal grooming?: A Little Help from another person toileting, which includes using toliet, bedpan, or urinal?: A Lot Help from another person  bathing (including washing, rinsing, drying)?: A Lot Help from another person to put on and taking off regular upper body clothing?: A Little Help from another person to put on and taking off regular lower body clothing?: A Little 6 Click Score: 17    End of Session Equipment Utilized During Treatment: Gait belt;Rolling walker  OT Visit Diagnosis: Unsteadiness on feet (R26.81);Other abnormalities of gait and mobility (R26.89);Muscle weakness (generalized) (M62.81)   Activity Tolerance Patient tolerated treatment well   Patient Left in chair;with call bell/phone within reach;with chair alarm set   Nurse Communication Mobility status        Time: 1130-1146 OT Time Calculation (min): 16 min  Charges: OT General Charges $OT Visit: 1 Visit  Lenor Derrick., COTA/L Acute Rehabilitation Services [phone]    Barron Schmid 12/20/2020, 1:21 PM

## 2020-12-20 NOTE — Progress Notes (Signed)
PROGRESS NOTE  Philip Robinson BTD:[id number] DOB: [birth year] DOA: 12/10/2020 PCP: Hermelinda Dellen, NP   LOS: 9 days   Brief narrative: 70 year old male with medical history of diabetes mellitus type 2, hypertension, AAA, seizure disorder, vascular dementia was admitted to hospital on 12/10/2020 with left lower extremity weakness and altered mental status.  Work-up showed acute acute infarct of the watershed area of the right cerebral hemisphere.  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  Patient underwent right ICA stenting via TCAR on 12/15/2020.  After intervention, patient has been considered for rehabilitation.  Assessment/Plan:  Principal Problem:   Acute CVA (cerebrovascular accident) [facility]) Active Problems:   Seizure (HCC)   Essential hypertension   Ascending aortic aneurysm (HCC)   Vascular dementia (HCC)   Nicotine abuse   Diabetes mellitus without complication (HCC)   Overweight (BMI 25.0-29.9)   Acute CVA-patient had right-sided watershed infarcts secondary to sided secondary to right ICA stenosis, status post right ICA stenting via TCAR on 12/15/2020.  Continue aspirin and Plavix.  Continue atorvastatin.    2 D echocardiogram showed no thrombus.  Will need to follow-up with neurology as outpatient.    seizure disorder-currently on Dilantin.  Dose was modified recently.  Last seizure was 12/06/2020    Essential hypertension- On  Labetalol, Norvasc, Lisinopril.  Ascending aortic aneurysm-4 cm per last imaging.    Will need outpatient follow-up.  Dementia-continue Aricept,  Diabetes mellitus type 2-last hemoglobin A1c 5.3.    Continue diabetic diet.   DVT prophylaxis: SCD's Start: 12/15/20 1605 enoxaparin (LOVENOX) injection 40 mg Start: 12/11/20 0830   Code Status: Full code  Family Communication: none  Status is: Inpatient  Remains inpatient appropriate because:IV treatments appropriate due to intensity of illness or inability to take PO and Awaiting  nursing facility placement   Dispo: The patient is from: Home              Anticipated d/c is to: SNF              Anticipated d/c date is: 2 days              Patient currently is medically stable to d/c.   Consultants:  Neurology  Vascular surgery  Procedures:  TCAR   Anti-infectives:  . None  Anti-infectives (From admission, onward)   Start     Dose/Rate Route Frequency Ordered Stop   12/15/20 1700  ceFAZolin (ANCEF) IVPB 2g/100 mL premix        2 g 200 mL/hr over 30 Minutes Intravenous Every 8 hours 12/15/20 1604 12/16/20 0039       Subjective: Today, patient was seen and examined at bedside.  Patient states that he is okay.  Denies any dizziness lightheadedness chest pain shortness of breath or weakness  Objective: Vitals:   12/19/20 1800 12/19/20 2100  BP: (!) 146/105 (!) 143/87  Pulse: 60 77  Resp: 15 18  Temp: 98 F (36.7 C) 98.4 F (36.9 C)  SpO2: 98% 100%   No intake or output data in the 24 hours ending 12/20/20 0749 Filed Weights   12/10/20 1830  Weight: 94 kg   Body mass index is 28.11 kg/m.   Physical Exam: GENERAL: Patient is alert awake and oriented. Not in obvious distress. HENT: No scleral pallor or icterus. Pupils equally reactive to light. Oral mucosa is moist NECK: is supple, no gross swelling noted. CHEST: Clear to auscultation. No crackles or wheezes.  Diminished breath sounds bilaterally. CVS: S1 and  S2 heard, no murmur. Regular rate and rhythm.  ABDOMEN: Soft, non-tender, bowel sounds are present. EXTREMITIES: No edema. CNS: Cranial nerves are intact. No focal motor deficits. SKIN: warm and dry without rashes.  Data Review: I have personally reviewed the following laboratory data and studies,  CBC: Recent Labs  Lab 12/14/20 0812 12/16/20 0102  WBC 3.6* 5.1  HGB 14.4 14.7  HCT 42.3 41.9  MCV 96.1 96.5  PLT 189 188   Basic Metabolic Panel: Recent Labs  Lab 12/14/20 0812 12/15/20 0324 12/16/20 0102  12/18/20 0112  NA 137 135 138 134*  K 3.8 4.0 4.0 4.5  CL 102 103 102 101  CO2 25 25 26 24   GLUCOSE 88 85 108* 92  BUN 16 15 12 16   CREATININE 1.11 1.03 1.00 0.91  CALCIUM 8.9 8.6* 8.7* 8.7*   Liver Function Tests: No results for input(s): AST, ALT, ALKPHOS, BILITOT, PROT, ALBUMIN in the last 168 hours. No results for input(s): LIPASE, AMYLASE in the last 168 hours. No results for input(s): AMMONIA in the last 168 hours. Cardiac Enzymes: No results for input(s): CKTOTAL, CKMB, CKMBINDEX, TROPONINI in the last 168 hours. BNP (last 3 results) Recent Labs    10/22/20 0058  BNP 101.4*    ProBNP (last 3 results) No results for input(s): PROBNP in the last 8760 hours.  CBG: Recent Labs  Lab 12/14/20 0823 12/14/20 1131 12/14/20 1610 12/15/20 1117 12/15/20 1610  GLUCAP 97 194* 133* 140* 136*   Recent Results (from the past 240 hour(s))  Resp Panel by RT-PCR (Flu A&B, Covid) Nasopharyngeal Swab     Status: None   Collection Time: 12/11/20 12:04 AM   Specimen: Nasopharyngeal Swab; Nasopharyngeal(NP) swabs in vial transport medium  Result Value Ref Range Status   SARS Coronavirus 2 by RT PCR NEGATIVE NEGATIVE Final    Comment: (NOTE) SARS-CoV-2 target nucleic acids are NOT DETECTED.  The SARS-CoV-2 RNA is generally detectable in upper respiratory specimens during the acute phase of infection. The lowest concentration of SARS-CoV-2 viral copies this assay can detect is 138 copies/mL. A negative result does not preclude SARS-Cov-2 infection and should not be used as the sole basis for treatment or other patient management decisions. A negative result may occur with  improper specimen collection/handling, submission of specimen other than nasopharyngeal swab, presence of viral mutation(s) within the areas targeted by this assay, and inadequate number of viral copies(<138 copies/mL). A negative result must be combined with clinical observations, patient history, and  epidemiological information. The expected result is Negative.  Fact Sheet for Patients:  02/12/21  Fact Sheet for Healthcare Providers:  02/08/21  This test is no t yet approved or cleared by the [employer] FDA and  has been authorized for detection and/or diagnosis of SARS-CoV-2 by FDA under an Emergency Use Authorization (EUA). This EUA will remain  in effect (meaning this test can be used) for the duration of the COVID-19 declaration under Section 564(b)(1) of the Act, 21 U.S.C.section 360bbb-3(b)(1), unless the authorization is terminated  or revoked sooner.       Influenza A by PCR NEGATIVE NEGATIVE Final   Influenza B by PCR NEGATIVE NEGATIVE Final    Comment: (NOTE) The Xpert Xpress SARS-CoV-2/FLU/RSV plus assay is intended as an aid in the diagnosis of influenza from Nasopharyngeal swab specimens and should not be used as a sole basis for treatment. Nasal washings and aspirates are unacceptable for Xpert Xpress SARS-CoV-2/FLU/RSV testing.  Fact Sheet for Patients: SeriousBroker.it  Fact Sheet  for Healthcare Providers: SeriousBroker.it  This test is not yet approved or cleared by the [place] and has been authorized for detection and/or diagnosis of SARS-CoV-2 by FDA under an Emergency Use Authorization (EUA). This EUA will remain in effect (meaning this test can be used) for the duration of the COVID-19 declaration under Section 564(b)(1) of the Act, 21 U.S.C. section 360bbb-3(b)(1), unless the authorization is terminated or revoked.  Performed at [facility], [address] [place], [place] [address]   SARS Coronavirus 2 by RT PCR (hospital order, performed in [facility] hospital lab)     Status: None   Collection Time: 12/14/20  3:20 AM  Result Value Ref Range Status   SARS Coronavirus 2 NEGATIVE NEGATIVE Final    Comment:  (NOTE) SARS-CoV-2 target nucleic acids are NOT DETECTED.  The SARS-CoV-2 RNA is generally detectable in upper and lower respiratory specimens during the acute phase of infection. The lowest concentration of SARS-CoV-2 viral copies this assay can detect is 250 copies / mL. A negative result does not preclude SARS-CoV-2 infection and should not be used as the sole basis for treatment or other patient management decisions.  A negative result may occur with improper specimen collection / handling, submission of specimen other than nasopharyngeal swab, presence of viral mutation(s) within the areas targeted by this assay, and inadequate number of viral copies (<250 copies / mL). A negative result must be combined with clinical observations, patient history, and epidemiological information.  Fact Sheet for Patients:   BoilerBrush.com.cy  Fact Sheet for Healthcare Providers: https://pope.com/  This test is not yet approved or  cleared by the [place] FDA and has been authorized for detection and/or diagnosis of SARS-CoV-2 by FDA under an Emergency Use Authorization (EUA).  This EUA will remain in effect (meaning this test can be used) for the duration of the COVID-19 declaration under Section 564(b)(1) of the Act, 21 U.S.C. section 360bbb-3(b)(1), unless the authorization is terminated or revoked sooner.  Performed at [facility], [address] [place], [place] [address]      Studies: No results found.    Joycelyn Das, MD  [facility] 12/20/2020  If 7PM-7AM, please contact night-coverage

## 2020-12-20 NOTE — Progress Notes (Signed)
Patient transfer via [facility] amb. service To [facility] with all belongings. No complaints voiced R.N. into give 10 pm meds

## 2020-12-20 NOTE — Progress Notes (Signed)
First attempt to call report to [facility], was transferred by Diplomatic Services operational officer but no answer.  Will retry upon [facility] arrival.

## 2020-12-20 NOTE — Progress Notes (Signed)
Report given to Lavonna Rua, RN at [facility].

## 2020-12-23 ENCOUNTER — Other Ambulatory Visit: Payer: Self-pay

## 2020-12-23 DIAGNOSIS — I639 Cerebral infarction, unspecified: Secondary | ICD-10-CM

## 2021-01-15 ENCOUNTER — Other Ambulatory Visit: Payer: Self-pay

## 2021-01-15 ENCOUNTER — Emergency Department (HOSPITAL_COMMUNITY)
Admission: EM | Admit: 2021-01-15 | Discharge: 2021-01-16 | Disposition: A | Discharge status: Home / Self Care | Payer: Medicare Other | Attending: Emergency Medicine | Admitting: Emergency Medicine

## 2021-01-15 ENCOUNTER — Emergency Department (HOSPITAL_COMMUNITY): Payer: Medicare Other

## 2021-01-15 DIAGNOSIS — Z79899 Other long term (current) drug therapy: Secondary | ICD-10-CM | POA: Diagnosis not present

## 2021-01-15 DIAGNOSIS — W06XXXA Fall from bed, initial encounter: Secondary | ICD-10-CM | POA: Insufficient documentation

## 2021-01-15 DIAGNOSIS — F17200 Nicotine dependence, unspecified, uncomplicated: Secondary | ICD-10-CM | POA: Insufficient documentation

## 2021-01-15 DIAGNOSIS — Z7982 Long term (current) use of aspirin: Secondary | ICD-10-CM | POA: Diagnosis not present

## 2021-01-15 DIAGNOSIS — S0990XA Unspecified injury of head, initial encounter: Secondary | ICD-10-CM | POA: Diagnosis present

## 2021-01-15 DIAGNOSIS — F0390 Unspecified dementia without behavioral disturbance: Secondary | ICD-10-CM | POA: Diagnosis not present

## 2021-01-15 DIAGNOSIS — S0100XA Unspecified open wound of scalp, initial encounter: Secondary | ICD-10-CM | POA: Diagnosis not present

## 2021-01-15 DIAGNOSIS — I10 Essential (primary) hypertension: Secondary | ICD-10-CM | POA: Insufficient documentation

## 2021-01-15 DIAGNOSIS — W19XXXA Unspecified fall, initial encounter: Secondary | ICD-10-CM

## 2021-01-15 DIAGNOSIS — E119 Type 2 diabetes mellitus without complications: Secondary | ICD-10-CM | POA: Insufficient documentation

## 2021-01-15 DIAGNOSIS — Z7902 Long term (current) use of antithrombotics/antiplatelets: Secondary | ICD-10-CM | POA: Insufficient documentation

## 2021-01-15 DIAGNOSIS — Z8673 Personal history of transient ischemic attack (TIA), and cerebral infarction without residual deficits: Secondary | ICD-10-CM | POA: Insufficient documentation

## 2021-01-15 NOTE — ED Provider Notes (Signed)
[facility]-EMERGENCY DEPT Provider Note   CSN: [id number] Arrival date & time: 01/15/21  2217     History Chief Complaint  Patient presents with  . Fall    Philip Robinson is a 71 y.o. male.  70 yo M with a chief complaint of a fall.  The patient states that he was trying to get out of bed to use the restroom when he lost his balance and fell and struck his head.  This happened yesterday.  Has a mild headache.  Denies other areas of injury.  Denies chest pain abdominal pain denies extremity pain.  Has otherwise been doing well.  The history is provided by the patient.  Fall This is a new problem. The current episode started yesterday. The problem occurs rarely. The problem has been resolved. Associated symptoms include headaches. Pertinent negatives include no chest pain, no abdominal pain and no shortness of breath. Nothing aggravates the symptoms. He has tried nothing for the symptoms. The treatment provided no relief.       Past Medical History:  Diagnosis Date  . Dementia (HCC)   . Diabetes mellitus without complication (HCC)   . Hypertension   . Seizure disorder [facility])     Patient Active Problem List   Diagnosis Date Noted  . Overweight (BMI 25.0-29.9) 12/12/2020  . Acute CVA (cerebrovascular accident) (HCC) 12/11/2020  . Status epilepticus (HCC) 12/05/2020  . Gait difficulty 09/27/2020  . Impaired functional mobility, balance, gait, and endurance 09/27/2020  . Pain due to onychomycosis of toenails of both feet 12/10/2019  . Diabetes mellitus without complication (HCC) 12/10/2019  . Aortic dissection (HCC) 03/07/2019  . Ascending aortic aneurysm (HCC) 03/07/2019  . 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 Vascular dementia (HCC) 03/07/2019  . Nicotine abuse 03/07/2019  . CVA (cerebral vascular accident) (HCC) 03/07/2019  . Increased frequency of urination 02/12/2018  . Urge incontinence of urine 02/12/2018  . Aortic dissection,  thoracoabdominal (HCC) 12/07/2017  . Anxiety 03/27/2016  . H/O: CVA (cerebrovascular accident) 03/27/2016  . Hyperlipidemia 03/27/2016  . Tobacco abuse 03/27/2016  . Adjustment disorder with mixed anxiety and depressed mood 05/27/2015  . Essential hypertension   . Hypokalemia   . Seizure (HCC) 05/25/2015  . Acute encephalopathy 05/25/2015  . Delirium   . Primary osteoarthritis of both hips 10/22/2014    Past Surgical History:  Procedure Laterality Date  . TRANSCAROTID ARTERY REVASCULARIZATION Right 12/15/2020   Procedure: TRANSCAROTID ARTERY REVASCULARIZATION RIGHT;  Surgeon: Cephus Shelling, MD;  Location: [facility] OR;  Service: Vascular;  Laterality: Right;       Family History  Family history unknown: Yes    Social History   Tobacco Use  . Smoking status: Current Every Day Smoker  . Smokeless tobacco: Never Used  Substance Use Topics  . Alcohol use: No  . Drug use: No    Home Medications Prior to Admission medications   Medication Sig Start Date End Date Taking? Authorizing Provider  amLODipine (NORVASC) 10 MG tablet Take 10 mg by mouth daily with lunch. 11/17/20   [provider]  aspirin EC 81 MG tablet Take 1 tablet (81 mg total) by mouth daily. Swallow whole. 12/20/20 12/20/21  Pokhrel, Rebekah Chesterfield, MD  atorvastatin (LIPITOR) 80 MG tablet Take 1 tablet (80 mg total) by mouth at bedtime. 12/20/20 12/20/21  Pokhrel, Rebekah Chesterfield, MD  clopidogrel (PLAVIX) 75 MG tablet Take 1 tablet (75 mg total) by mouth daily. 12/20/20   Pokhrel, Rebekah Chesterfield, MD  donepezil (ARICEPT) 5 MG  tablet Take 5 mg by mouth daily with lunch. 11/17/20   [provider]  hydrALAZINE (APRESOLINE) 25 MG tablet Take 25 mg by mouth at bedtime.    [provider]  labetalol (NORMODYNE) 100 MG tablet Take 0.5 tablets (50 mg total) by mouth 2 (two) times daily. Patient taking differently: Take 100 mg by mouth daily with lunch. 03/09/19   Leroy Sea, MD  lisinopril (ZESTRIL) 20 MG tablet  Take 20 mg by mouth daily with lunch. 11/17/20   [provider]  mirabegron ER (MYRBETRIQ) 50 MG TB24 tablet Take 50 mg by mouth daily with lunch.    [provider]  mirtazapine (REMERON) 7.5 MG tablet Take 7.5 mg by mouth daily with lunch. 11/17/20   [provider]  nicotine (NICODERM CQ - DOSED IN MG/24 HOURS) 14 mg/24hr patch Place 1 patch (14 mg total) onto the skin daily. 12/21/20   Pokhrel, Rebekah Chesterfield, MD  phenytoin (DILANTIN) 200 MG ER capsule Take 2 capsules (400 mg total) by mouth daily. 12/06/20   Arnetha Courser, MD  sertraline (ZOLOFT) 100 MG tablet Take 100 mg by mouth daily with lunch.    [provider]  tamsulosin (FLOMAX) 0.4 MG CAPS capsule Take 0.4 mg by mouth daily with lunch. 11/17/20   [provider]    Allergies    Patient has no known allergies.  Review of Systems   Review of Systems  Constitutional: Negative for chills and fever.  HENT: Negative for congestion and facial swelling.   Eyes: Negative for discharge and visual disturbance.  Respiratory: Negative for shortness of breath.   Cardiovascular: Negative for chest pain and palpitations.  Gastrointestinal: Negative for abdominal pain, diarrhea and vomiting.  Musculoskeletal: Negative for arthralgias and myalgias.  Skin: Negative for color change and rash.  Neurological: Positive for headaches. Negative for tremors and syncope.  Psychiatric/Behavioral: Negative for confusion and dysphoric mood.    Physical Exam Updated Vital Signs BP (!) 143/83   Pulse 68   Temp 98 F (36.7 C) (Oral)   Resp 20   Ht 6' (1.829 m)   Wt 94 kg   SpO2 100%   BMI 28.11 kg/m   Physical Exam Vitals and nursing note reviewed.  Constitutional:      Appearance: He is well-developed and well-nourished.  HENT:     Head: Normocephalic.     Comments: Small skin tear to the frontal region of the head. Eyes:     Extraocular Movements: EOM normal.     Pupils: Pupils are equal, round, and  reactive to light.  Neck:     Vascular: No JVD.  Cardiovascular:     Rate and Rhythm: Normal rate and regular rhythm.     Heart sounds: No murmur heard. No friction rub. No gallop.   Pulmonary:     Effort: No respiratory distress.     Breath sounds: No wheezing.  Abdominal:     General: There is no distension.     Tenderness: There is no guarding or rebound.  Musculoskeletal:        General: Normal range of motion.     Cervical back: Normal range of motion and neck supple.     Comments: Palpated from head to toe without noted areas of bony tenderness.  Of note the patient did defer back exam.  Skin:    Coloration: Skin is not pale.     Findings: No rash.  Neurological:     Mental Status: He is alert  and oriented to person, place, and time.  Psychiatric:        Mood and Affect: Mood and affect normal.        Behavior: Behavior normal.     ED Results / Procedures / Treatments   Labs (all labs ordered are listed, but only abnormal results are displayed) Labs Reviewed - No data to display  EKG None  Radiology CT Head Wo Contrast  Result Date: 01/15/2021 CLINICAL DATA:  Larey Seat from bed last night.  Head injury. EXAM: CT HEAD WITHOUT CONTRAST TECHNIQUE: Contiguous axial images were obtained from the base of the skull through the vertex without intravenous contrast. COMPARISON:  12/11/2020 FINDINGS: Brain: Generalized atrophy. Chronic small-vessel ischemic changes of the cerebral hemispheric white matter. Old right parieto-occipital 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 small vessel infarctions of the thalami and basal ganglia. No acute infarction, mass lesion, hemorrhage, hydrocephalus or extra-axial collection. Vascular: There is atherosclerotic calcification of the major vessels at the base of the brain. Skull: Negative Sinuses/Orbits: Clear/normal Other: None IMPRESSION: No acute or traumatic finding. Atrophy and chronic ischemic changes as outlined  above. Electronically Signed   By: Paulina Fusi M.D.   On: 01/15/2021 23:52    Procedures Procedures   Medications Ordered in ED Medications - No data to display  ED Course  I have reviewed the triage vital signs and the nursing notes.  Pertinent labs & imaging results that were available during my care of the patient were reviewed by me and considered in my medical decision making (see chart for details).    MDM Rules/Calculators/A&P                          70 yo M with a chief complaints of a fall.  Nonsyncopal by history.  Complaining of a head injury.  Patient should be on antiplatelet agents, will obtain a CT scan of the head.  CT of the head negative.  Discharge home.  11:59 PM:  I have discussed the diagnosis/risks/treatment options with the patient and believe the pt to be eligible for discharge home to follow-up with PCP. We also discussed returning to the ED immediately if new or worsening sx occur. We discussed the sx which are most concerning (e.g., sudden worsening pain, fever, inability to tolerate by mouth) that necessitate immediate return. Medications administered to the patient during their visit and any new prescriptions provided to the patient are listed below.  Medications given during this visit Medications - No data to display   The patient appears reasonably screen and/or stabilized for discharge and I doubt any other medical condition or other [facility] requiring further screening, evaluation, or treatment in the ED at this time prior to discharge.   Final Clinical Impression(s) / ED Diagnoses Final diagnoses:  Fall, initial encounter  Minor head injury, initial encounter    Rx / DC Orders ED Discharge Orders    None       Melene Plan, DO 01/15/21 2359

## 2021-01-15 NOTE — Discharge Instructions (Signed)
Return for confusion, worsening headache, persistent vomiting.

## 2021-01-15 NOTE — ED Triage Notes (Signed)
Pt BIB [facility] from [facility] due to fall out of bed last night. Small wound to top of head noted, no bleeding at this time. When asked if he hurts anywhere, patient states "I hurt all over, all the time." Patient is AxOx1 at baseline, calm and cooperative with this RN.

## 2021-01-17 ENCOUNTER — Telehealth (HOSPITAL_COMMUNITY): Payer: Self-pay

## 2021-01-17 NOTE — Telephone Encounter (Signed)
Called to confirm appointment that was scheduled on 01/18/2021, I was informed by patients daughter Art Buff that patient is in facility long term due to a stroke, Our services where no longer needed.   [employer]

## 2021-01-18 ENCOUNTER — Encounter: Payer: Medicare Other | Admitting: Vascular Surgery

## 2021-01-18 ENCOUNTER — Ambulatory Visit (HOSPITAL_COMMUNITY): Payer: Medicare Other

## 2021-04-24 ENCOUNTER — Emergency Department (HOSPITAL_COMMUNITY)
Admission: EM | Admit: 2021-04-24 | Discharge: 2021-04-26 | Disposition: A | Discharge status: Home / Self Care | Payer: Medicare Other | Attending: Emergency Medicine | Admitting: Emergency Medicine

## 2021-04-24 ENCOUNTER — Other Ambulatory Visit: Payer: Self-pay

## 2021-04-24 DIAGNOSIS — Z7982 Long term (current) use of aspirin: Secondary | ICD-10-CM | POA: Diagnosis not present

## 2021-04-24 DIAGNOSIS — E119 Type 2 diabetes mellitus without complications: Secondary | ICD-10-CM | POA: Diagnosis not present

## 2021-04-24 DIAGNOSIS — Z7902 Long term (current) use of antithrombotics/antiplatelets: Secondary | ICD-10-CM | POA: Insufficient documentation

## 2021-04-24 DIAGNOSIS — Z046 Encounter for general psychiatric examination, requested by authority: Secondary | ICD-10-CM | POA: Diagnosis present

## 2021-04-24 DIAGNOSIS — Z20822 Contact with and (suspected) exposure to covid-19: Secondary | ICD-10-CM | POA: Insufficient documentation

## 2021-04-24 DIAGNOSIS — N390 Urinary tract infection, site not specified: Secondary | ICD-10-CM | POA: Diagnosis not present

## 2021-04-24 DIAGNOSIS — R4689 Other symptoms and signs involving appearance and behavior: Secondary | ICD-10-CM

## 2021-04-24 DIAGNOSIS — F17200 Nicotine dependence, unspecified, uncomplicated: Secondary | ICD-10-CM | POA: Diagnosis not present

## 2021-04-24 DIAGNOSIS — Z79899 Other long term (current) drug therapy: Secondary | ICD-10-CM | POA: Diagnosis not present

## 2021-04-24 DIAGNOSIS — R4182 Altered mental status, unspecified: Secondary | ICD-10-CM | POA: Diagnosis not present

## 2021-04-24 DIAGNOSIS — B9620 Unspecified Escherichia coli [E. coli] as the cause of diseases classified elsewhere: Secondary | ICD-10-CM | POA: Diagnosis not present

## 2021-04-24 DIAGNOSIS — I10 Essential (primary) hypertension: Secondary | ICD-10-CM | POA: Insufficient documentation

## 2021-04-24 DIAGNOSIS — F0390 Unspecified dementia without behavioral disturbance: Secondary | ICD-10-CM | POA: Insufficient documentation
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
## 2021-04-25 ENCOUNTER — Other Ambulatory Visit: Payer: Self-pay

## 2021-04-25 ENCOUNTER — Encounter (HOSPITAL_COMMUNITY): Payer: Self-pay | Admitting: Emergency Medicine

## 2021-04-25 DIAGNOSIS — R4182 Altered mental status, unspecified: Secondary | ICD-10-CM | POA: Diagnosis not present

## 2021-04-25 LAB — RAPID URINE DRUG SCREEN, HOSP PERFORMED
Amphetamines: NOT DETECTED
Barbiturates: NOT DETECTED
Benzodiazepines: NOT DETECTED
Cocaine: NOT DETECTED
Opiates: NOT DETECTED
Tetrahydrocannabinol: NOT DETECTED

## 2021-04-25 LAB — CBC WITH DIFFERENTIAL/PLATELET
Abs Immature Granulocytes: 0.02 10*3/uL (ref 0.00–0.07)
Basophils Absolute: 0 10*3/uL (ref 0.0–0.1)
Basophils Relative: 1 %
Eosinophils Absolute: 0.2 10*3/uL (ref 0.0–0.5)
Eosinophils Relative: 2 %
HCT: 35.1 % — ABNORMAL LOW (ref 39.0–52.0)
Hemoglobin: 11.4 g/dL — ABNORMAL LOW (ref 13.0–17.0)
Immature Granulocytes: 0 %
Lymphocytes Relative: 16 %
Lymphs Abs: 1.1 10*3/uL (ref 0.7–4.0)
MCH: 32.6 pg (ref 26.0–34.0)
MCHC: 32.5 g/dL (ref 30.0–36.0)
MCV: 100.3 fL — ABNORMAL HIGH (ref 80.0–100.0)
Monocytes Absolute: 0.8 10*3/uL (ref 0.1–1.0)
Monocytes Relative: 12 %
Neutro Abs: 4.6 10*3/uL (ref 1.7–7.7)
Neutrophils Relative %: 69 %
Platelets: 228 10*3/uL (ref 150–400)
RBC: 3.5 MIL/uL — ABNORMAL LOW (ref 4.22–5.81)
RDW: 14.5 % (ref 11.5–15.5)
WBC: 6.7 10*3/uL (ref 4.0–10.5)
nRBC: 0 % (ref 0.0–0.2)

## 2021-04-25 LAB — COMPREHENSIVE METABOLIC PANEL
ALT: 13 U/L (ref 0–44)
AST: 13 U/L — ABNORMAL LOW (ref 15–41)
Albumin: 3.6 g/dL (ref 3.5–5.0)
Alkaline Phosphatase: 101 U/L (ref 38–126)
Anion gap: 7 (ref 5–15)
BUN: 23 mg/dL (ref 8–23)
CO2: 29 mmol/L (ref 22–32)
Calcium: 8.6 mg/dL — ABNORMAL LOW (ref 8.9–10.3)
Chloride: 102 mmol/L (ref 98–111)
Creatinine, Ser: 1.08 mg/dL (ref 0.61–1.24)
GFR, Estimated: >60 mL/min (ref >60)
Glucose, Bld: 137 mg/dL — ABNORMAL HIGH (ref 70–99)
Potassium: 4.2 mmol/L (ref 3.5–5.1)
Sodium: 138 mmol/L (ref 135–145)
Total Bilirubin: 0.3 mg/dL (ref 0.3–1.2)
Total Protein: 6.8 g/dL (ref 6.5–8.1)

## 2021-04-25 LAB — URINALYSIS, ROUTINE W REFLEX MICROSCOPIC
Bilirubin Urine: NEGATIVE
Glucose, UA: NEGATIVE mg/dL
Hgb urine dipstick: NEGATIVE
Ketones, ur: NEGATIVE mg/dL
Nitrite: POSITIVE — AB
Protein, ur: NEGATIVE mg/dL
Specific Gravity, Urine: 1.009 (ref 1.005–1.030)
pH: 7 (ref 5.0–8.0)

## 2021-04-25 LAB — RESP PANEL BY RT-PCR (FLU A&B, COVID) ARPGX2
Influenza A by PCR: NEGATIVE
Influenza B by PCR: NEGATIVE
SARS Coronavirus 2 by RT PCR: NEGATIVE

## 2021-04-25 LAB — PHENYTOIN LEVEL, TOTAL: Phenytoin Lvl: 10.2 ug/mL (ref 10.0–20.0)

## 2021-04-25 LAB — ETHANOL: Alcohol, Ethyl (B): <10 mg/dL (ref <10)

## 2021-04-25 MED ORDER — MIRTAZAPINE 7.5 MG PO TABS
7.5000 mg | ORAL_TABLET | Freq: Every day | ORAL | Status: DC
  Administered 2021-04-25: 7.5 mg via ORAL
  Filled 2021-04-25: qty 1

## 2021-04-25 MED ORDER — LABETALOL HCL 100 MG PO TABS: 100.0000 mg | ORAL_TABLET | Freq: Every day | ORAL | Status: DC

## 2021-04-25 MED ORDER — ATORVASTATIN CALCIUM 40 MG PO TABS
80.0000 mg | ORAL_TABLET | Freq: Every day | ORAL | Status: DC
  Administered 2021-04-25: 80 mg via ORAL
  Filled 2021-04-25: qty 2

## 2021-04-25 MED ORDER — ASPIRIN EC 81 MG PO TBEC
81.0000 mg | DELAYED_RELEASE_TABLET | Freq: Every day | ORAL | Status: DC
  Administered 2021-04-25: 81 mg via ORAL
  Filled 2021-04-25: qty 1

## 2021-04-25 MED ORDER — SERTRALINE HCL 50 MG PO TABS
100.0000 mg | ORAL_TABLET | Freq: Every day | ORAL | Status: DC
  Administered 2021-04-25: 100 mg via ORAL
  Filled 2021-04-25: qty 2

## 2021-04-25 MED ORDER — ZIPRASIDONE MESYLATE 20 MG IM SOLR: 20.0000 mg | Freq: Once | INTRAMUSCULAR | Status: DC | PRN

## 2021-04-25 MED ORDER — AMLODIPINE BESYLATE 5 MG PO TABS
10.0000 mg | ORAL_TABLET | Freq: Every day | ORAL | Status: DC
  Administered 2021-04-25: 10 mg via ORAL
  Filled 2021-04-25: qty 2

## 2021-04-25 MED ORDER — TAMSULOSIN HCL 0.4 MG PO CAPS
0.4000 mg | ORAL_CAPSULE | Freq: Every day | ORAL | Status: DC
  Administered 2021-04-25: 0.4 mg via ORAL
  Filled 2021-04-25: qty 1

## 2021-04-25 MED ORDER — CLOPIDOGREL BISULFATE 75 MG PO TABS: 75.0000 mg | ORAL_TABLET | Freq: Every day | ORAL | Status: DC

## 2021-04-25 MED ORDER — FOSFOMYCIN TROMETHAMINE 3 G PO PACK
3.0000 g | PACK | Freq: Once | ORAL | Status: AC
  Administered 2021-04-25: 3 g via ORAL
  Filled 2021-04-25: qty 3

## 2021-04-25 MED ORDER — HYDRALAZINE HCL 25 MG PO TABS
25.0000 mg | ORAL_TABLET | Freq: Every day | ORAL | Status: DC
  Administered 2021-04-25: 25 mg via ORAL
  Filled 2021-04-25: qty 1

## 2021-04-25 MED ORDER — LISINOPRIL 20 MG PO TABS
20.0000 mg | ORAL_TABLET | Freq: Every day | ORAL | Status: DC
  Administered 2021-04-25: 20 mg via ORAL
  Filled 2021-04-25: qty 1

## 2021-04-25 MED ORDER — MIRABEGRON ER 25 MG PO TB24: 50.0000 mg | ORAL_TABLET | Freq: Every day | ORAL | Status: DC

## 2021-04-25 MED ORDER — PHENYTOIN SODIUM EXTENDED 100 MG PO CAPS
400.0000 mg | ORAL_CAPSULE | Freq: Every day | ORAL | Status: DC
  Administered 2021-04-25: 400 mg via ORAL
  Filled 2021-04-25: qty 4

## 2021-04-25 MED ORDER — DONEPEZIL HCL 5 MG PO TABS
5.0000 mg | ORAL_TABLET | Freq: Every day | ORAL | Status: DC
  Administered 2021-04-25: 5 mg via ORAL
  Filled 2021-04-25: qty 1

## 2021-04-25 MED ORDER — PHENYTOIN SODIUM EXTENDED 100 MG PO CAPS: 300.0000 mg | ORAL_CAPSULE | Freq: Every day | ORAL | Status: DC

## 2021-04-25 NOTE — ED Provider Notes (Addendum)
[facility]-EMERGENCY DEPT Provider Note: Philip DellJ. Lane Keela Rubert, MD, FACEP  CSN: [id number] MRN: [id number] ARRIVAL: 04/24/21 at 2347 ROOM: WA02/WA02   CHIEF COMPLAINT  Manic Behavior  Level 5 caveat: Dementia; manic behavior HISTORY OF PRESENT ILLNESS  04/25/21 1:03 AM Philip LoraLavern Leonette MostCharles is a 70 y.o. male was sent from his living facility after patient was found in another patient's room, thinking he was at his own home.  He was aggressive towards staff.  When police arrived he threatened to set the facility on fire.  Police then secured an involuntary commitment.  He has been calm after being brought by police officers.  Although he has a history of dementia and delirium he does not have a history of psychiatric illness.  The patient is cooperative with me and complains only of hurting all over, particularly his ankles and wrists.   Past Medical History:  Diagnosis Date  . Dementia (HCC)   . Diabetes mellitus without complication (HCC)   . Hypertension   . Seizure disorder [facility](HCC)     Past Surgical History:  Procedure Laterality Date  . TRANSCAROTID ARTERY REVASCULARIZATION Right 12/15/2020   Procedure: TRANSCAROTID ARTERY REVASCULARIZATION RIGHT;  Surgeon: Cephus Shellinglark, Christopher J, MD;  [facility];  Service: Vascular;  Laterality: Right;    Family History  Family history unknown: Yes    Social History   Tobacco Use  . Smoking status: Current Every Day Smoker  . Smokeless tobacco: Never Used  Substance Use Topics  . Alcohol use: No  . Drug use: No    Prior to Admission medications   Medication Sig Start Date End Date Taking? Authorizing Provider  amLODipine (NORVASC) 10 MG tablet Take 10 mg by mouth daily with lunch. 11/17/20   [provider]  aspirin EC 81 MG tablet Take 1 tablet (81 mg total) by mouth daily. Swallow whole. 12/20/20 12/20/21  Pokhrel, Rebekah ChesterfieldLaxman, MD  atorvastatin (LIPITOR) 80 MG tablet Take 1 tablet (80 mg total) by mouth at bedtime. 12/20/20 12/20/21   Pokhrel, Rebekah ChesterfieldLaxman, MD  clopidogrel (PLAVIX) 75 MG tablet Take 1 tablet (75 mg total) by mouth daily. 12/20/20   Pokhrel, Rebekah ChesterfieldLaxman, MD  donepezil (ARICEPT) 5 MG tablet Take 5 mg by mouth daily with lunch. 11/17/20   [provider]  hydrALAZINE (APRESOLINE) 25 MG tablet Take 25 mg by mouth at bedtime.    [provider]  labetalol (NORMODYNE) 100 MG tablet Take 0.5 tablets (50 mg total) by mouth 2 (two) times daily. Patient taking differently: Take 100 mg by mouth daily with lunch. 03/09/19   Leroy SeaSingh, Prashant K, MD  lisinopril (ZESTRIL) 20 MG tablet Take 20 mg by mouth daily with lunch. 11/17/20   [provider]  mirabegron ER (MYRBETRIQ) 50 MG TB24 tablet Take 50 mg by mouth daily with lunch.    [provider]  mirtazapine (REMERON) 7.5 MG tablet Take 7.5 mg by mouth daily with lunch. 11/17/20   [provider]  nicotine (NICODERM CQ - DOSED IN MG/24 HOURS) 14 mg/24hr patch Place 1 patch (14 mg total) onto the skin daily. 12/21/20   Pokhrel, Rebekah ChesterfieldLaxman, MD  phenytoin (DILANTIN) 200 MG ER capsule Take 2 capsules (400 mg total) by mouth daily. 12/06/20   Arnetha CourserAmin, Sumayya, MD  sertraline (ZOLOFT) 100 MG tablet Take 100 mg by mouth daily with lunch.    [provider]  tamsulosin (FLOMAX) 0.4 MG CAPS capsule Take 0.4 mg by mouth daily with lunch. 11/17/20   [provider]    Allergies  Patient has no known allergies.   REVIEW OF SYSTEMS  Cannot assess, level 5 caveat   PHYSICAL EXAMINATION  Initial Vital Signs Blood pressure (!) 147/103, pulse 82, temperature 97.8 F (36.6 C), temperature source Oral, resp. rate 16, SpO2 100 %.  Examination General: Well-developed, well-nourished male in no acute distress; appearance consistent with age of record HENT: normocephalic; atraumatic Eyes: Patient would not open eyes Neck: supple Heart: regular rate and rhythm Lungs: clear to auscultation bilaterally Abdomen: soft; nondistended; nontender; bowel  sounds present Extremities: No deformity; full range of motion; pulses normal Neurologic: Sleeping but readily awakened; noted to move all extremities Skin: Warm and dry Psychiatric: Calm, cooperative   RESULTS  Summary of this visit's results, reviewed and interpreted by myself:   EKG Interpretation  Date/Time:  Monday Apr 25 2021 01:12:37 EDT Ventricular Rate:  64 PR Interval:  221 QRS Duration: 94 QT Interval:  440 QTC Calculation: 454 R Axis:   54 Text Interpretation: Sinus rhythm Prolonged PR interval Probable left atrial enlargement Abnormal T, consider ischemia, lateral leads No significant change was found Confirmed by Philip Robinson ([id number]) on 04/25/2021 1:16:45 AM      Laboratory Studies: Results for orders placed or performed during the hospital encounter of 04/24/21 (from the past 24 hour(s))  CBC with Differential/Platelet     Status: Abnormal   Collection Time: 04/25/21  1:57 AM  Result Value Ref Range   WBC 6.7 4.0 - 10.5 K/uL   RBC 3.50 (L) 4.22 - 5.81 MIL/uL   Hemoglobin 11.4 (L) 13.0 - 17.0 g/dL   HCT 19.1 (L) 47.8 - 29.5 %   MCV 100.3 (H) 80.0 - 100.0 fL   MCH 32.6 26.0 - 34.0 pg   MCHC 32.5 30.0 - 36.0 g/dL   RDW 62.1 30.8 - 65.7 %   Platelets 228 150 - 400 K/uL   nRBC 0.0 0.0 - 0.2 %   Neutrophils Relative % 69 %   Neutro Abs 4.6 1.7 - 7.7 K/uL   Lymphocytes Relative 16 %   Lymphs Abs 1.1 0.7 - 4.0 K/uL   Monocytes Relative 12 %   Monocytes Absolute 0.8 0.1 - 1.0 K/uL   Eosinophils Relative 2 %   Eosinophils Absolute 0.2 0.0 - 0.5 K/uL   Basophils Relative 1 %   Basophils Absolute 0.0 0.0 - 0.1 K/uL   Immature Granulocytes 0 %   Abs Immature Granulocytes 0.02 0.00 - 0.07 K/uL  Comprehensive metabolic panel     Status: Abnormal   Collection Time: 04/25/21  1:57 AM  Result Value Ref Range   Sodium 138 135 - 145 mmol/L   Potassium 4.2 3.5 - 5.1 mmol/L   Chloride 102 98 - 111 mmol/L   CO2 29 22 - 32 mmol/L   Glucose, Bld 137 (H) 70 - 99 mg/dL    BUN 23 8 - 23 mg/dL   Creatinine, Ser 8.46 0.61 - 1.24 mg/dL   Calcium 8.6 (L) 8.9 - 10.3 mg/dL   Total Protein 6.8 6.5 - 8.1 g/dL   Albumin 3.6 3.5 - 5.0 g/dL   AST 13 (L) 15 - 41 U/L   ALT 13 0 - 44 U/L   Alkaline Phosphatase 101 38 - 126 U/L   Total Bilirubin 0.3 0.3 - 1.2 mg/dL   GFR, Estimated >96 >29 mL/min   Anion gap 7 5 - 15  Ethanol     Status: None   Collection Time: 04/25/21  1:57 AM  Result Value Ref Range  Alcohol, Ethyl (B) <10 <10 mg/dL  Phenytoin level, total     Status: None   Collection Time: 04/25/21  1:57 AM  Result Value Ref Range   Phenytoin Lvl 10.2 10.0 - 20.0 ug/mL  Resp Panel by RT-PCR (Flu A&B, Covid)     Status: None   Collection Time: 04/25/21  5:46 AM   Specimen: Nasopharyngeal(NP) swabs in vial transport medium  Result Value Ref Range   SARS Coronavirus 2 by RT PCR NEGATIVE NEGATIVE   Influenza A by PCR NEGATIVE NEGATIVE   Influenza B by PCR NEGATIVE NEGATIVE  Urinalysis, Routine w reflex microscopic     Status: Abnormal   Collection Time: 04/25/21  5:48 AM  Result Value Ref Range   Color, Urine YELLOW YELLOW   APPearance CLEAR CLEAR   Specific Gravity, Urine 1.009 1.005 - 1.030   pH 7.0 5.0 - 8.0   Glucose, UA NEGATIVE NEGATIVE mg/dL   Hgb urine dipstick NEGATIVE NEGATIVE   Bilirubin Urine NEGATIVE NEGATIVE   Ketones, ur NEGATIVE NEGATIVE mg/dL   Protein, ur NEGATIVE NEGATIVE mg/dL   Nitrite POSITIVE (A) NEGATIVE   Leukocytes,Ua LARGE (A) NEGATIVE   RBC / HPF 0-5 0 - 5 RBC/hpf   WBC, UA 11-20 0 - 5 WBC/hpf   Bacteria, UA MANY (A) NONE SEEN   Squamous Epithelial / LPF 0-5 0 - 5  Rapid urine drug screen (hospital performed)     Status: None   Collection Time: 04/25/21  5:48 AM  Result Value Ref Range   Opiates NONE DETECTED NONE DETECTED   Cocaine NONE DETECTED NONE DETECTED   Benzodiazepines NONE DETECTED NONE DETECTED   Amphetamines NONE DETECTED NONE DETECTED   Tetrahydrocannabinol NONE DETECTED NONE DETECTED   Barbiturates NONE  DETECTED NONE DETECTED   Imaging Studies: No results found.  ED COURSE and MDM  Nursing notes, initial and subsequent vitals signs, including pulse oximetry, reviewed and interpreted by myself.  Vitals:   04/25/21 0115 04/25/21 0130 04/25/21 0230 04/25/21 0300  BP: 124/86 131/78 115/87 129/85  Pulse: 63 62 63 65  Resp: 17 15 17 14   Temp:      TempSrc:      SpO2: 100% 100% 100% 100%   Medications  ziprasidone (GEODON) injection 20 mg (has no administration in time range)  amLODipine (NORVASC) tablet 10 mg (has no administration in time range)  aspirin EC tablet 81 mg (has no administration in time range)  atorvastatin (LIPITOR) tablet 80 mg (has no administration in time range)  clopidogrel (PLAVIX) tablet 75 mg (has no administration in time range)  donepezil (ARICEPT) tablet 5 mg (has no administration in time range)  hydrALAZINE (APRESOLINE) tablet 25 mg (has no administration in time range)  labetalol (NORMODYNE) tablet 100 mg (has no administration in time range)  lisinopril (ZESTRIL) tablet 20 mg (has no administration in time range)  mirabegron ER (MYRBETRIQ) tablet 50 mg (has no administration in time range)  mirtazapine (REMERON) tablet 7.5 mg (has no administration in time range)  phenytoin (DILANTIN) ER capsule 400 mg (has no administration in time range)  sertraline (ZOLOFT) tablet 100 mg (has no administration in time range)  tamsulosin (FLOMAX) capsule 0.4 mg (has no administration in time range)  fosfomycin (MONUROL) packet 3 g (has no administration in time range)   6:26 AM Urinalysis is concerning for a UTI which could be the cause of the patient's acute mental status change.  We will go ahead and treat with a dose of fosfomycin.  PROCEDURES  Procedures   ED DIAGNOSES     ICD-10-CM   1. Aggressive behavior  R46.89   2. Involuntary commitment  Z04.6   3. Lower urinary tract infectious disease  N39.0        Kyros Salzwedel, MD 04/25/21 0712     Philip Libra, MD 04/25/21 [phone]

## 2021-04-25 NOTE — ED Notes (Signed)
Pt is clean and he has a condom catheter and it working great.

## 2021-04-25 NOTE — ED Notes (Signed)
[facility] called for transport.  

## 2021-04-25 NOTE — Discharge Instructions (Addendum)
For your behavioral health needs, you are advised to continue treatment with your regular outpatient provider. °

## 2021-04-25 NOTE — ED Notes (Signed)
Attempted to call report to [facility], was transfer to the nurse but no one answered that line.

## 2021-04-25 NOTE — BH Assessment (Addendum)
Comprehensive Clinical Assessment (CCA) Note  04/25/2021 Philip Robinson [id number]   DISPOSITIONEffie Shy NP recommends patient be observed and monitored.    Flowsheet Row ED from 04/24/2021 in [facility] ED from 01/15/2021 in [facility]  C-SSRS RISK CATEGORY No Risk No Risk    The patient demonstrates the following risk factors for suicide: Chronic risk factors for suicide include: N/A. Acute risk factors for suicide include: N/A. Protective factors for this patient include: positive social support. Considering these factors, the overall suicide risk at this point appears to be low. Patient is not appropriate for outpatient follow up.  Patient is a 70 year old male that presents this date with AMS at his assisted living facility [facility]) after he was found in another patient's room thinking it was his home. Patient per notes was aggressive with staff and was transported to [facility] with IVC. Per IVC: Officers responded to facility where they found patient agitated stating he owned "the whole place and he can do whatever he wants". Patient made threats to burn the facility down and became aggressive with staff and responding officers. Patient was transported to [facility] for a evaluation.  Upon interviewing this writer found patient in his bed dressed in scrubs having breakfast. Patient was slow to respond to questions although reported he was in the hospital to "get a splinter removed from his finger." When asked in reference to S/I or H/I patient would not respond and kept asking "who are you." Patient would also not answer if he was experiencing any AVH. Patient is aware he is in the hospital although cannot recall the name of this facility or year/date. Due to current AMS patient could not participate in the assessment and collateral was obtained from daughter Philip Robinson [id number]. Daughter reports that patient was  diagnosed with dementia over a year ago and is currently receiving services from Philip Czar MD (neurologist) in [place] who assists with ongoing needs to include depression as the patient is prescribed Zoloft 100 mg daily and Remeron 7.5 daily. Daughter reports patient has been on those medications for over one year and states he is currently compliant with that regimen. Daughter reports patient had been residing with her until March of this year when patient was relocated to [facility] (assisted living) due to patient's memory decompensating along with other medical needs she could no longer care for him in her home. Patient also receives PCP services from Philip Lan MD in [place] to assist with medical needs. Daughter reports patient's memory has gotten worse over the last few months with him becoming agitated and often becomes aggressive with staff. Daughter is in the process of obtaining POA and guardianship although that is in the process. Daughter states she feels medications need to be adjusted.   Per EDP note this date on arrival:           Philip Robinson is a 70 y.o. male was sent from his living facility after patient was found in another patient's room, thinking he was at his own home.  He was aggressive towards staff.  When police arrived he threatened to set the facility on fire.  Police then secured an involuntary commitment.  He has been calm after being brought by police officers.  Per chart review patient was seen on 10/21/20 when he presented with similar symptoms. Patient is observed to be agitated as this Clinical research associate attempts to assess. Patient speaks in a low voce that is difficult  to understand and is only oriented to person and place. Patient does not appear to be responding to internal stimuli.    Chief Complaint:  Chief Complaint  Patient presents with  . Manic Behavior   Visit Diagnosis: Altered mental state     CCA Screening, Triage and Referral (STR)  Patient  Reported Information How did you hear about [place]? Self  Referral name: No data recorded Referral phone number: No data recorded  Whom do you see for routine medical problems? Primary Care  Practice/Facility Name: Philip Meeker MD Neurology and Philip Robinson PCP  Practice/Facility Phone Number: [phone]  Name of Contact: [facility]  Contact Number: above  Contact Fax Number: No data recorded Prescriber Name: No data recorded Prescriber Address (if known): No data recorded  What Is the Reason for Your Visit/Call Today? Dementia  How Long Has This Been Causing You Problems? > than 6 months  What Do You Feel Would Help You the Most Today? -- (NA)   Have You Recently Been in Any Inpatient Treatment (Hospital/Detox/Crisis Center/28-Day Program)? No  Name/Location of Program/Hospital:No data recorded How Long Were You There? No data recorded When Were You Discharged? No data recorded  Have You Ever Received Services From [facility] Before? Yes  Who Do You See at [facility]? Pt has been seen in the past for same   Have You Recently Had Any Thoughts About Hurting Yourself? No  Are You Planning to Commit Suicide/Harm Yourself At This time? No   Have you Recently Had Thoughts About Hurting Someone Philip Robinson? No  Explanation: No data recorded  Have You Used Any Alcohol or Drugs in the Past 24 Hours? No  How Long Ago Did You Use Drugs or Alcohol? No data recorded What Did You Use and How Much? No data recorded  Do You Currently Have a Therapist/Psychiatrist? No  Name of Therapist/Psychiatrist: No data recorded  Have You Been Recently Discharged From Any Office Practice or Programs? No  Explanation of Discharge From Practice/Program: No data recorded    CCA Screening Triage Referral Assessment Type of Contact: Face-to-Face  Is this Initial or Reassessment? No data recorded Date Telepsych consult ordered in [facility]:  No data recorded Time Telepsych consult ordered in  [facility]:  No data recorded  Patient Reported Information Reviewed? Yes  Patient Left Without Being Seen? No data recorded Reason for Not Completing Assessment: No data recorded  Collateral Involvement: Daughter   Does Patient Have a Court Appointed Legal Guardian? No data recorded Name and Contact of Legal Guardian: No data recorded If Minor and Not Living with Parent(s), Who has Custody? No data recorded Is CPS involved or ever been involved? Never  Is APS involved or ever been involved? Never   Patient Determined To Be At Risk for Harm To Self or Others Based on Review of Patient Reported Information or Presenting Complaint? No  Method: No data recorded Availability of Means: No data recorded Intent: No data recorded Notification Required: No data recorded Additional Information for Danger to Others Potential: No data recorded Additional Comments for Danger to Others Potential: No data recorded Are There Guns or Other Weapons in Your Home? No data recorded Types of Guns/Weapons: No data recorded Are These Weapons Safely Secured?                            No data recorded Who Could Verify You Are Able To Have These Secured: No data recorded Do You  Have any Outstanding Charges, Pending Court Dates, Parole/Probation? No data recorded Contacted To Inform of Risk of Harm To Self or Others: -- (NA)   Location of Assessment: [facility]   Does Patient Present under Involuntary Commitment? Yes  IVC Papers Initial File Date: 04/25/2021   [place] of Residence: [place]   Patient Currently Receiving the Following Services: Medication Management   Determination of Need: Urgent (48 hours)   Options For Referral: No data recorded    CCA Biopsychosocial Intake/Chief Complaint:  Dementia  Current Symptoms/Problems: Memory issues and increased aggression   Patient Reported Schizophrenia/Schizoaffective Diagnosis in Past: No   Strengths: NA  Preferences: NA  Abilities:  NA   Type of Services Patient Feels are Needed: NA   Initial Clinical Notes/Concerns: NA   Mental Health Symptoms Depression:  Difficulty Concentrating   Duration of Depressive symptoms: Greater than two weeks   Mania:  N/A   Anxiety:   N/A   Psychosis:  None   Duration of Psychotic symptoms: No data recorded  Trauma:  N/A   Obsessions:  N/A   Compulsions:  N/A   Inattention:  N/A   Hyperactivity/Impulsivity:  N/A   Oppositional/Defiant Behaviors:  None   Emotional Irregularity:  N/A   Other Mood/Personality Symptoms:  No data recorded   Mental Status Exam Appearance and self-care  Stature:  Average   Weight:  Average weight   Clothing:  Neat/clean   Grooming:  Normal   Cosmetic use:  None   Posture/gait:  Normal   Motor activity:  Agitated   Sensorium  Attention:  Distractible   Concentration:  Preoccupied   Orientation:  Person; Place   Recall/memory:  Defective in Immediate; Defective in Remote   Affect and Mood  Affect:  Anxious   Mood:  Anxious; Depressed   Relating  Eye contact:  Fleeting   Facial expression:  Depressed   Attitude toward examiner:  Defensive   Thought and Language  Speech flow: Blocked   Thought content:  Appropriate to Mood and Circumstances   Preoccupation:  None   Hallucinations:  None   Organization:  No data recorded  [employer] of Knowledge:  Fair   Intelligence:  Average   Abstraction:  -- (UTA)   Judgement:  -- (UTA)   Reality Testing:  -- (UTA)   Insight:  Poor   Decision Making:  Confused   Social Functioning  Social Maturity:  Responsible   Social Judgement:  -- Industrial/product designer)   Stress  Stressors:  Housing   Coping Ability:  Contractor Deficits:  Activities of daily living   Supports:  Family     Religion: Religion/Spirituality Are You A Religious Person?: No  Leisure/Recreation: Leisure / Recreation Do You Have Hobbies?:  No  Exercise/Diet: Exercise/Diet Do You Exercise?: No Have You Gained or Lost A Significant Amount of Weight in the Past Six Months?: No Do You Follow a Special Diet?: No Do You Have Any Trouble Sleeping?: No   CCA Employment/Education Employment/Work Situation: Employment / Work Situation Employment situation: Retired Why is patient on disability: NA How long has patient been on disability: NA What is the longest time patient has a held a job?: NA Where was the patient employed at that time?: NA  Education: Education Name of [employer]: NA Did You Have Any Scientist, research (life sciences) In School?: NA   CCA Family/Childhood History Family and Relationship History: Family history Marital status: Single  Childhood History:  Childhood History Did patient  suffer any verbal/emotional/physical/sexual abuse as a child?: No Did patient suffer from severe childhood neglect?: No Has patient ever been sexually abused/assaulted/raped as an adolescent or adult?: No Was the patient ever a victim of a crime or a disaster?: No Witnessed domestic violence?: No Has patient been affected by domestic violence as an adult?: No  Child/Adolescent Assessment:     CCA Substance Use Alcohol/Drug Use:                           ASAM's:  Six Dimensions of Multidimensional Assessment  Dimension 1:  Acute Intoxication and/or Withdrawal Potential:      Dimension 2:  Biomedical Conditions and Complications:      Dimension 3:  Emotional, Behavioral, or Cognitive Conditions and Complications:     Dimension 4:  Readiness to Change:     Dimension 5:  Relapse, Continued use, or Continued Problem Potential:     Dimension 6:  Recovery/Living Environment:     ASAM Severity Score:    ASAM Recommended Level of Treatment:     Substance use Disorder (SUD)    Recommendations for Services/Supports/Treatments:    DSM5 Diagnoses: Patient Active Problem List   Diagnosis Date Noted  . Overweight  (BMI 25.0-29.9) 12/12/2020  . Acute CVA (cerebrovascular accident) (HCC) 12/11/2020  . Status epilepticus (HCC) 12/05/2020  . Gait difficulty 09/27/2020  . Impaired functional mobility, balance, gait, and endurance 09/27/2020  . Pain due to onychomycosis of toenails of both feet 12/10/2019  . Diabetes mellitus without complication (HCC) 12/10/2019  . Aortic dissection (HCC) 03/07/2019  . Ascending aortic aneurysm (HCC) 03/07/2019  . 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 Vascular dementia (HCC) 03/07/2019  . Nicotine abuse 03/07/2019  . CVA (cerebral vascular accident) (HCC) 03/07/2019  . Increased frequency of urination 02/12/2018  . Urge incontinence of urine 02/12/2018  . Aortic dissection, thoracoabdominal (HCC) 12/07/2017  . Anxiety 03/27/2016  . H/O: CVA (cerebrovascular accident) 03/27/2016  . Hyperlipidemia 03/27/2016  . Tobacco abuse 03/27/2016  . Adjustment disorder with mixed anxiety and depressed mood 05/27/2015  . Essential hypertension   . Hypokalemia   . Seizure (HCC) 05/25/2015  . Acute encephalopathy 05/25/2015  . Delirium   . Primary osteoarthritis of both hips 10/22/2014    Patient Centered Plan: Patient is on the following Treatment Plan(s):     Referrals to Alternative Service(s): Referred to Alternative Service(s):   Place:   Date:   Time:    Referred to Alternative Service(s):   Place:   Date:   Time:    Referred to Alternative Service(s):   Place:   Date:   Time:    Referred to Alternative Service(s):   Place:   Date:   Time:     Alfredia FergusonDavid L Eriyana Sweeten, LCAS

## 2021-04-25 NOTE — ED Notes (Signed)
Daughter, [name] would like an update.

## 2021-04-25 NOTE — ED Notes (Signed)
Attempted to call report to [facility], left message for return call.

## 2021-04-25 NOTE — BH Assessment (Signed)
B[facility] Assessment Progress Note  Per Vernard Gambles, NP, this pt does not require psychiatric hospitalization at this time.  Pt presents under IVC initiated by law enforcement which has been rescinded by EDP Derwood Kaplan, MD.  Pt is psychiatrically cleared.  Discharge instructions advise pt to continue treatment with his current outpatient provider.  At 15:23 I spoke to pt's daughter, Art Buff ([phone]), who agrees to plan.  She provided phone number for [facility], pt's assisted living facility [phone]).  At 15:43 I called them and spoke to Destiny who agreed to have [facility] send pt back to them, noting that they would not provide transportation.  EDP's Derwood Kaplan, MD and Alvira Monday, MD have been notified, as well as pt's nurse, Gabriel Rung.  Doylene Canning, MA Triage Specialist [phone]

## 2021-04-25 NOTE — ED Notes (Signed)
Pt eat a 100% of his lunch.

## 2021-04-25 NOTE — ED Provider Notes (Signed)
Patient had a UTI, for which she has received fosfomycin. He has been cooperative in the ER.  No aggressive or hostile behavior.  Psychiatry team is cleared him.  Psych team asked me if patient is going to be admitted for medical reasons.  I informed him, he is being held because of psychiatric evaluation.  They have staffed the patient again and I was told by Maisie Fus, that [name], has psych cleared the patient.  He will contact the facility.  Patient is stable for discharge from our perspective.  I suppose, the behavioral change could be because of his underlying dementia.   Philip Kaplan, Philip Robinson 04/25/21 [phone]

## 2021-04-27 LAB — URINE CULTURE: Culture: >=100000 — AB

## 2021-04-28 ENCOUNTER — Telehealth: Payer: Self-pay | Admitting: *Deleted

## 2021-04-28 NOTE — Telephone Encounter (Signed)
Post ED Visit - Positive Culture Follow-up  Culture report reviewed by antimicrobial stewardship pharmacist: [facility] []  , Pharm.D. []  Enzo Bi, Pharm.D., BCPS AQ-ID []  , Pharm.D., BCPS []  Celedonio Miyamoto, Pharm.D., BCPS []  [place], Garvin Fila.D., BCPS, AAHIVP []  , Pharm.D., BCPS, AAHIVP []  Georgina Pillion, PharmD, BCPS []  , PharmD, BCPS []  [place], PharmD, BCPS []  [address], PharmD []  , PharmD, BCPS []  Estella Husk, PharmD  Pharmacy Team []  Lysle Pearl, PharmD []  , PharmD []  Phillips Climes, PharmD []  , Rph []  Agapito Games) , PharmD []  Verlan Friends, PharmD []  , PharmD []  Mervyn Gay, PharmD []  , PharmD []  Vinnie Level, PharmD []  [facility], PharmD []  , PharmD []  Len Childs, PharmD   Positive urine culture Treated with Fosfomycin in ED, organism sensitive to the same and no further patient follow-up is required at this time./Dr. , Greer Pickerel 04/28/2021, 9:34 AM

## 2022-07-21 IMAGING — CT CT HEAD W/O CM
3 series · 16 of 47 positions shown, 19 images · non-contrast
Comparison: 12/11/2020

CLINICAL DATA: Fell from bed last night.  Head injury.

EXAM:
CT HEAD WITHOUT CONTRAST
TECHNIQUE: Contiguous axial images were obtained from the base of the skull
through the vertex without intravenous contrast.

[Series 2: head wo · axial · 0.45mm/px · z∈[-134,-4]mm · 10 of 32 slices shown, 13 images]
[im 3/32  brain]
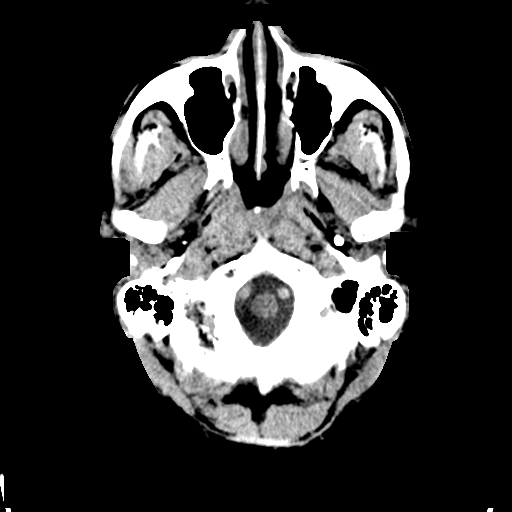
[im 3/32  bone]
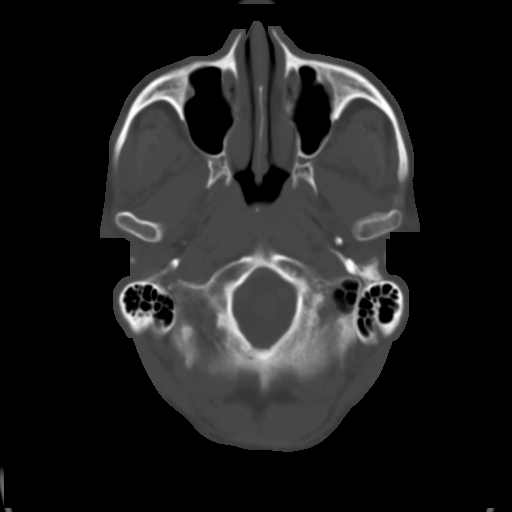
[im 6/32  brain]
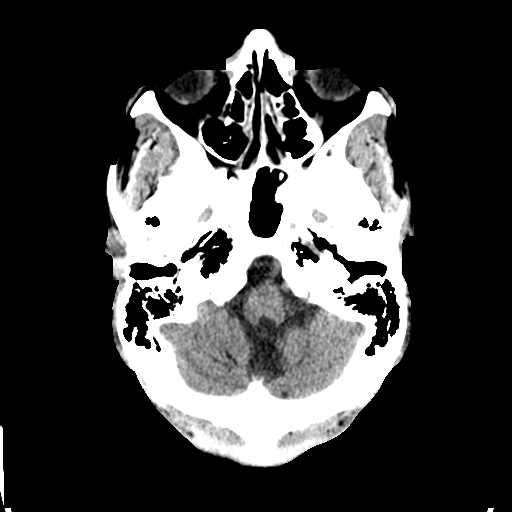
[im 9/32  brain]
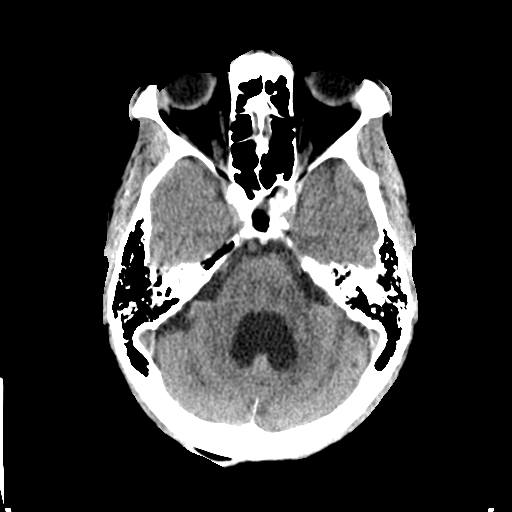
[im 11/32  brain]
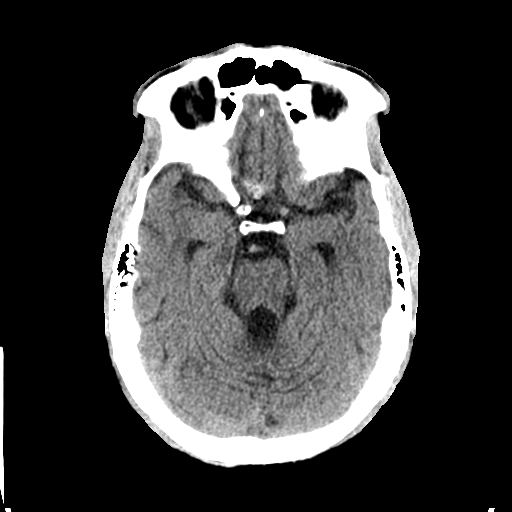
[im 14/32  brain]
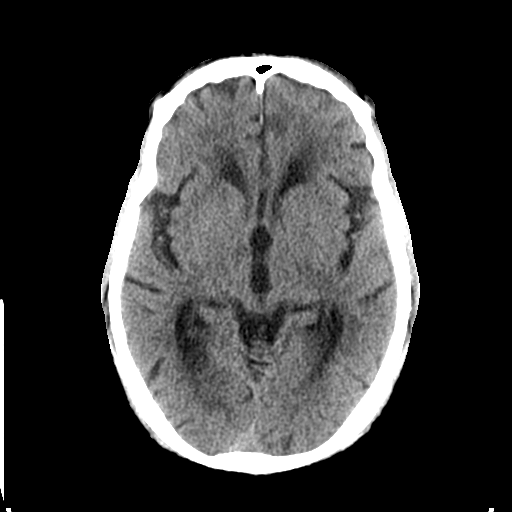
[im 14/32  bone]
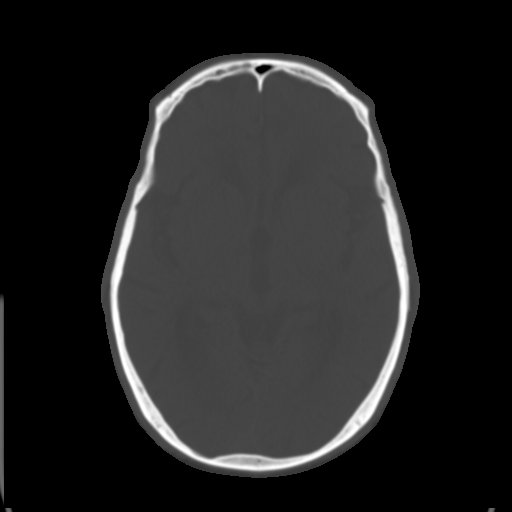
[im 18/32  brain]
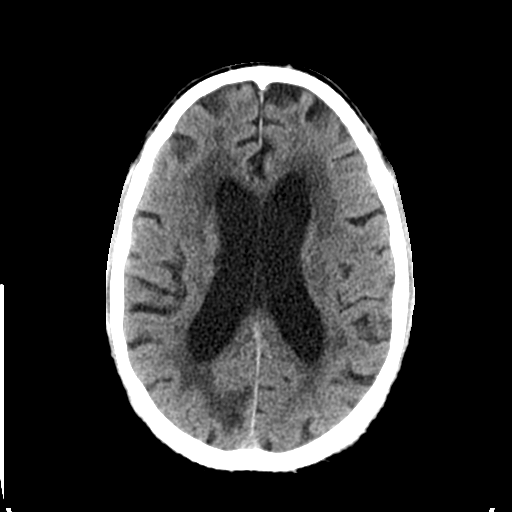
[im 21/32  brain]
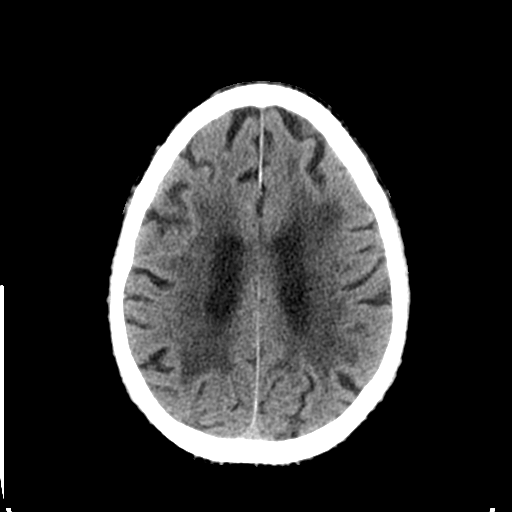
[im 24/32  brain]
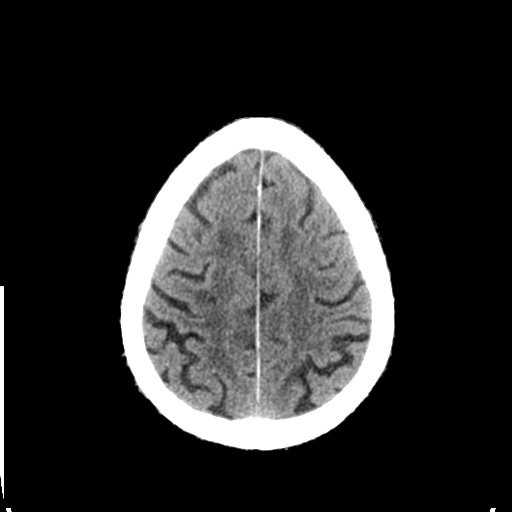
[im 26/32  brain]
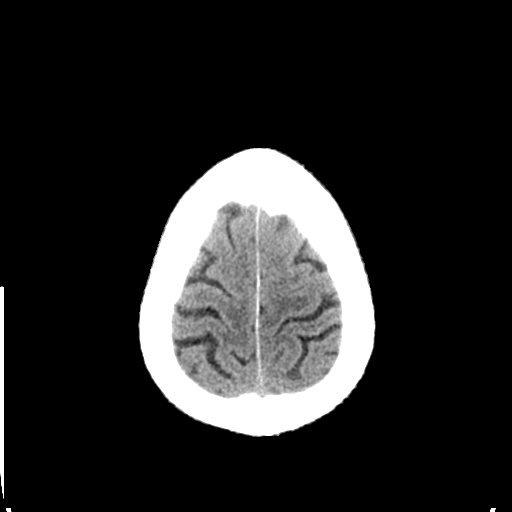
[im 26/32  bone]
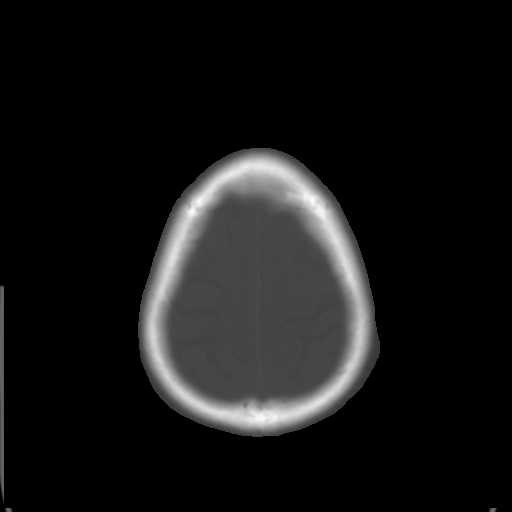
[im 29/32  brain]
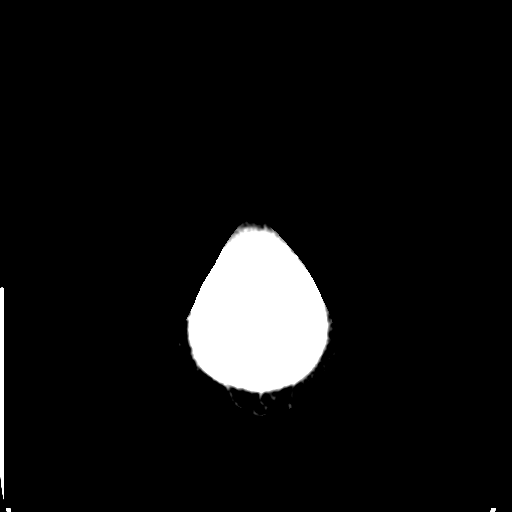

[Series 5: coronal soft tissue · coronal · 0.32mm/px · 3 of 78 slices shown]
[im 26/78  brain]
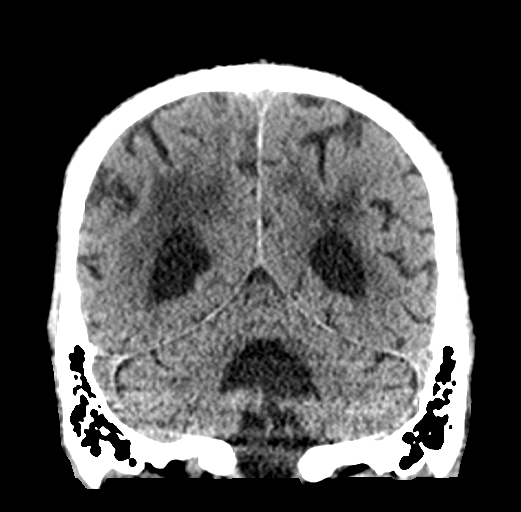
[im 35/78  brain]
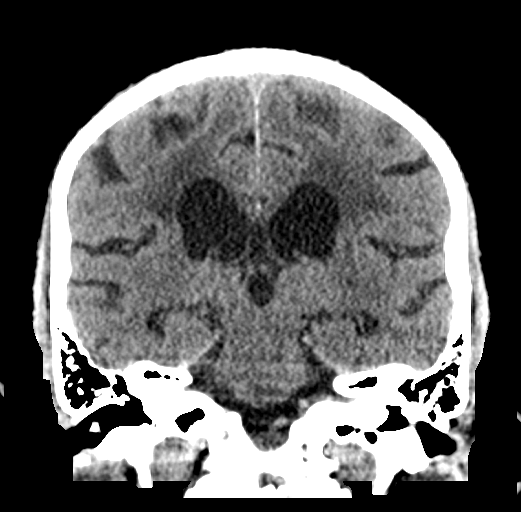
[im 43/78  brain]
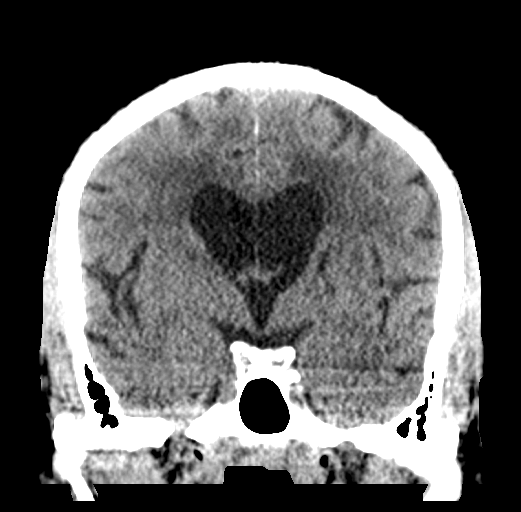

[Series 6: sagittal soft tissue · sagittal · 0.33mm/px · 3 of 60 slices shown]
[im 20/60  brain]
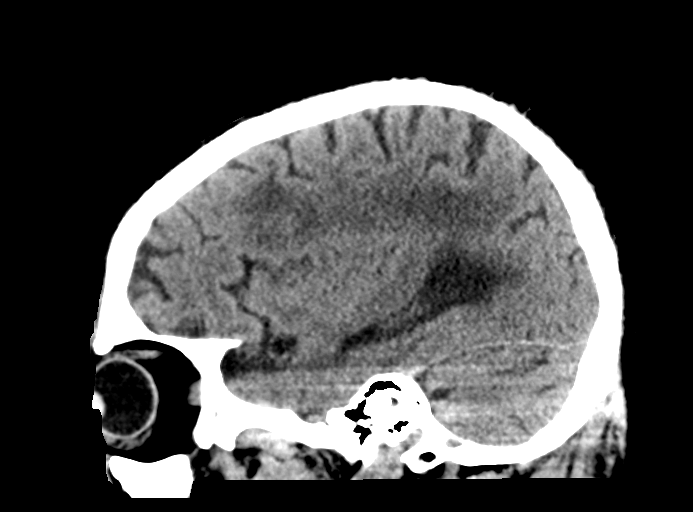
[im 30/60  brain]
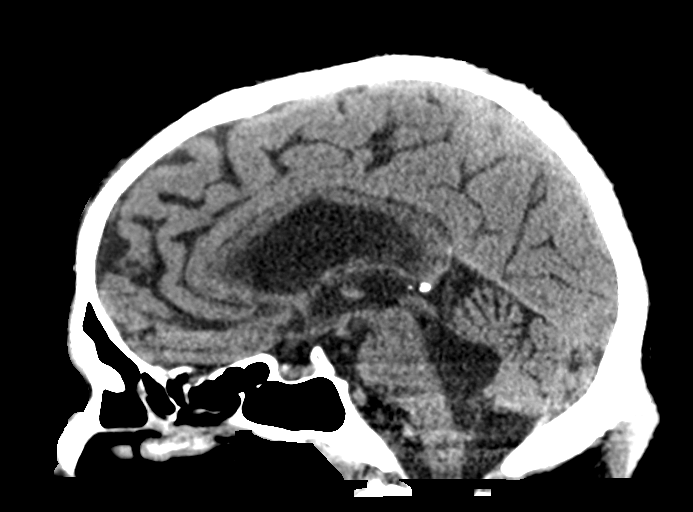
[im 40/60  brain]
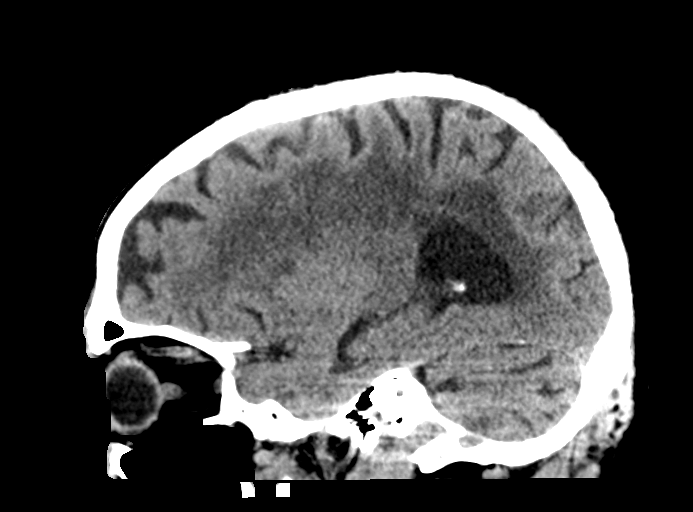

[16 of 47 positions shown; findings below may reference images not displayed]

FINDINGS: Brain: Generalized atrophy. Chronic small-vessel ischemic changes of
the cerebral hemispheric white matter. Old right parieto-occipital
infarction. Old small vessel infarctions of the cerebellum pons. Old
small vessel infarctions of the thalami and basal ganglia. No acute
infarction, mass lesion, hemorrhage, hydrocephalus or extra-axial
collection.

Vascular: There is atherosclerotic calcification of the major
vessels at the base of the brain.

Skull: Negative

Sinuses/Orbits: Clear/normal

Other: None
IMPRESSION: No acute or traumatic finding. Atrophy and chronic ischemic changes
as outlined above.

## 2022-08-04 DEATH — deceased
